# Patient Record
Sex: Female | Born: 1945 | Race: White | Hispanic: No | Smoking: Former smoker
Health system: Southern US, Community
[De-identification: ages and names within clinical notes are randomized; demographics above are authoritative.]

## PROBLEM LIST (undated history)

## (undated) DIAGNOSIS — M810 Age-related osteoporosis without current pathological fracture: Secondary | ICD-10-CM

## (undated) DIAGNOSIS — Z87891 Personal history of nicotine dependence: Secondary | ICD-10-CM

## (undated) DIAGNOSIS — E559 Vitamin D deficiency, unspecified: Secondary | ICD-10-CM

## (undated) DIAGNOSIS — Z780 Asymptomatic menopausal state: Secondary | ICD-10-CM

## (undated) DIAGNOSIS — F319 Bipolar disorder, unspecified: Secondary | ICD-10-CM

## (undated) HISTORY — DX: Age-related osteoporosis without current pathological fracture: M81.0

## (undated) HISTORY — DX: Vitamin D deficiency, unspecified: E55.9

## (undated) HISTORY — DX: Bipolar disorder, unspecified: F31.9

## (undated) HISTORY — DX: Asymptomatic menopausal state: Z78.0

## (undated) HISTORY — PX: TUBAL LIGATION: SHX77

## (undated) HISTORY — DX: Personal history of nicotine dependence: Z87.891

---

## 1993-03-09 DIAGNOSIS — F319 Bipolar disorder, unspecified: Secondary | ICD-10-CM

## 1993-03-09 HISTORY — DX: Bipolar disorder, unspecified: F31.9

## 1999-03-10 DIAGNOSIS — Z87891 Personal history of nicotine dependence: Secondary | ICD-10-CM

## 1999-03-10 HISTORY — DX: Personal history of nicotine dependence: Z87.891

## 2011-06-23 DIAGNOSIS — N390 Urinary tract infection, site not specified: Secondary | ICD-10-CM | POA: Diagnosis not present

## 2011-07-30 DIAGNOSIS — F339 Major depressive disorder, recurrent, unspecified: Secondary | ICD-10-CM | POA: Diagnosis not present

## 2011-07-30 DIAGNOSIS — F3130 Bipolar disorder, current episode depressed, mild or moderate severity, unspecified: Secondary | ICD-10-CM | POA: Diagnosis not present

## 2011-07-31 DIAGNOSIS — F309 Manic episode, unspecified: Secondary | ICD-10-CM | POA: Diagnosis not present

## 2012-03-31 DIAGNOSIS — F3174 Bipolar disorder, in full remission, most recent episode manic: Secondary | ICD-10-CM | POA: Diagnosis not present

## 2012-05-05 DIAGNOSIS — F3174 Bipolar disorder, in full remission, most recent episode manic: Secondary | ICD-10-CM | POA: Diagnosis not present

## 2012-05-20 DIAGNOSIS — F3174 Bipolar disorder, in full remission, most recent episode manic: Secondary | ICD-10-CM | POA: Diagnosis not present

## 2012-06-20 DIAGNOSIS — F3174 Bipolar disorder, in full remission, most recent episode manic: Secondary | ICD-10-CM | POA: Diagnosis not present

## 2012-08-11 DIAGNOSIS — F3174 Bipolar disorder, in full remission, most recent episode manic: Secondary | ICD-10-CM | POA: Diagnosis not present

## 2012-09-07 DIAGNOSIS — F3174 Bipolar disorder, in full remission, most recent episode manic: Secondary | ICD-10-CM | POA: Diagnosis not present

## 2012-12-30 DIAGNOSIS — F3174 Bipolar disorder, in full remission, most recent episode manic: Secondary | ICD-10-CM | POA: Diagnosis not present

## 2013-01-10 DIAGNOSIS — F3174 Bipolar disorder, in full remission, most recent episode manic: Secondary | ICD-10-CM | POA: Diagnosis not present

## 2013-01-10 LAB — COMPREHENSIVE METABOLIC PANEL
ALT: 15 U/L (ref 7–35)
AST: 13 U/L
Albumin: 4.5
Alkaline Phosphatase: 73 U/L
Calcium: 10.5 mg/dL
Creat: 0.88
GLUCOSE: 98
Potassium: 4.8 mmol/L
SODIUM: 140 mmol/L (ref 137–147)
Total Bilirubin: 0.4 mg/dL

## 2013-01-10 LAB — CBC
HEMOGLOBIN: 13.7 g/dL
WBC: 8.5
platelet count: 391

## 2013-01-10 LAB — LITHIUM LEVEL: Lithium Lvl: 1

## 2013-01-10 LAB — T4: Thyroxine (T4): 8.4

## 2013-01-10 LAB — TSH: TSH: 4.77

## 2013-01-13 DIAGNOSIS — Z23 Encounter for immunization: Secondary | ICD-10-CM | POA: Diagnosis not present

## 2013-03-09 DIAGNOSIS — M810 Age-related osteoporosis without current pathological fracture: Secondary | ICD-10-CM

## 2013-03-09 HISTORY — DX: Age-related osteoporosis without current pathological fracture: M81.0

## 2013-03-24 DIAGNOSIS — F3174 Bipolar disorder, in full remission, most recent episode manic: Secondary | ICD-10-CM | POA: Diagnosis not present

## 2013-03-27 ENCOUNTER — Encounter: Payer: Self-pay | Admitting: Family Medicine

## 2013-05-16 ENCOUNTER — Encounter: Payer: Self-pay | Admitting: Family Medicine

## 2013-05-16 ENCOUNTER — Ambulatory Visit (INDEPENDENT_AMBULATORY_CARE_PROVIDER_SITE_OTHER): Payer: Medicare Other | Admitting: Family Medicine

## 2013-05-16 VITALS — BP 124/86 | HR 84 | Temp 98.8°F | Ht 65.0 in | Wt 152.8 lb

## 2013-05-16 DIAGNOSIS — M810 Age-related osteoporosis without current pathological fracture: Secondary | ICD-10-CM | POA: Diagnosis not present

## 2013-05-16 DIAGNOSIS — R946 Abnormal results of thyroid function studies: Secondary | ICD-10-CM | POA: Diagnosis not present

## 2013-05-16 DIAGNOSIS — Z87891 Personal history of nicotine dependence: Secondary | ICD-10-CM | POA: Diagnosis not present

## 2013-05-16 DIAGNOSIS — F3174 Bipolar disorder, in full remission, most recent episode manic: Secondary | ICD-10-CM | POA: Insufficient documentation

## 2013-05-16 DIAGNOSIS — F319 Bipolar disorder, unspecified: Secondary | ICD-10-CM

## 2013-05-16 LAB — T4, FREE: FREE T4: 0.75 ng/dL (ref 0.60–1.60)

## 2013-05-16 LAB — TSH: TSH: 1.31 u[IU]/mL (ref 0.35–5.50)

## 2013-05-16 NOTE — Assessment & Plan Note (Signed)
Recheck levels today.  Possibly lithium related.

## 2013-05-16 NOTE — Progress Notes (Signed)
Pre visit review using our clinic review tool, if applicable. No additional management support is needed unless otherwise documented below in the visit note. 

## 2013-05-16 NOTE — Patient Instructions (Addendum)
Good to meet you today, call us with questions. Return in 3-4 months for wellness exam, prior fasting for blood work. Blood work today - to follow thyroid and calcium levels.  Try to get most or all of your calcium from your food--aim for 1000 mg/day for women up to 50 and men up to 70 and 1200 mg/day for women over 50 and men over 70. To figure out dietary calcium: 300 mg/day from all non dairy foods plus 300 mg per cup of milk, other dairy, or fortified juice. Non dairy foods that contain calcium: Kale, oranges, sardines, oatmeal, soy milk/soybeans, salmon, white beans, dried figs, turnip greens, almonds, broccoli, tofu.

## 2013-05-16 NOTE — Assessment & Plan Note (Signed)
Reviewed rec cal/vit D daily. Pt hesitant for meds to treat osteoporosis 2/2 side effects. Will obtain latest dexa done ~2010 and further discuss next OV.

## 2013-05-16 NOTE — Progress Notes (Signed)
BP 124/86  Pulse 84  Temp(Src) 98.8 F (37.1 C) (Oral)  Ht 5\' 5"  (1.651 m)  Wt 152 lb 12.8 oz (69.31 kg)  BMI 25.43 kg/m2   CC: new pt to establish   Subjective:    Patient ID: Rachel Macias, female    DOB: [birth year], 68 y.o.   MRN: [id number]  HPI: Rachel Macias is a 68 y.o. female presenting on 05/16/2013 with Establish Care   Prior saw psych Dr. Blaine HamperWeinstein in [place] for 20 years - was on cytomel per him.  Then recently started seeing Dr. Maryruth BunKapur fall 2013.  H/o bipolar 1 disorder on lithium and prozac.  Q6 mo blood work.  Denies diarrhea/constipatio, skin or hair changes, heat or cold intolerance, unexpected weight changes.  No HTN.  Osteoporosis - prior on boniva x1 year.  Did not tolerate fosamax 2/2 GI distress.  Last DEXA ~2010.  Husband passed away of brain cancer 2010 - she was sole caregiver for 18 months.  Lives alone with 2 dogs, widow 30 yrs ago son died at age 68yo (hit by car). 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  Allergies and medications reviewed and updated. No current outpatient prescriptions on file prior to visit.   No current facility-administered medications on file prior to visit.    Review of Systems Per HPI unless specifically indicated above    Objective:    BP 124/86  Pulse 84  Temp(Src) 98.8 F (37.1 C) (Oral)  Ht 5\' 5"  (1.651 m)  Wt 152 lb 12.8 oz (69.31 kg)  BMI 25.43 kg/m2  Physical Exam  Nursing note and vitals reviewed. Constitutional: She is oriented to person, place, and time. She appears well-developed and well-nourished. No distress.  HENT:  Head: Normocephalic and  atraumatic.  Right Ear: Hearing, tympanic membrane and ear canal normal.  Left Ear: Hearing, tympanic membrane and ear canal normal.  Mouth/Throat: Uvula is midline, oropharynx is clear and moist and mucous membranes are normal. No oropharyngeal exudate, posterior oropharyngeal edema or posterior oropharyngeal erythema.  Eyes: Conjunctivae and EOM are normal. Pupils are equal, round, and reactive to light. No scleral icterus.  Neck: Normal range of motion. Neck supple. No thyromegaly present.  Cardiovascular: Normal rate, regular rhythm, normal heart sounds and intact distal pulses.   No murmur heard. Pulses:      Radial pulses are 2+ on the right side, and 2+ on the left side.  Pulmonary/Chest: Effort normal and breath sounds normal. No respiratory distress. She has no wheezes. She has no rales.  Musculoskeletal: Normal range of motion. She exhibits no edema.  Lymphadenopathy:    She has no cervical adenopathy.  Neurological: She is alert and oriented to person, place, and time.  CN grossly intact, station and gait intact  Skin: Skin is warm and dry. No rash noted.  Psychiatric: She has a normal mood and affect. Her behavior is normal. Judgment and thought content normal.   No results found for this or any previous visit.    Assessment & Plan:   Problem List Items Addressed This Visit   Bipolar 1 disorder     Chronic, stable. Continue to f/u with psych Maryruth Bun([place]).  Ex-smoker   Osteoporosis, unspecified     Reviewed rec cal/vit D daily. Pt hesitant for meds to treat osteoporosis 2/2 side effects. Will obtain latest dexa done ~2010 and further discuss next OV.    Relevant Medications      cholecalciferol (VITAMIN D) 1000 UNITS tablet   Other Relevant Orders      Vit D  25 hydroxy (rtn osteoporosis monitoring)   Thyroid function test abnormal - Primary     Recheck levels today.  Possibly lithium related.    Relevant Orders      TSH      T4, Free      T3    Other Visit  Diagnoses   Hypercalcemia        Relevant Orders       PTH, Intact and Calcium        Follow up plan: No Follow-up on file.

## 2013-05-16 NOTE — Assessment & Plan Note (Signed)
Chronic, stable. Continue to f/u with psych Maryruth Bun([place]).

## 2013-05-17 LAB — PTH, INTACT AND CALCIUM
Calcium: 9.6 mg/dL (ref 8.4–10.5)
PTH: 110.4 pg/mL — AB (ref 14.0–72.0)

## 2013-05-17 LAB — T3: T3, Total: 122 ng/dL (ref 80.0–204.0)

## 2013-05-17 LAB — VITAMIN D 25 HYDROXY (VIT D DEFICIENCY, FRACTURES): Vit D, 25-Hydroxy: 21 ng/mL — ABNORMAL LOW (ref 30–89)

## 2013-05-18 ENCOUNTER — Other Ambulatory Visit: Payer: Self-pay | Admitting: Family Medicine

## 2013-05-18 ENCOUNTER — Encounter: Payer: Self-pay | Admitting: Family Medicine

## 2013-05-18 ENCOUNTER — Encounter: Payer: Self-pay | Admitting: *Deleted

## 2013-05-18 DIAGNOSIS — E559 Vitamin D deficiency, unspecified: Secondary | ICD-10-CM

## 2013-05-18 MED ORDER — VITAMIN D 1000 UNITS PO TABS: 2000.0000 [IU] | ORAL_TABLET | Freq: Every day | ORAL | Status: AC

## 2013-06-26 ENCOUNTER — Encounter: Payer: Self-pay | Admitting: *Deleted

## 2013-07-05 DIAGNOSIS — F3174 Bipolar disorder, in full remission, most recent episode manic: Secondary | ICD-10-CM | POA: Diagnosis not present

## 2013-07-06 ENCOUNTER — Encounter: Payer: Self-pay | Admitting: Family Medicine

## 2013-08-12 ENCOUNTER — Other Ambulatory Visit: Payer: Self-pay | Admitting: Family Medicine

## 2013-08-12 DIAGNOSIS — F319 Bipolar disorder, unspecified: Secondary | ICD-10-CM

## 2013-08-12 DIAGNOSIS — Z136 Encounter for screening for cardiovascular disorders: Secondary | ICD-10-CM

## 2013-08-12 DIAGNOSIS — R946 Abnormal results of thyroid function studies: Secondary | ICD-10-CM

## 2013-08-12 DIAGNOSIS — E559 Vitamin D deficiency, unspecified: Secondary | ICD-10-CM

## 2013-08-15 ENCOUNTER — Other Ambulatory Visit (INDEPENDENT_AMBULATORY_CARE_PROVIDER_SITE_OTHER): Payer: Medicare Other

## 2013-08-15 DIAGNOSIS — F319 Bipolar disorder, unspecified: Secondary | ICD-10-CM

## 2013-08-15 DIAGNOSIS — M810 Age-related osteoporosis without current pathological fracture: Secondary | ICD-10-CM | POA: Diagnosis not present

## 2013-08-15 DIAGNOSIS — E559 Vitamin D deficiency, unspecified: Secondary | ICD-10-CM

## 2013-08-15 DIAGNOSIS — R946 Abnormal results of thyroid function studies: Secondary | ICD-10-CM

## 2013-08-15 DIAGNOSIS — Z136 Encounter for screening for cardiovascular disorders: Secondary | ICD-10-CM

## 2013-08-15 LAB — LIPID PANEL
CHOL/HDL RATIO: 7
CHOLESTEROL: 284 mg/dL — AB (ref 0–200)
HDL: 39.8 mg/dL (ref >39.00)
LDL Cholesterol: 202 mg/dL — ABNORMAL HIGH (ref 0–99)
NonHDL: 244.2
Triglycerides: 211 mg/dL — ABNORMAL HIGH (ref 0.0–149.0)
VLDL: 42.2 mg/dL — ABNORMAL HIGH (ref 0.0–40.0)

## 2013-08-15 LAB — BASIC METABOLIC PANEL
BUN: 13 mg/dL (ref 6–23)
CHLORIDE: 107 meq/L (ref 96–112)
CO2: 26 meq/L (ref 19–32)
CREATININE: 0.9 mg/dL (ref 0.4–1.2)
Calcium: 9.7 mg/dL (ref 8.4–10.5)
GFR: 62.95 mL/min (ref >60.00)
GLUCOSE: 95 mg/dL (ref 70–99)
Potassium: 4.2 mEq/L (ref 3.5–5.1)
Sodium: 140 mEq/L (ref 135–145)

## 2013-08-15 LAB — VITAMIN D 25 HYDROXY (VIT D DEFICIENCY, FRACTURES): VITD: 29.95 ng/mL

## 2013-08-15 NOTE — Addendum Note (Signed)
Addended by: Alvina Chou on: 08/15/2013 02:17 PM   Modules accepted: Orders

## 2013-08-18 LAB — LITHIUM LEVEL: Lithium Lvl: 0.7 mEq/L — ABNORMAL LOW (ref 0.80–1.40)

## 2013-08-22 ENCOUNTER — Encounter: Payer: Self-pay | Admitting: Family Medicine

## 2013-08-22 ENCOUNTER — Ambulatory Visit (INDEPENDENT_AMBULATORY_CARE_PROVIDER_SITE_OTHER): Payer: Medicare Other | Admitting: Family Medicine

## 2013-08-22 VITALS — BP 124/82 | HR 56 | Temp 98.7°F | Ht 65.0 in | Wt 149.5 lb

## 2013-08-22 DIAGNOSIS — Z1211 Encounter for screening for malignant neoplasm of colon: Secondary | ICD-10-CM | POA: Diagnosis not present

## 2013-08-22 DIAGNOSIS — Z1231 Encounter for screening mammogram for malignant neoplasm of breast: Secondary | ICD-10-CM | POA: Diagnosis not present

## 2013-08-22 DIAGNOSIS — F319 Bipolar disorder, unspecified: Secondary | ICD-10-CM

## 2013-08-22 DIAGNOSIS — Z0000 Encounter for general adult medical examination without abnormal findings: Secondary | ICD-10-CM | POA: Diagnosis not present

## 2013-08-22 DIAGNOSIS — Z23 Encounter for immunization: Secondary | ICD-10-CM | POA: Diagnosis not present

## 2013-08-22 DIAGNOSIS — M810 Age-related osteoporosis without current pathological fracture: Secondary | ICD-10-CM

## 2013-08-22 DIAGNOSIS — R946 Abnormal results of thyroid function studies: Secondary | ICD-10-CM

## 2013-08-22 NOTE — Assessment & Plan Note (Signed)
Lab Results  Component Value Date   TSH 1.31 05/16/2013

## 2013-08-22 NOTE — Progress Notes (Signed)
BP 124/82  Pulse 56  Temp(Src) 98.7 F (37.1 C) (Oral)  Ht '5\' 5"'  (1.651 m)  Wt 149 lb 8 oz (67.813 kg)  BMI 24.88 kg/m2   CC: medicare wellness visit  Subjective:    Patient ID: Rachel Macias, female    DOB: 05-Apr-1945, 68 y.o.   MRN: [id number]  HPI: Alyona Romack is a 68 y.o. female presenting on 08/22/2013 for Annual Exam   Prior doctor was NP at [facility] in [place].  H/o bipolar 1 disorder on lithium and prozac. Failed transition of litium to [place] continued lithium use. Q6 mo blood work.   Osteoporosis - prior on boniva x1 year. Did not tolerate fosamax 2/2 GI distress. Last DEXA <2010.   Hearing screen passed. Vision screen passed. No falls in last year Denies depression, anhedonia.  Preventative:  Colon cancer screening - no fmhx colon cancer. Discussed this. Hesitant for colonoscopy. interested in stool kit. 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 Would like to schedule at our office in future. May do one more then age out.   mammo 2000 - would like scheduled DEXA 05-26-2008 - would like scheduled Flu shot this year done prevnar - today. zostavax - discussed G1P1 (son died at age 58yo), LMP 39 (age 32) Advanced directives - Has at home. HCPOA not set up, would consider sister.  Husband passed away of brain cancer May 26, 2008 - she was sole caregiver for 18 months.  Lives alone with 2 dogs, widow  50 yrs ago son died at age 52yo (hit by car).  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  Occupation: retired, used to work for [employer] ([place])  Edu: 13 yrs  Activity: no regular exercise  Diet: good water, fruits/vegetables some   Relevant past medical, surgical, family and social history reviewed and updated as indicated.  Allergies and medications reviewed and updated. Current Outpatient Prescriptions on File Prior to Visit  Medication Sig  . cholecalciferol (VITAMIN D) 1000 UNITS tablet Take 2 tablets (2,000 Units  total) by mouth daily.  Marland Kitchen FLUoxetine (PROZAC) 20 MG capsule Take 20 mg by mouth daily.  Marland Kitchen lithium 300 MG tablet Take 900 mg by mouth daily.   No current facility-administered medications on file prior to visit.    Review of Systems Per HPI unless specifically indicated above    Objective:    BP 124/82  Pulse 56  Temp(Src) 98.7 F (37.1 C) (Oral)  Ht '5\' 5"'  (1.651 m)  Wt 149 lb 8 oz (67.813 kg)  BMI 24.88 kg/m2  Physical Exam  Nursing note and vitals reviewed. Constitutional: She is oriented to person, place, and time. She appears well-developed and well-nourished. No distress.  HENT:  Head: Normocephalic and atraumatic.  Right Ear: Hearing, tympanic membrane, external ear and ear canal normal.  Left Ear: Hearing, tympanic membrane, external ear and ear canal normal.  Nose: Nose normal.  Mouth/Throat: Uvula is midline, oropharynx is clear and moist and mucous membranes are normal. No oropharyngeal exudate, posterior oropharyngeal edema or posterior oropharyngeal erythema.  Eyes: Conjunctivae and EOM are normal. Pupils are equal, round, and reactive to light. No scleral icterus.  Neck: Normal range of motion. Neck supple. Carotid bruit is not present. No thyromegaly present.  Cardiovascular: Normal rate, regular rhythm, normal heart sounds and intact distal pulses.   No murmur heard. Pulses:      Radial pulses are 2+ on the right side, and 2+ on the left  side.  Pulmonary/Chest: Effort normal and breath sounds normal. No respiratory distress. She has no wheezes. She has no rales.  Abdominal: Soft. Bowel sounds are normal. She exhibits no distension and no mass. There is no tenderness. There is no rebound and no guarding.  Musculoskeletal: Normal range of motion. She exhibits no edema.  Lymphadenopathy:    She has no cervical adenopathy.  Neurological: She is alert and oriented to person, place, and time.  CN grossly intact, station and gait intact Recall 3/3 Calculation 5/5  serial 7s  Skin: Skin is warm and dry. No rash noted.  Psychiatric: She has a normal mood and affect. Her behavior is normal. Judgment and thought content normal.   Results for orders placed in visit on 08/15/13  LIPID PANEL      Result Value Ref Range   Cholesterol 284 (*) 0 - 200 mg/dL   Triglycerides 211.0 (*) 0.0 - 149.0 mg/dL   HDL 39.80  >39.00 mg/dL   VLDL 42.2 (*) 0.0 - 40.0 mg/dL   LDL Cholesterol 202 (*) 0 - 99 mg/dL   Total CHOL/HDL Ratio 7     NonHDL 735.32    BASIC METABOLIC PANEL      Result Value Ref Range   Sodium 140  135 - 145 mEq/L   Potassium 4.2  3.5 - 5.1 mEq/L   Chloride 107  96 - 112 mEq/L   CO2 26  19 - 32 mEq/L   Glucose, Bld 95  70 - 99 mg/dL   BUN 13  6 - 23 mg/dL   Creatinine, Ser 0.9  0.4 - 1.2 mg/dL   Calcium 9.7  8.4 - 10.5 mg/dL   GFR 62.95  >60.00 mL/min  VITAMIN D 25 HYDROXY      Result Value Ref Range   VITD 29.95    LITHIUM LEVEL      Result Value Ref Range   Lithium Lvl 0.70 (*) 0.80 - 1.40 mEq/L      Assessment & Plan:   Problem List Items Addressed This Visit   Thyroid function test abnormal      Lab Results  Component Value Date   TSH 1.31 05/16/2013      Osteoporosis, unspecified     Will schedule DEXA Continue vit D daily.    Relevant Orders      DG Bone Density   Medicare annual wellness visit, subsequent - Primary     I have personally reviewed the Medicare Annual Wellness questionnaire and have noted 1. The patient's medical and social history 2. Their use of alcohol, tobacco or illicit drugs 3. Their current medications and supplements 4. The patient's functional ability including ADL's, fall risks, home safety risks and hearing or visual impairment. 5. Diet and physical activity 6. Evidence for depression or mood disorders The patients weight, height, BMI have been recorded in the chart.  Hearing and vision has been addressed. I have made referrals, counseling and provided education to the patient based review of  the above and I have provided the pt with a written personalized care plan for preventive services. See scanned questionairre. Advanced directives discussed: has at home, thinks would want sister to be HCPOA but not formalized.  Reviewed preventative protocols and updated unless pt declined. Pt will return for well woman exam at her convenience. iFOB today.    Bipolar 1 disorder     Stable on lithium and prozac.  continue meds. Continue f/u with Dr. Nicolasa Ducking. Lithium level 0.7 today - good  range.     Other Visit Diagnoses   Other screening mammogram        Relevant Orders       MM DIGITAL SCREENING BILATERAL    Special screening for malignant neoplasms, colon        Relevant Orders       Fecal occult blood, imunochemical        Follow up plan: Return in about 1 year (around 08/23/2014), or as needed, for annual exam, prior fasting for blood work.

## 2013-08-22 NOTE — Addendum Note (Signed)
Addended by: Josph MachoANCE, KIMBERLY A on: 08/22/2013 11:35 AM   Modules accepted: Orders

## 2013-08-22 NOTE — Assessment & Plan Note (Addendum)
I have personally reviewed the Medicare Annual Wellness questionnaire and have noted 1. The patient's medical and social history 2. Their use of alcohol, tobacco or illicit drugs 3. Their current medications and supplements 4. The patient's functional ability including ADL's, fall risks, home safety risks and hearing or visual impairment. 5. Diet and physical activity 6. Evidence for depression or mood disorders The patients weight, height, BMI have been recorded in the chart.  Hearing and vision has been addressed. I have made referrals, counseling and provided education to the patient based review of the above and I have provided the pt with a written personalized care plan for preventive services. See scanned questionairre. Advanced directives discussed: has at home, thinks would want sister to be HCPOA but not formalized.  Reviewed preventative protocols and updated unless pt declined. Pt will return for well woman exam at her convenience. iFOB today.

## 2013-08-22 NOTE — Progress Notes (Signed)
Pre visit review using our clinic review tool, if applicable. No additional management support is needed unless otherwise documented below in the visit note. 

## 2013-08-22 NOTE — Assessment & Plan Note (Signed)
Stable on lithium and prozac.  continue meds. Continue f/u with Dr. Maryruth BunKapur. Lithium level 0.7 today - good range.

## 2013-08-22 NOTE — Assessment & Plan Note (Signed)
Will schedule DEXA Continue vit D daily.

## 2013-08-22 NOTE — Patient Instructions (Addendum)
Prevnar today. Low cholesterol diet handout provided today Call your insurance about the shingles shot to see if it is covered or how much it would cost and where is cheaper (here or pharmacy).  If you want to receive here, call for nurse visit. Sign release form for [facility] so we can get copies of bone density scan. We will schedule you for a mammogram and bone density scan (pass by Marion's office if she's available today). Stool kit today. Schedule well woman exam at your convenience. Good to see you today, call us with questions.

## 2013-09-03 ENCOUNTER — Encounter: Payer: Self-pay | Admitting: Family Medicine

## 2013-09-04 ENCOUNTER — Other Ambulatory Visit: Payer: Medicare Other

## 2013-09-04 DIAGNOSIS — Z1211 Encounter for screening for malignant neoplasm of colon: Secondary | ICD-10-CM

## 2013-09-04 LAB — FECAL OCCULT BLOOD, IMMUNOCHEMICAL: FECAL OCCULT BLD: NEGATIVE

## 2013-09-05 ENCOUNTER — Encounter: Payer: Self-pay | Admitting: *Deleted

## 2013-09-06 HISTORY — PX: OTHER SURGICAL HISTORY: SHX169

## 2013-09-14 ENCOUNTER — Ambulatory Visit: Payer: Self-pay | Admitting: Family Medicine

## 2013-09-14 ENCOUNTER — Encounter: Payer: Self-pay | Admitting: Family Medicine

## 2013-09-14 DIAGNOSIS — M899 Disorder of bone, unspecified: Secondary | ICD-10-CM | POA: Diagnosis not present

## 2013-09-14 DIAGNOSIS — Z1231 Encounter for screening mammogram for malignant neoplasm of breast: Secondary | ICD-10-CM | POA: Diagnosis not present

## 2013-09-14 DIAGNOSIS — M949 Disorder of cartilage, unspecified: Secondary | ICD-10-CM | POA: Diagnosis not present

## 2013-09-14 DIAGNOSIS — M810 Age-related osteoporosis without current pathological fracture: Secondary | ICD-10-CM | POA: Diagnosis not present

## 2013-09-14 LAB — HM MAMMOGRAPHY: HM MAMMO: NORMAL

## 2013-09-15 ENCOUNTER — Encounter: Payer: Self-pay | Admitting: *Deleted

## 2013-09-17 ENCOUNTER — Encounter: Payer: Self-pay | Admitting: Family Medicine

## 2013-09-22 ENCOUNTER — Encounter: Payer: Self-pay | Admitting: Family Medicine

## 2013-09-22 ENCOUNTER — Ambulatory Visit (INDEPENDENT_AMBULATORY_CARE_PROVIDER_SITE_OTHER): Payer: Medicare Other | Admitting: Family Medicine

## 2013-09-22 VITALS — BP 126/84 | HR 72 | Temp 98.7°F | Wt 151.5 lb

## 2013-09-22 DIAGNOSIS — F319 Bipolar disorder, unspecified: Secondary | ICD-10-CM | POA: Diagnosis not present

## 2013-09-22 DIAGNOSIS — M810 Age-related osteoporosis without current pathological fracture: Secondary | ICD-10-CM | POA: Diagnosis not present

## 2013-09-22 NOTE — Progress Notes (Signed)
Pre visit review using our clinic review tool, if applicable. No additional management support is needed unless otherwise documented below in the visit note. 

## 2013-09-22 NOTE — Assessment & Plan Note (Addendum)
Discussed concerns with mental healthcare. Support provided. A total of 25 minutes were spent face-to-face with the patient during this encounter and over half of that time was spent on counseling and coordination of care

## 2013-09-22 NOTE — Assessment & Plan Note (Signed)
Completed ~1 mo bisphosphonate therapy. Pending dental work. Will defer osteoporosis treatment until completed dental work then pt would consider further treatment. Discussed different options - pt desires to avoid oral bisphosphonates or any hormonal treatments. Not interested in daily injection. Would consider prolia or IV bsiphosphonate. Reviewed calcium/vit D recommendations and reviewed dietary ways to reach goal. Requests to further discuss at future visits.

## 2013-09-22 NOTE — Patient Instructions (Signed)
Check on dose of calcium in antacids you take. Try to get most or all of your calcium from your food--aim for 1200 mg/day for women over 50 and men over 70. To figure out dietary calcium: 300 mg/day from all non dairy foods plus 300 mg per cup of milk, other dairy, or fortified juice. Non dairy foods that contain calcium: Kale, oranges, sardines, oatmeal, soy milk/soybeans, salmon, white beans, dried figs, turnip greens, almonds, broccoli, tofu.  If we're not reaching calcium goal per day in diet, would consider adding calcium supplement (Calcium citrate) 600mg  + vit D 400 units.  Look into chewable calcium supplements.  Check with dentist about prolia.   Osteoporosis Throughout your life, your body breaks down old bone and replaces it with new bone. As you get older, your body does not replace bone as quickly as it breaks it down. By the age of 30 years, most people begin to gradually lose bone because of the imbalance between bone loss and replacement. Some people lose more bone than others. Bone loss beyond a specified normal degree is considered osteoporosis.  Osteoporosis affects the strength and durability of your bones. The inside of the ends of your bones and your flat bones, like the bones of your pelvis, look like honeycomb, filled with tiny open spaces. As bone loss occurs, your bones become less dense. This means that the open spaces inside your bones become bigger and the walls between these spaces become thinner. This makes your bones weaker. Bones of a person with osteoporosis can become so weak that they can break (fracture) during minor accidents, such as a simple fall. CAUSES  The following factors have been associated with the development of osteoporosis:  Smoking.  Drinking more than 2 alcoholic drinks several days per week.  Long-term use of certain medicines:  Corticosteroids.  Chemotherapy medicines.  Thyroid medicines.  Antiepileptic medicines.  Gonadal  hormone suppression medicine.  Immunosuppression medicine.  Being underweight.  Lack of physical activity.  Lack of exposure to the sun. This can lead to vitamin D deficiency.  Certain medical conditions:  Certain inflammatory bowel diseases, such as Crohn disease and ulcerative colitis.  Diabetes.  Hyperthyroidism.  Hyperparathyroidism. RISK FACTORS Anyone can develop osteoporosis. However, the following factors can increase your risk of developing osteoporosis:  Gender--Women are at higher risk than men.  Age--Being older than 50 years increases your risk.  Ethnicity--White and Asian people have an increased risk.  Weight --Being extremely underweight can increase your risk of osteoporosis.  Family history of osteoporosis--Having a family member who has developed osteoporosis can increase your risk. SYMPTOMS  Usually, people with osteoporosis have no symptoms.  DIAGNOSIS  Signs during a physical exam that may prompt your caregiver to suspect osteoporosis include:  Decreased height. This is usually caused by the compression of the bones that form your spine (vertebrae) because they have weakened and become fractured.  A curving or rounding of the upper back (kyphosis). To confirm signs of osteoporosis, your caregiver may request a procedure that uses 2 low-dose X-ray beams with different levels of energy to measure your bone mineral density (dual-energy X-ray absorptiometry [DXA]). Also, your caregiver may check your level of vitamin D. TREATMENT  The goal of osteoporosis treatment is to strengthen bones in order to decrease the risk of bone fractures. There are different types of medicines available to help achieve this goal. Some of these medicines work by slowing the processes of bone loss. Some medicines work by increasing bone density.  Treatment also involves making sure that your levels of calcium and vitamin D are adequate. PREVENTION  There are things you can do to  help prevent osteoporosis. Adequate intake of calcium and vitamin D can help you achieve optimal bone mineral density. Regular exercise can also help, especially resistance and weight-bearing activities. If you smoke, quitting smoking is an important part of osteoporosis prevention. MAKE SURE YOU:  Understand these instructions.  Will watch your condition.  Will get help right away if you are not doing well or get worse. FOR MORE INFORMATION www.osteo.org and [employer] Document Released: 12/03/2004 Document Revised: 06/20/2012 Document Reviewed: 02/07/2011 [facility] Patient Information 2015 [place], [place]. This information is not intended to replace advice given to you by your health care provider. Make sure you discuss any questions you have with your health care provider.

## 2013-09-22 NOTE — Progress Notes (Signed)
   BP 126/84  Pulse 72  Temp(Src) 98.7 F (37.1 C) (Oral)  Wt 151 lb 8 oz (68.72 kg)   CC: discuss osteoporosis  Subjective:    Patient ID: Stephannie PetersJane Wurth, female    DOB: [birth year], 68 y.o.   MRN: [id number]  HPI: Stephannie PetersJane Roughton is a 68 y.o. female presenting on 09/22/2013 for Follow-up   Presents today to discuss osteoporosis options.  Osteoporosis - h/o fosamax GI reaction, then was on boniva IV Q4090mo for ~1 yr.  Doesn't remember dates of first DEXA or of treatment dates.  DEXA Date: 09/2013 T -2.8 femur.   Dental work planned in next 2 weeks so hesitant for any bisphosphonate.   Compliant with vit D 1000 IU daily.  Last vit d check 29.95 (08/2013).   Also discussed mental healthcare concerns.  Relevant past medical, surgical, family and social history reviewed and updated as indicated.  Allergies and medications reviewed and updated. Current Outpatient Prescriptions on File Prior to Visit  Medication Sig  . cholecalciferol (VITAMIN D) 1000 UNITS tablet Take 2 tablets (2,000 Units total) by mouth daily.  Marland Kitchen. FLUoxetine (PROZAC) 20 MG capsule Take 20 mg by mouth daily.  Marland Kitchen. lithium 300 MG tablet Take 900 mg by mouth daily.   No current facility-administered medications on file prior to visit.    Review of Systems Per HPI unless specifically indicated above    Objective:    BP 126/84  Pulse 72  Temp(Src) 98.7 F (37.1 C) (Oral)  Wt 151 lb 8 oz (68.72 kg)  Physical Exam  Nursing note and vitals reviewed. Constitutional: She appears well-developed and well-nourished. No distress.  Psychiatric: She has a normal mood and affect.       Assessment & Plan:   Problem List Items Addressed This Visit   Osteoporosis, unspecified - Primary     Completed ~1 mo bisphosphonate therapy. Pending dental work. Will defer osteoporosis treatment until completed dental work then pt would consider further treatment. Discussed different options - pt desires to avoid oral bisphosphonates or any  hormonal treatments. Not interested in daily injection. Would consider prolia or IV bsiphosphonate. Reviewed calcium/vit D recommendations and reviewed dietary ways to reach goal. Requests to further discuss at future visits.    Bipolar 1 disorder     Discussed concerns with mental healthcare. Support provided. A total of 25 minutes were spent face-to-face with the patient during this encounter and over half of that time was spent on counseling and coordination of care         Follow up plan: Return as needed.

## 2013-09-28 DIAGNOSIS — F3174 Bipolar disorder, in full remission, most recent episode manic: Secondary | ICD-10-CM | POA: Diagnosis not present

## 2013-12-28 DIAGNOSIS — F3174 Bipolar disorder, in full remission, most recent episode manic: Secondary | ICD-10-CM | POA: Diagnosis not present

## 2014-03-06 DIAGNOSIS — F3174 Bipolar disorder, in full remission, most recent episode manic: Secondary | ICD-10-CM | POA: Diagnosis not present

## 2014-03-20 DIAGNOSIS — F3174 Bipolar disorder, in full remission, most recent episode manic: Secondary | ICD-10-CM | POA: Diagnosis not present

## 2014-06-25 DIAGNOSIS — F3174 Bipolar disorder, in full remission, most recent episode manic: Secondary | ICD-10-CM | POA: Diagnosis not present

## 2014-07-02 ENCOUNTER — Encounter: Payer: Self-pay | Admitting: Family Medicine

## 2014-08-27 ENCOUNTER — Other Ambulatory Visit: Payer: Self-pay | Admitting: Family Medicine

## 2014-08-27 ENCOUNTER — Other Ambulatory Visit (INDEPENDENT_AMBULATORY_CARE_PROVIDER_SITE_OTHER): Payer: Medicare Other

## 2014-08-27 DIAGNOSIS — E559 Vitamin D deficiency, unspecified: Secondary | ICD-10-CM

## 2014-08-27 DIAGNOSIS — F3174 Bipolar disorder, in full remission, most recent episode manic: Secondary | ICD-10-CM

## 2014-08-27 DIAGNOSIS — E213 Hyperparathyroidism, unspecified: Secondary | ICD-10-CM

## 2014-08-27 DIAGNOSIS — E785 Hyperlipidemia, unspecified: Secondary | ICD-10-CM

## 2014-08-27 DIAGNOSIS — R946 Abnormal results of thyroid function studies: Secondary | ICD-10-CM

## 2014-08-27 LAB — LIPID PANEL
Cholesterol: 257 mg/dL — ABNORMAL HIGH (ref 0–200)
HDL: 39.7 mg/dL (ref >39.00)
NONHDL: 217.3
Total CHOL/HDL Ratio: 6
Triglycerides: 224 mg/dL — ABNORMAL HIGH (ref 0.0–149.0)
VLDL: 44.8 mg/dL — AB (ref 0.0–40.0)

## 2014-08-27 LAB — BASIC METABOLIC PANEL
BUN: 13 mg/dL (ref 6–23)
CO2: 27 meq/L (ref 19–32)
CREATININE: 0.9 mg/dL (ref 0.40–1.20)
Calcium: 10.1 mg/dL (ref 8.4–10.5)
Chloride: 102 mEq/L (ref 96–112)
GFR: 65.98 mL/min (ref >60.00)
GLUCOSE: 112 mg/dL — AB (ref 70–99)
POTASSIUM: 4 meq/L (ref 3.5–5.1)
Sodium: 134 mEq/L — ABNORMAL LOW (ref 135–145)

## 2014-08-27 LAB — VITAMIN D 25 HYDROXY (VIT D DEFICIENCY, FRACTURES): VITD: 31.2 ng/mL (ref 30.00–100.00)

## 2014-08-27 LAB — TSH: TSH: 5.32 u[IU]/mL — AB (ref 0.35–4.50)

## 2014-08-27 LAB — LDL CHOLESTEROL, DIRECT: LDL DIRECT: 174 mg/dL

## 2014-08-28 LAB — PARATHYROID HORMONE, INTACT (NO CA): PTH: 66 pg/mL — ABNORMAL HIGH (ref 14–64)

## 2014-08-28 LAB — LITHIUM LEVEL: Lithium Lvl: 1.2 mEq/L (ref 0.80–1.40)

## 2014-08-29 ENCOUNTER — Encounter: Payer: Medicare Other | Admitting: Family Medicine

## 2014-08-31 ENCOUNTER — Encounter: Payer: Medicare Other | Admitting: Family Medicine

## 2014-09-14 ENCOUNTER — Ambulatory Visit (INDEPENDENT_AMBULATORY_CARE_PROVIDER_SITE_OTHER): Payer: Medicare Other | Admitting: Family Medicine

## 2014-09-14 ENCOUNTER — Encounter: Payer: Self-pay | Admitting: Family Medicine

## 2014-09-14 VITALS — BP 130/80 | HR 80 | Temp 98.2°F | Ht 65.0 in | Wt 149.8 lb

## 2014-09-14 DIAGNOSIS — F3174 Bipolar disorder, in full remission, most recent episode manic: Secondary | ICD-10-CM

## 2014-09-14 DIAGNOSIS — M810 Age-related osteoporosis without current pathological fracture: Secondary | ICD-10-CM

## 2014-09-14 DIAGNOSIS — R946 Abnormal results of thyroid function studies: Secondary | ICD-10-CM | POA: Diagnosis not present

## 2014-09-14 DIAGNOSIS — Z23 Encounter for immunization: Secondary | ICD-10-CM

## 2014-09-14 DIAGNOSIS — Z0000 Encounter for general adult medical examination without abnormal findings: Secondary | ICD-10-CM | POA: Diagnosis not present

## 2014-09-14 DIAGNOSIS — E785 Hyperlipidemia, unspecified: Secondary | ICD-10-CM

## 2014-09-14 DIAGNOSIS — Z7189 Other specified counseling: Secondary | ICD-10-CM

## 2014-09-14 DIAGNOSIS — E559 Vitamin D deficiency, unspecified: Secondary | ICD-10-CM

## 2014-09-14 DIAGNOSIS — Z1211 Encounter for screening for malignant neoplasm of colon: Secondary | ICD-10-CM

## 2014-09-14 NOTE — Addendum Note (Signed)
Addended by: Josph MachoANCE, Jobina Maita A on: 09/14/2014 04:07 PM   Modules accepted: Orders

## 2014-09-14 NOTE — Assessment & Plan Note (Addendum)
Advanced directives - does not have at home. Would want sister to be HCPOA. Packet provided today.

## 2014-09-14 NOTE — Assessment & Plan Note (Signed)
Slowly improving with treatment.

## 2014-09-14 NOTE — Assessment & Plan Note (Signed)

## 2014-09-14 NOTE — Progress Notes (Signed)
Pre visit review using our clinic review tool, if applicable. No additional management support is needed unless otherwise documented below in the visit note. 

## 2014-09-14 NOTE — Assessment & Plan Note (Addendum)
Stable. Continue lithium and prozac. Followed by psych.

## 2014-09-14 NOTE — Patient Instructions (Addendum)
Pass by lab to pick up stool kit. Call to schedule mammogram. [facility] [phone] Pneumovax today. Advanced directive packet provided today.  Let me know in January (6 months after last dental work) about starting prolia vs boniva.  Framingham risk 7%. Recheck thyroid levels (lab visit only) in 1-2 months. Return in 1 year for next medicare wellness visit      Mediterranean Diet  Why follow it? Research shows. . Those who follow the Mediterranean diet have a reduced risk of heart disease  . The diet is associated with a reduced incidence of Parkinson's and Alzheimer's diseases . People following the diet may have longer life expectancies and lower rates of chronic diseases  . The Dietary Guidelines for Americans recommends the Mediterranean diet as an eating plan to promote health and prevent disease  What Is the Mediterranean Diet?  . Healthy eating plan based on typical foods and recipes of Mediterranean-style cooking . The diet is primarily a plant based diet; these foods should make up a majority of meals   Starches - Plant based foods should make up a majority of meals - They are an important sources of vitamins, minerals, energy, antioxidants, and fiber - Choose whole grains, foods high in fiber and minimally processed items  - Typical grain sources include wheat, oats, barley, corn, brown rice, bulgar, farro, millet, polenta, couscous  - Various types of beans include chickpeas, lentils, fava beans, black beans, white beans   Fruits  Veggies - Large quantities of antioxidant rich fruits & veggies; 6 or more servings  - Vegetables can be eaten raw or lightly drizzled with oil and cooked  - Vegetables common to the traditional Mediterranean Diet include: artichokes, arugula, beets, broccoli, brussel sprouts, cabbage, carrots, celery, collard greens, cucumbers, eggplant, kale, leeks, lemons, lettuce, mushrooms, okra, onions, peas, peppers, potatoes, pumpkin,  radishes, rutabaga, shallots, spinach, sweet potatoes, turnips, zucchini - Fruits common to the Mediterranean Diet include: apples, apricots, avocados, cherries, clementines, dates, figs, grapefruits, grapes, melons, nectarines, oranges, peaches, pears, pomegranates, strawberries, tangerines  Fats - Replace butter and margarine with healthy oils, such as olive oil, canola oil, and tahini  - Limit nuts to no more than a handful a day  - Nuts include walnuts, almonds, pecans, pistachios, pine nuts  - Limit or avoid candied, honey roasted or heavily salted nuts - Olives are central to the [employer] - can be eaten whole or used in a variety of dishes   Meats Protein - Limiting red meat: no more than a few times a month - When eating red meat: choose lean cuts and keep the portion to the size of deck of cards - Eggs: approx. 0 to 4 times a week  - Fish and lean poultry: at least 2 a week  - Healthy protein sources include, chicken, [place], lean beef, lamb - Increase intake of seafood such as tuna, salmon, trout, mackerel, shrimp, scallops - Avoid or limit high fat processed meats such as sausage and bacon  Dairy - Include moderate amounts of low fat dairy products  - Focus on healthy dairy such as fat free yogurt, skim milk, low or reduced fat cheese - Limit dairy products higher in fat such as whole or 2% milk, cheese, ice cream  Alcohol - Moderate amounts of red wine is ok  - No more than 5 oz daily for women (all ages) and men older than age 46  - No more than 10 oz of wine daily for men  younger than 16  Other - Limit sweets and other desserts  - Use herbs and spices instead of salt to flavor foods  - Herbs and spices common to the traditional Mediterranean Diet include: basil, bay leaves, chives, cloves, cumin, fennel, garlic, lavender, marjoram, mint, oregano, parsley, pepper, rosemary, sage, savory, sumac, tarragon, thyme   It's not just a diet, it's a lifestyle:  . The  Mediterranean diet includes lifestyle factors typical of those in the region  . Foods, drinks and meals are best eaten with others and savored . Daily physical activity is important for overall good health . This could be strenuous exercise like running and aerobics . This could also be more leisurely activities such as walking, housework, yard-work, or taking the stairs . Moderation is the key; a balanced and healthy diet accommodates most foods and drinks . Consider portion sizes and frequency of consumption of certain foods   Meal Ideas & Options:  . Breakfast:  o Whole wheat toast or whole wheat English muffins with peanut butter & hard boiled egg o Steel cut oats topped with apples & cinnamon and skim milk  o Fresh fruit: banana, strawberries, melon, berries, peaches  o Smoothies: strawberries, bananas, greek yogurt, peanut butter o Low fat greek yogurt with blueberries and granola  o Egg white omelet with spinach and mushrooms o Breakfast couscous: whole wheat couscous, apricots, skim milk, cranberries  . Sandwiches:  o Hummus and grilled vegetables (peppers, zucchini, squash) on whole wheat bread   o Grilled chicken on whole wheat pita with lettuce, tomatoes, cucumbers or tzatziki  o Tuna salad on whole wheat bread: tuna salad made with greek yogurt, olives, red peppers, capers, green onions o Garlic rosemary lamb pita: lamb sauted with garlic, rosemary, salt & pepper; add lettuce, cucumber, greek yogurt to pita - flavor with lemon juice and black pepper  . Seafood:  o Mediterranean grilled salmon, seasoned with garlic, basil, parsley, lemon juice and black pepper o Shrimp, lemon, and spinach whole-grain pasta salad made with low fat greek yogurt  o Seared scallops with lemon orzo  o Seared tuna steaks seasoned salt, pepper, coriander topped with tomato mixture of olives, tomatoes, olive oil, minced garlic, parsley, green onions and cappers  . Meats:  o Herbed greek chicken salad  with kalamata olives, cucumber, feta  o Red bell peppers stuffed with spinach, bulgur, lean ground beef (or lentils) & topped with feta   o Kebabs: skewers of chicken, tomatoes, onions, zucchini, squash  o [place] burgers: made with red onions, mint, dill, lemon juice, feta cheese topped with roasted red peppers . Vegetarian o Cucumber salad: cucumbers, artichoke hearts, celery, red onion, feta cheese, tossed in olive oil & lemon juice  o Hummus and whole grain pita points with a greek salad (lettuce, tomato, feta, olives, cucumbers, red onion) o Lentil soup with celery, carrots made with vegetable broth, garlic, salt and pepper  o Tabouli salad: parsley, bulgur, mint, scallions, cucumbers, tomato, radishes, lemon juice, olive oil, salt and pepper.

## 2014-09-14 NOTE — Progress Notes (Signed)
BP 130/80 mmHg  Pulse 80  Temp(Src) 98.2 F (36.8 C) (Oral)  Ht _0  (1.651 m)  Wt 149 lb 12 oz (67.926 kg)  BMI 24.92 kg/m2   CC: medicare wellness visit  Subjective:    Patient ID: Rachel Macias, female    DOB: Aug 29, 1945, 69 y.o.   MRN: [id number]  HPI: Claudene Gatliff is a 69 y.o. female presenting on 09/14/2014 for Annual Exam   Bipolar 1 disorder on lithium and prozac. Failed transition from lithium to lamictal. Desires to continue lithium. Denies cold intolerance, weight gain, constipation, skin or hair changes.  Hearing screen passed. Vision screen failed L eye but otherwise passed. Known cataracts.  No falls in last year Denies depression, anhedonia.  Preventative:  Colon cancer screening - no fmhx colon cancer. Discussed this. Hesitant for colonoscopy. interested in stool kit. 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 Decided to stop.  mammo 09/2013. Will call to schedule mammogram.  DEXA Date: 09/2013 T -2.8 femur. Prior on boniva IV x ~1 yr. Would consider prolia or IV bsiphosphonate. Continues dental work. Will defer bisphosphonate treatment for now. Trying to increase calcium in diet.  Flu shot this year done prevnar - 08/2013. Pneumovax today Tetanus - declines zostavax - declines G1P1 (son died at age 52yo), LMP 26 (age 87) Advanced directives - Has at home. HCPOA is sister. Asked to bring me copy.  Seat belt use discussed  Sunscreen use discussed, no changing moles on skin.  Husband passed away of brain cancer May 07, 2008 - she was sole caregiver for 18 months. Lives alone with 2 dogs, widow  39 yrs ago son died at age 85yo (hit by car). Stays involved with Women's group at her complex  Occupation: retired, used to work for [employer] ([place])  Edu: 13 yrs  Activity: no regular exercise  Diet: good water, fruits/vegetables some   Relevant past medical, surgical, family and social history reviewed and updated as indicated. Interim medical history since  our last visit reviewed. Allergies and medications reviewed and updated. Current Outpatient Prescriptions on File Prior to Visit  Medication Sig  . cholecalciferol (VITAMIN D) 1000 UNITS tablet Take 2 tablets (2,000 Units total) by mouth daily.  Marland Kitchen FLUoxetine (PROZAC) 20 MG capsule Take 20 mg by mouth daily.  Marland Kitchen lithium 300 MG tablet Take 900 mg by mouth daily.   No current facility-administered medications on file prior to visit.    Review of Systems Per HPI unless specifically indicated above     Objective:    BP 130/80 mmHg  Pulse 80  Temp(Src) 98.2 F (36.8 C) (Oral)  Ht _1  (1.651 m)  Wt 149 lb 12 oz (67.926 kg)  BMI 24.92 kg/m2  Wt Readings from Last 3 Encounters:  09/14/14 149 lb 12 oz (67.926 kg)  09/22/13 151 lb 8 oz (68.72 kg)  08/22/13 149 lb 8 oz (67.813 kg)    Physical Exam  Constitutional: She is oriented to person, place, and time. She appears well-developed and well-nourished. No distress.  HENT:  Head: Normocephalic and atraumatic.  Right Ear: Hearing, tympanic membrane, external ear and ear canal normal.  Left Ear: Hearing, tympanic membrane, external ear and ear canal normal.  Nose: Nose normal.  Mouth/Throat: Uvula is midline, oropharynx is clear and moist and mucous membranes are normal. No oropharyngeal exudate, posterior oropharyngeal edema or posterior oropharyngeal erythema.  Eyes: Conjunctivae and EOM are normal. Pupils are equal, round, and reactive to light. No scleral  icterus.  Neck: Normal range of motion. Neck supple. Carotid bruit is not present. No thyromegaly present.  Cardiovascular: Normal rate, regular rhythm, normal heart sounds and intact distal pulses.   No murmur heard. Pulses:      Radial pulses are 2+ on the right side, and 2+ on the left side.  Pulmonary/Chest: Effort normal and breath sounds normal. No respiratory distress. She has no wheezes. She has no rales.  breast - deferred  Abdominal: Soft. Bowel sounds are normal. She  exhibits no distension and no mass. There is no tenderness. There is no rebound and no guarding.  Genitourinary:  GYN - declined  Musculoskeletal: Normal range of motion. She exhibits no edema.  Lymphadenopathy:    She has no cervical adenopathy.  Neurological: She is alert and oriented to person, place, and time.  CN grossly intact, station and gait intact Recall 3/3  Calculation 5/5 serial 3s  Skin: Skin is warm and dry. No rash noted.  Psychiatric: She has a normal mood and affect. Her behavior is normal. Judgment and thought content normal.  Nursing note and vitals reviewed.  Results for orders placed or performed in visit on 08/27/14  Vit D  25 hydroxy (rtn osteoporosis monitoring)  Result Value Ref Range   VITD 31.20 30.00 - 100.00 ng/mL  Lipid panel  Result Value Ref Range   Cholesterol 257 (H) 0 - 200 mg/dL   Triglycerides 224.0 (H) 0.0 - 149.0 mg/dL   HDL 39.70 >39.00 mg/dL   VLDL 44.8 (H) 0.0 - 40.0 mg/dL   Total CHOL/HDL Ratio 6    NonHDL 629.47   Basic metabolic panel  Result Value Ref Range   Sodium 134 (L) 135 - 145 mEq/L   Potassium 4.0 3.5 - 5.1 mEq/L   Chloride 102 96 - 112 mEq/L   CO2 27 19 - 32 mEq/L   Glucose, Bld 112 (H) 70 - 99 mg/dL   BUN 13 6 - 23 mg/dL   Creatinine, Ser 0.90 0.40 - 1.20 mg/dL   Calcium 10.1 8.4 - 10.5 mg/dL   GFR 65.98 >60.00 mL/min  TSH  Result Value Ref Range   TSH 5.32 (H) 0.35 - 4.50 uIU/mL  Lithium level  Result Value Ref Range   Lithium Lvl 1.20 0.80 - 1.40 mEq/L  Parathyroid hormone, intact (no Ca)  Result Value Ref Range   PTH 66 (H) 14 - 64 pg/mL  LDL cholesterol, direct  Result Value Ref Range   Direct LDL 174.0 mg/dL      Assessment & Plan:   Problem List Items Addressed This Visit    Advanced care planning/counseling discussion    Advanced directives - does not have at home. Would want sister to be HCPOA. Packet provided today.      Bipolar disorder, in full remission, most recent episode manic    Stable.  Continue lithium and prozac. Followed by psych.      Dyslipidemia    Reviewed labs with patient as well as framingham risk score of 7%. Pt hesitant for pharmacotherapy in general. Will recommend mediterranean diet. Recheck in 1 year.      Medicare annual wellness visit, subsequent - Primary    I have personally reviewed the Medicare Annual Wellness questionnaire and have noted 1. The patient's medical and social history 2. Their use of alcohol, tobacco or illicit drugs 3. Their current medications and supplements 4. The patient's functional ability including ADL's, fall risks, home safety risks and hearing or visual impairment. Cognitive function  has been assessed and addressed as indicated.  5. Diet and physical activity 6. Evidence for depression or mood disorders The patients weight, height, BMI have been recorded in the chart. I have made referrals, counseling and provided education to the patient based on review of the above and I have provided the pt with a written personalized care plan for preventive services. Provider list updated.. See scanned questionairre as needed for further documentation. Reviewed preventative protocols and updated unless pt declined.       Osteoporosis    Discussed treatment options. Pt somewhat hesitant for pharmacotherapy (endorses reaching daily calcium and vit D goals) may be interested in IV boniva vs prolia. However still in midst of dental work. Will notify me when 6 mo from dental work to restart osteoporosis treatment.      Thyroid function test abnormal    Mildly low thyroid function noted today. ?lithium related. Will recheck in 1-2 months.  Denies hypothyroid symptoms.      Vitamin D deficiency    Slowly improving with treatment.       Other Visit Diagnoses    Special screening for malignant neoplasms, colon        Relevant Orders    Fecal occult blood, imunochemical        Follow up plan: Return in about 1 year (around  09/14/2015), or as needed, for medicare wellness.

## 2014-09-14 NOTE — Assessment & Plan Note (Signed)
Discussed treatment options. Pt somewhat hesitant for pharmacotherapy (endorses reaching daily calcium and vit D goals) may be interested in IV boniva vs prolia. However still in midst of dental work. Will notify me when 6 mo from dental work to restart osteoporosis treatment.

## 2014-09-14 NOTE — Assessment & Plan Note (Addendum)
Mildly low thyroid function noted today. ?lithium related. Will recheck in 1-2 months.  Denies hypothyroid symptoms.

## 2014-09-14 NOTE — Assessment & Plan Note (Signed)
Reviewed labs with patient as well as framingham risk score of 7%. Pt hesitant for pharmacotherapy in general. Will recommend mediterranean diet. Recheck in 1 year.

## 2014-09-19 ENCOUNTER — Other Ambulatory Visit: Payer: Self-pay | Admitting: Family Medicine

## 2014-09-19 DIAGNOSIS — Z1231 Encounter for screening mammogram for malignant neoplasm of breast: Secondary | ICD-10-CM

## 2014-09-20 DIAGNOSIS — F3174 Bipolar disorder, in full remission, most recent episode manic: Secondary | ICD-10-CM | POA: Diagnosis not present

## 2014-09-25 ENCOUNTER — Other Ambulatory Visit: Payer: Self-pay | Admitting: Family Medicine

## 2014-09-25 ENCOUNTER — Ambulatory Visit
Admission: RE | Admit: 2014-09-25 | Discharge: 2014-09-25 | Disposition: A | Discharge status: Home / Self Care | Payer: Medicare Other | Source: Ambulatory Visit | Attending: Family Medicine | Admitting: Family Medicine

## 2014-09-25 DIAGNOSIS — Z1231 Encounter for screening mammogram for malignant neoplasm of breast: Secondary | ICD-10-CM

## 2014-09-27 LAB — HM MAMMOGRAPHY: HM Mammogram: NORMAL

## 2014-09-28 ENCOUNTER — Encounter: Payer: Self-pay | Admitting: Family Medicine

## 2014-10-05 ENCOUNTER — Other Ambulatory Visit: Payer: Medicare Other

## 2014-10-05 DIAGNOSIS — Z1211 Encounter for screening for malignant neoplasm of colon: Secondary | ICD-10-CM

## 2014-10-05 LAB — FECAL OCCULT BLOOD, IMMUNOCHEMICAL: Fecal Occult Bld: NEGATIVE

## 2014-10-06 LAB — FECAL OCCULT BLOOD, GUAIAC: Fecal Occult Blood: NEGATIVE

## 2014-10-08 ENCOUNTER — Encounter: Payer: Self-pay | Admitting: *Deleted

## 2014-10-12 ENCOUNTER — Encounter: Payer: Self-pay | Admitting: Nurse Practitioner

## 2014-10-12 ENCOUNTER — Ambulatory Visit (INDEPENDENT_AMBULATORY_CARE_PROVIDER_SITE_OTHER): Payer: Medicare Other | Admitting: Nurse Practitioner

## 2014-10-12 VITALS — BP 126/82 | HR 77 | Temp 98.7°F | Resp 14 | Ht 65.0 in | Wt 152.0 lb

## 2014-10-12 DIAGNOSIS — F3174 Bipolar disorder, in full remission, most recent episode manic: Secondary | ICD-10-CM

## 2014-10-12 DIAGNOSIS — R946 Abnormal results of thyroid function studies: Secondary | ICD-10-CM | POA: Diagnosis not present

## 2014-10-12 LAB — TSH: TSH: 2.93 u[IU]/mL (ref 0.35–4.50)

## 2014-10-12 LAB — T4, FREE: FREE T4: 0.8 ng/dL (ref 0.60–1.60)

## 2014-10-12 NOTE — Progress Notes (Signed)
Patient ID: Rachel Macias, female    DOB: May 02, 1945  Age: 69 y.o. MRN: [id number]  CC: Establish Care   HPI Ramia Sidney presents for moving from [facility] to our facility.   1) Seeing Dr. Maryruth Bun for Bipolar 1 disorder, they will be working on moving her from Lithium to probably Depakote due to onset of tremors recently. Stress typically brings on manic episodes with hallucinations. She reports she has been in remission for years.   2) Thyroid- denies hypothyroid symptoms, pt has had abnormal tests in the recent past. Will recheck today.   History Jazyah has a past medical history of Bipolar 1 disorder (1995); Ex-smoker (2001); Vitamin D deficiency; Osteoporosis (2015); and Postmenopausal.   She has past surgical history that includes Cesarean section; Tubal ligation; and DEXA (09/2013).   Her family history includes Diabetes in her brother; Hypertension in her brother; Thyroid disease in her other. There is no history of Stroke, CAD, or Cancer.She reports that she quit smoking about 15 years ago. She has never used smokeless tobacco. She reports that she does not drink alcohol or use illicit drugs.  Outpatient Prescriptions Prior to Visit  Medication Sig Dispense Refill  . cholecalciferol (VITAMIN D) 1000 UNITS tablet Take 2 tablets (2,000 Units total) by mouth daily.    Marland Kitchen FLUoxetine (PROZAC) 20 MG capsule Take 20 mg by mouth daily.    Marland Kitchen lithium 300 MG tablet Take 900 mg by mouth daily.     No facility-administered medications prior to visit.    ROS Review of Systems  Constitutional: Negative for fever, chills, diaphoresis and fatigue.  Respiratory: Negative for chest tightness, shortness of breath and wheezing.   Cardiovascular: Negative for chest pain, palpitations and leg swelling.  Gastrointestinal: Negative for nausea, vomiting and diarrhea.  Skin: Negative for rash.  Neurological: Negative for dizziness, weakness, numbness and headaches.  Psychiatric/Behavioral: Negative for suicidal  ideas and sleep disturbance. The patient is not nervous/anxious and is not hyperactive.     Objective:  BP 126/82 mmHg  Pulse 77  Temp(Src) 98.7 F (37.1 C)  Resp 14  Ht  (1.651 m)  Wt 152 lb (68.947 kg)  BMI 25.29 kg/m2  SpO2 98%  Physical Exam  Constitutional: She is oriented to person, place, and time. She appears well-developed and well-nourished. No distress.  HENT:  Head: Normocephalic and atraumatic.  Right Ear: External ear normal.  Left Ear: External ear normal.  Cardiovascular: Normal rate, regular rhythm and normal heart sounds.  Exam reveals no gallop and no friction rub.   No murmur heard. Pulmonary/Chest: Effort normal and breath sounds normal. No respiratory distress. She has no wheezes. She has no rales. She exhibits no tenderness.  Neurological: She is alert and oriented to person, place, and time. No cranial nerve deficit. She exhibits normal muscle tone. Coordination normal.  Skin: Skin is warm and dry. No rash noted. She is not diaphoretic.  Psychiatric: She has a normal mood and affect. Her behavior is normal. Judgment and thought content normal.   Assessment & Plan:   Royalti was seen today for establish care.  Diagnoses and all orders for this visit:  Thyroid function test abnormal -     TSH -     T4, free   I am having Ms. Koslow maintain her lithium, FLUoxetine, and cholecalciferol.  No orders of the defined types were placed in this encounter.     Follow-up: Return in about 6 months (around 04/14/2015).

## 2014-10-12 NOTE — Patient Instructions (Addendum)
Welcome to [facility]! Nice to meet you and we will follow up with you after your results.

## 2014-10-18 NOTE — Assessment & Plan Note (Signed)
Stable. Obtained TSH and T4 Free below are results. Follow up as needed or 6 months.   Lab Results  Component Value Date   TSH 2.93 10/12/2014  Normal T4 free

## 2014-10-18 NOTE — Assessment & Plan Note (Signed)
Stable currently. Seeing Dr. Maryruth Bun. Might switch from lithium to another medication shortly due to new onset tremors with lithium.

## 2014-11-15 ENCOUNTER — Other Ambulatory Visit: Payer: Medicare Other

## 2014-12-11 DIAGNOSIS — F3174 Bipolar disorder, in full remission, most recent episode manic: Secondary | ICD-10-CM | POA: Diagnosis not present

## 2015-03-14 DIAGNOSIS — F3174 Bipolar disorder, in full remission, most recent episode manic: Secondary | ICD-10-CM | POA: Diagnosis not present

## 2015-06-04 DIAGNOSIS — F3174 Bipolar disorder, in full remission, most recent episode manic: Secondary | ICD-10-CM | POA: Diagnosis not present

## 2015-06-11 DIAGNOSIS — F3174 Bipolar disorder, in full remission, most recent episode manic: Secondary | ICD-10-CM | POA: Diagnosis not present

## 2015-09-16 ENCOUNTER — Encounter: Payer: Medicare Other | Admitting: Family Medicine

## 2015-09-18 DIAGNOSIS — F3174 Bipolar disorder, in full remission, most recent episode manic: Secondary | ICD-10-CM | POA: Diagnosis not present

## 2015-12-12 ENCOUNTER — Encounter: Payer: Self-pay | Admitting: Family

## 2015-12-12 ENCOUNTER — Ambulatory Visit (INDEPENDENT_AMBULATORY_CARE_PROVIDER_SITE_OTHER): Payer: Medicare Other | Admitting: Family

## 2015-12-12 VITALS — BP 140/90 | HR 75 | Temp 98.2°F | Ht 65.0 in | Wt 155.8 lb

## 2015-12-12 DIAGNOSIS — Z0000 Encounter for general adult medical examination without abnormal findings: Secondary | ICD-10-CM | POA: Insufficient documentation

## 2015-12-12 DIAGNOSIS — F3174 Bipolar disorder, in full remission, most recent episode manic: Secondary | ICD-10-CM | POA: Diagnosis not present

## 2015-12-12 DIAGNOSIS — M810 Age-related osteoporosis without current pathological fracture: Secondary | ICD-10-CM

## 2015-12-12 DIAGNOSIS — Z23 Encounter for immunization: Secondary | ICD-10-CM

## 2015-12-12 NOTE — Progress Notes (Signed)
Pre visit review using our clinic review tool, if applicable. No additional management support is needed unless otherwise documented below in the visit note. 

## 2015-12-12 NOTE — Patient Instructions (Addendum)
Pleasure meeting you.    For post menopausal women, guidelines recommend a diet with 1200 mg of Calcium per day. If you are eating calcium rich foods, you do not need a calcium supplement. The body better absorbs the calcium that you eat over supplementation. If you do supplement, I recommend not supplementing the full 1200 mg/ day as this can lead to increased risk of cardiovascular disease. I recommend Calcium Citrate over the counter, and you may take a total of 600 to 800 mg per day in divided doses with meals for best absorption.   For bone health, you need adequate vitamin D, and I recommend you supplement as it is harder to do so with diet alone. I recommend cholecalciferol 800 units daily.  Also, please ensure you are following a diet high in calcium -- research shows better outcomes with dietary sources including kale, yogurt, broccolii, cheese, okra, almonds- to name a few.     Also remember that exercise is a great medicine for maintain and preserve bone health. Advise moderate exercise for 30 minutes , 3 times per week.    Health Maintenance, Female Adopting a healthy lifestyle and getting preventive care can go a long way to promote health and wellness. Talk with your health care provider about what schedule of regular examinations is right for you. This is a good chance for you to check in with your provider about disease prevention and staying healthy. In between checkups, there are plenty of things you can do on your own. Experts have done a lot of research about which lifestyle changes and preventive measures are most likely to keep you healthy. Ask your health care provider for more information. WEIGHT AND DIET  Eat a healthy diet  Be sure to include plenty of vegetables, fruits, low-fat dairy products, and lean protein.  Do not eat a lot of foods high in solid fats, added sugars, or salt.  Get regular exercise. This is one of the most important things you can do for your  health.  Most adults should exercise for at least 150 minutes each week. The exercise should increase your heart rate and make you sweat (moderate-intensity exercise).  Most adults should also do strengthening exercises at least twice a week. This is in addition to the moderate-intensity exercise.  Maintain a healthy weight  Body mass index (BMI) is a measurement that can be used to identify possible weight problems. It estimates body fat based on height and weight. Your health care provider can help determine your BMI and help you achieve or maintain a healthy weight.  For females 15 years of age and older:   A BMI below 18.5 is considered underweight.  A BMI of 18.5 to 24.9 is normal.  A BMI of 25 to 29.9 is considered overweight.  A BMI of 30 and above is considered obese.  Watch levels of cholesterol and blood lipids  You should start having your blood tested for lipids and cholesterol at 70 years of age, then have this test every 5 years.  You may need to have your cholesterol levels checked more often if:  Your lipid or cholesterol levels are high.  You are older than 70 years of age.  You are at high risk for heart disease.  CANCER SCREENING   Lung Cancer  Lung cancer screening is recommended for adults 72-6 years old who are at high risk for lung cancer because of a history of smoking.  A yearly low-dose CT scan of  the lungs is recommended for people who:  Currently smoke.  Have quit within the past 15 years.  Have at least a 30-pack-year history of smoking. A pack year is smoking an average of one pack of cigarettes a day for 1 year.  Yearly screening should continue until it has been 15 years since you quit.  Yearly screening should stop if you develop a health problem that would prevent you from having lung cancer treatment.  Breast Cancer  Practice breast self-awareness. This means understanding how your breasts normally appear and feel.  It also  means doing regular breast self-exams. Let your health care provider know about any changes, no matter how small.  If you are in your 20s or 30s, you should have a clinical breast exam (CBE) by a health care provider every 1-3 years as part of a regular health exam.  If you are 20 or older, have a CBE every year. Also consider having a breast X-ray (mammogram) every year.  If you have a family history of breast cancer, talk to your health care provider about genetic screening.  If you are at high risk for breast cancer, talk to your health care provider about having an MRI and a mammogram every year.  Breast cancer gene (BRCA) assessment is recommended for women who have family members with BRCA-related cancers. BRCA-related cancers include:  Breast.  Ovarian.  Tubal.  Peritoneal cancers.  Results of the assessment will determine the need for genetic counseling and BRCA1 and BRCA2 testing. Cervical Cancer Your health care provider may recommend that you be screened regularly for cancer of the pelvic organs (ovaries, uterus, and vagina). This screening involves a pelvic examination, including checking for microscopic changes to the surface of your cervix (Pap test). You may be encouraged to have this screening done every 3 years, beginning at age 61.  For women ages 52-65, health care providers may recommend pelvic exams and Pap testing every 3 years, or they may recommend the Pap and pelvic exam, combined with testing for human papilloma virus (HPV), every 5 years. Some types of HPV increase your risk of cervical cancer. Testing for HPV may also be done on women of any age with unclear Pap test results.  Other health care providers may not recommend any screening for nonpregnant women who are considered low risk for pelvic cancer and who do not have symptoms. Ask your health care provider if a screening pelvic exam is right for you.  If you have had past treatment for cervical cancer or a  condition that could lead to cancer, you need Pap tests and screening for cancer for at least 20 years after your treatment. If Pap tests have been discontinued, your risk factors (such as having a new sexual partner) need to be reassessed to determine if screening should resume. Some women have medical problems that increase the chance of getting cervical cancer. In these cases, your health care provider may recommend more frequent screening and Pap tests. Colorectal Cancer  This type of cancer can be detected and often prevented.  Routine colorectal cancer screening usually begins at 70 years of age and continues through 70 years of age.  Your health care provider may recommend screening at an earlier age if you have risk factors for colon cancer.  Your health care provider may also recommend using home test kits to check for hidden blood in the stool.  A small camera at the end of a tube can be used to examine  your colon directly (sigmoidoscopy or colonoscopy). This is done to check for the earliest forms of colorectal cancer.  Routine screening usually begins at age 45.  Direct examination of the colon should be repeated every 5-10 years through 70 years of age. However, you may need to be screened more often if early forms of precancerous polyps or small growths are found. Skin Cancer  Check your skin from head to toe regularly.  Tell your health care provider about any new moles or changes in moles, especially if there is a change in a mole's shape or color.  Also tell your health care provider if you have a mole that is larger than the size of a pencil eraser.  Always use sunscreen. Apply sunscreen liberally and repeatedly throughout the day.  Protect yourself by wearing long sleeves, pants, a wide-brimmed hat, and sunglasses whenever you are outside. HEART DISEASE, DIABETES, AND HIGH BLOOD PRESSURE   High blood pressure causes heart disease and increases the risk of stroke. High  blood pressure is more likely to develop in:  People who have blood pressure in the high end of the normal range (130-139/85-89 mm Hg).  People who are overweight or obese.  People who are African American.  If you are 71-82 years of age, have your blood pressure checked every 3-5 years. If you are 60 years of age or older, have your blood pressure checked every year. You should have your blood pressure measured twice--once when you are at a hospital or clinic, and once when you are not at a hospital or clinic. Record the average of the two measurements. To check your blood pressure when you are not at a hospital or clinic, you can use:  An automated blood pressure machine at a pharmacy.  A home blood pressure monitor.  If you are between 49 years and 38 years old, ask your health care provider if you should take aspirin to prevent strokes.  Have regular diabetes screenings. This involves taking a blood sample to check your fasting blood sugar level.  If you are at a normal weight and have a low risk for diabetes, have this test once every three years after 70 years of age.  If you are overweight and have a high risk for diabetes, consider being tested at a younger age or more often. PREVENTING INFECTION  Hepatitis B  If you have a higher risk for hepatitis B, you should be screened for this virus. You are considered at high risk for hepatitis B if:  You were born in a country where hepatitis B is common. Ask your health care provider which countries are considered high risk.  Your parents were born in a high-risk country, and you have not been immunized against hepatitis B (hepatitis B vaccine).  You have HIV or AIDS.  You use needles to inject street drugs.  You live with someone who has hepatitis B.  You have had sex with someone who has hepatitis B.  You get hemodialysis treatment.  You take certain medicines for conditions, including cancer, organ transplantation, and  autoimmune conditions. Hepatitis C  Blood testing is recommended for:  Everyone born from [birth year] through 1965.  Anyone with known risk factors for hepatitis C. Sexually transmitted infections (STIs)  You should be screened for sexually transmitted infections (STIs) including gonorrhea and chlamydia if:  You are sexually active and are younger than 70 years of age.  You are older than 70 years of age and your health care  provider tells you that you are at risk for this type of infection.  Your sexual activity has changed since you were last screened and you are at an increased risk for chlamydia or gonorrhea. Ask your health care provider if you are at risk.  If you do not have HIV, but are at risk, it may be recommended that you take a prescription medicine daily to prevent HIV infection. This is called pre-exposure prophylaxis (PrEP). You are considered at risk if:  You are sexually active and do not regularly use condoms or know the HIV status of your partner(s).  You take drugs by injection.  You are sexually active with a partner who has HIV. Talk with your health care provider about whether you are at high risk of being infected with HIV. If you choose to begin PrEP, you should first be tested for HIV. You should then be tested every 3 months for as long as you are taking PrEP.  PREGNANCY   If you are premenopausal and you may become pregnant, ask your health care provider about preconception counseling.  If you may become pregnant, take 400 to 800 micrograms (mcg) of folic acid every day.  If you want to prevent pregnancy, talk to your health care provider about birth control (contraception). OSTEOPOROSIS AND MENOPAUSE   Osteoporosis is a disease in which the bones lose minerals and strength with aging. This can result in serious bone fractures. Your risk for osteoporosis can be identified using a bone density scan.  If you are 77 years of age or older, or if you are at risk  for osteoporosis and fractures, ask your health care provider if you should be screened.  Ask your health care provider whether you should take a calcium or vitamin D supplement to lower your risk for osteoporosis.  Menopause may have certain physical symptoms and risks.  Hormone replacement therapy may reduce some of these symptoms and risks. Talk to your health care provider about whether hormone replacement therapy is right for you.  HOME CARE INSTRUCTIONS   Schedule regular health, dental, and eye exams.  Stay current with your immunizations.   Do not use any tobacco products including cigarettes, chewing tobacco, or electronic cigarettes.  If you are pregnant, do not drink alcohol.  If you are breastfeeding, limit how much and how often you drink alcohol.  Limit alcohol intake to no more than 1 drink per day for nonpregnant women. One drink equals 12 ounces of beer, 5 ounces of wine, or 1 ounces of hard liquor.  Do not use street drugs.  Do not share needles.  Ask your health care provider for help if you need support or information about quitting drugs.  Tell your health care provider if you often feel depressed.  Tell your health care provider if you have ever been abused or do not feel safe at home.   This information is not intended to replace advice given to you by your health care provider. Make sure you discuss any questions you have with your health care provider.   Document Released: 09/08/2010 Document Revised: 03/16/2014 Document Reviewed: 01/25/2013 Elsevier Interactive Patient Education [employer].

## 2015-12-12 NOTE — Assessment & Plan Note (Signed)
Repeat DEXA. Will will discuss options for treating osteoporosis based on results. Patient has a preference for doing IV infusions as she is worried about side effect of jaw necrosis that she had heard about.  Education given regarding appropriate supplementation with calcium, vitamin D.

## 2015-12-12 NOTE — Assessment & Plan Note (Signed)
Patient is due for mammogram. She's also due for  her Cologuard screen and new order placed. Patient declines pelvic exam based on age and preference. She no longer does Pap smears. She is also due for DEXA as her last one showed osteoporosis. Patient has a 30 year pack year history of smoking; she is a former smoker. She would like to do low-dose CT scan for lung cancer screening. Patient is up-to-date on her pneumococcal and Prevnar vaccine. She politely declines a tetanus, zoster vaccine today. Screening labs including hepatitis C. Encouraged regular exercise.

## 2015-12-12 NOTE — Progress Notes (Signed)
Subjective:    Patient ID: Rachel Macias, female    DOB: 04-Jan-1946, 70 y.o.   MRN: [id number]  CC: [name] is a 70 y.o. female who presents today for physical exam.    HPI: Patient here for CPE and to establish care. Feeling well today, no complaints.   Bipolar- In remission. Started treatment 1994.  Lithium and Prozac; follows with Dr. Judie Grieve. Sleeping well. No current manic episodes. No thoughts of hurting herself or anyone else.        Colorectal Cancer Screening: Cologuard, negative 2016.  Due. No family h/o colon cancer.  Breast Cancer Screening: Mammogram due; she will schedule herself.  Cervical Cancer Screening: Declines based on age and preference. No h/o of abnormal or GYN cancer.  Bone Health screening/DEXA for 65+: Last DEXA 2015 t score -2.8. Has been on fosamax and boniva. Had IV boniva for some time and stopped when husband got sick. No problems swallowing or esophagitis.   Lung Cancer Screening: Doesn't have 30 year pack year history and age > 55 years. Ordered today.   Immunizations       Tetanus - Due; declines       Zoster- Due; declines        Pneumococcal -Done Hepatitis C screening - Candidate for Labs: Screening labs today. Exercise: Gets regular exercise.  Alcohol use: No Smoking/tobacco use: former smoker.  Regular dental exams: UTD  Wears seat belt: Yes.  HISTORY:  Past Medical History:  Diagnosis Date  . Bipolar 1 disorder (HCC) 1995   Dr. Caryn Section [phone])  . Ex-smoker 2001   minimal  . Osteoporosis 2015   fosamax reaction, then was on boniva IV for ~1 yr  . Postmenopausal    age 89  . Vitamin D deficiency     Past Surgical History:  Procedure Laterality Date  . CESAREAN SECTION    . DEXA  09/2013   T -2.8 femur  . TUBAL LIGATION     with reversal   Family History  Problem Relation Age of Onset  . Thyroid disease Other     runs in family  . Diabetes Brother   . Hypertension Brother   . Stroke Neg Hx   . CAD Neg  Hx   . Cancer Neg Hx       ALLERGIES: Amoxicillin and Fosamax [alendronate sodium]  Current Outpatient Prescriptions on File Prior to Visit  Medication Sig Dispense Refill  . cholecalciferol (VITAMIN D) 1000 UNITS tablet Take 2 tablets (2,000 Units total) by mouth daily.    Marland Kitchen FLUoxetine (PROZAC) 20 MG capsule Take 20 mg by mouth daily.     No current facility-administered medications on file prior to visit.     Social History  Substance Use Topics  . Smoking status: Former Smoker    Quit date: 03/10/1999  . Smokeless tobacco: Never Used  . Alcohol use No    Review of Systems  Constitutional: Negative for chills, fever and unexpected weight change.  HENT: Negative for congestion.   Respiratory: Negative for cough.   Cardiovascular: Negative for chest pain, palpitations and leg swelling.  Gastrointestinal: Negative for nausea and vomiting.  Musculoskeletal: Negative for arthralgias and myalgias.  Skin: Negative for rash.  Neurological: Negative for headaches.  Hematological: Negative for adenopathy.  Psychiatric/Behavioral: Negative for confusion.      Objective:    BP 140/90   Pulse 75   Temp 98.2 F (36.8 C) (Oral)   Ht 5\' 5"  (1.651 m)  Wt 155 lb 12 oz (70.6 kg)   SpO2 98%   BMI 25.92 kg/m   BP Readings from Last 3 Encounters:  12/12/15 140/90  10/12/14 126/82  09/14/14 130/80   Wt Readings from Last 3 Encounters:  12/12/15 155 lb 12 oz (70.6 kg)  10/12/14 152 lb (68.9 kg)  09/14/14 149 lb 12 oz (67.9 kg)    Physical Exam  Constitutional: She appears well-developed and well-nourished.  Eyes: Conjunctivae are normal.  Neck: No thyroid mass and no thyromegaly present.  Cardiovascular: Normal rate, regular rhythm, normal heart sounds and normal pulses.   Pulmonary/Chest: Effort normal and breath sounds normal. She has no wheezes. She has no rhonchi. She has no rales. Right breast exhibits no inverted nipple, no mass, no nipple discharge, no skin change and  no tenderness. Left breast exhibits no inverted nipple, no mass, no nipple discharge, no skin change and no tenderness. Breasts are symmetrical.  CBE performed.   Lymphadenopathy:       Head (right side): No submental, no submandibular, no tonsillar, no preauricular, no posterior auricular and no occipital adenopathy present.       Head (left side): No submental, no submandibular, no tonsillar, no preauricular, no posterior auricular and no occipital adenopathy present.    She has no cervical adenopathy.       Right cervical: No superficial cervical, no deep cervical and no posterior cervical adenopathy present.      Left cervical: No superficial cervical, no deep cervical and no posterior cervical adenopathy present.    She has no axillary adenopathy.  Neurological: She is alert.  Skin: Skin is warm and dry.  Psychiatric: She has a normal mood and affect. Her speech is normal and behavior is normal. Thought content normal.  Vitals reviewed.      Assessment & Plan:   Problem List Items Addressed This Visit      Musculoskeletal and Integument   Osteoporosis    Repeat DEXA. Will will discuss options for treating osteoporosis based on results. Patient has a preference for doing IV infusions as she is worried about side effect of jaw necrosis that she had heard about.  Education given regarding appropriate supplementation with calcium, vitamin D.        Other   Bipolar disorder, in full remission, most recent episode manic (HCC)    In remission. Follows with Dr. Maryruth BunKapur.       Routine physical examination - Primary    Patient is due for mammogram. She's also due for  her Cologuard screen and new order placed. Patient declines pelvic exam based on age and preference. She no longer does Pap smears. She is also due for DEXA as her last one showed osteoporosis. Patient has a 30 year pack year history of smoking; she is a former smoker. She would like to do low-dose CT scan for lung cancer  screening. Patient is up-to-date on her pneumococcal and Prevnar vaccine. She politely declines a tetanus, zoster vaccine today. Screening labs including hepatitis C. Encouraged regular exercise.      Relevant Orders   MM DIGITAL SCREENING BILATERAL   DG Bone Density   CT CHEST LUNG CANCER SCREENING LOW DOSE WO CONTRAST   CBC with Differential/Platelet   Comprehensive metabolic panel   Hemoglobin A1c   Hepatitis C antibody   Lipid panel   TSH   VITAMIN D 25 Hydroxy (Vit-D Deficiency, Fractures)    Other Visit Diagnoses    Encounter for immunization  Relevant Orders   Flu vaccine HIGH DOSE PF (Completed)       I have discontinued Ms. Benkert's lithium. I am also having her maintain her FLUoxetine, cholecalciferol, and lithium carbonate.   Meds ordered this encounter  Medications  . lithium carbonate 300 MG capsule    Return precautions given.   Risks, benefits, and alternatives of the medications and treatment plan prescribed today were discussed, and patient expressed understanding.   Education regarding symptom management and diagnosis given to patient on AVS.   Continue to follow with Rennie Plowman, FNP for routine health maintenance.   Stephannie Peters and I agreed with plan.   Rennie Plowman, FNP

## 2015-12-12 NOTE — Assessment & Plan Note (Signed)
In remission. Follows with Dr. Maryruth BunKapur.

## 2015-12-17 ENCOUNTER — Telehealth: Payer: Self-pay | Admitting: *Deleted

## 2015-12-17 ENCOUNTER — Encounter: Payer: Self-pay | Admitting: Family

## 2015-12-17 DIAGNOSIS — F3174 Bipolar disorder, in full remission, most recent episode manic: Secondary | ICD-10-CM | POA: Diagnosis not present

## 2015-12-17 DIAGNOSIS — Z79899 Other long term (current) drug therapy: Secondary | ICD-10-CM | POA: Diagnosis not present

## 2015-12-17 NOTE — Telephone Encounter (Signed)
Received referral for initial lung cancer screening scan. Contacted patient and obtained smoking history,(former, quit 14 years ago with 10 pack year history) as well as answering questions related to screening process. Patient denies signs of lung cancer such as weight loss or hemoptysis. Patient denies comorbidity that would prevent curative treatment if lung cancer were found. Discussed that patient does not meet screening criteria with minimal pack year history. Patient is aware that she should notify her PCP for any signs of lung cancer but that is not considered high risk to indicate need of lung screening scan.

## 2015-12-18 DIAGNOSIS — F3174 Bipolar disorder, in full remission, most recent episode manic: Secondary | ICD-10-CM | POA: Diagnosis not present

## 2015-12-24 ENCOUNTER — Other Ambulatory Visit (INDEPENDENT_AMBULATORY_CARE_PROVIDER_SITE_OTHER): Payer: Medicare Other

## 2015-12-24 DIAGNOSIS — Z1211 Encounter for screening for malignant neoplasm of colon: Secondary | ICD-10-CM | POA: Diagnosis not present

## 2015-12-24 DIAGNOSIS — Z1159 Encounter for screening for other viral diseases: Secondary | ICD-10-CM | POA: Diagnosis not present

## 2015-12-24 DIAGNOSIS — Z1212 Encounter for screening for malignant neoplasm of rectum: Secondary | ICD-10-CM | POA: Diagnosis not present

## 2015-12-24 DIAGNOSIS — Z0000 Encounter for general adult medical examination without abnormal findings: Secondary | ICD-10-CM | POA: Diagnosis not present

## 2015-12-24 LAB — CBC WITH DIFFERENTIAL/PLATELET
BASOS PCT: 0.3 % (ref 0.0–3.0)
Basophils Absolute: 0 10*3/uL (ref 0.0–0.1)
EOS ABS: 0.3 10*3/uL (ref 0.0–0.7)
EOS PCT: 2.6 % (ref 0.0–5.0)
HCT: 39.1 % (ref 36.0–46.0)
Hemoglobin: 13.3 g/dL (ref 12.0–15.0)
LYMPHS ABS: 1.9 10*3/uL (ref 0.7–4.0)
Lymphocytes Relative: 18.6 % (ref 12.0–46.0)
MCHC: 34 g/dL (ref 30.0–36.0)
MCV: 92.7 fl (ref 78.0–100.0)
MONO ABS: 0.7 10*3/uL (ref 0.1–1.0)
Monocytes Relative: 7.3 % (ref 3.0–12.0)
NEUTROS PCT: 71.2 % (ref 43.0–77.0)
Neutro Abs: 7.2 10*3/uL (ref 1.4–7.7)
Platelets: 422 10*3/uL — ABNORMAL HIGH (ref 150.0–400.0)
RBC: 4.22 Mil/uL (ref 3.87–5.11)
RDW: 13.5 % (ref 11.5–15.5)
WBC: 10.2 10*3/uL (ref 4.0–10.5)

## 2015-12-24 LAB — COMPREHENSIVE METABOLIC PANEL
ALT: 14 U/L (ref 0–35)
AST: 15 U/L (ref 0–37)
Albumin: 4.6 g/dL (ref 3.5–5.2)
Alkaline Phosphatase: 78 U/L (ref 39–117)
BUN: 16 mg/dL (ref 6–23)
CHLORIDE: 104 meq/L (ref 96–112)
CO2: 29 meq/L (ref 19–32)
Calcium: 10 mg/dL (ref 8.4–10.5)
Creatinine, Ser: 1.1 mg/dL (ref 0.40–1.20)
GFR: 52.14 mL/min — ABNORMAL LOW (ref >60.00)
GLUCOSE: 110 mg/dL — AB (ref 70–99)
POTASSIUM: 4.1 meq/L (ref 3.5–5.1)
SODIUM: 140 meq/L (ref 135–145)
Total Bilirubin: 0.5 mg/dL (ref 0.2–1.2)
Total Protein: 8.4 g/dL — ABNORMAL HIGH (ref 6.0–8.3)

## 2015-12-24 LAB — LDL CHOLESTEROL, DIRECT: Direct LDL: 207 mg/dL

## 2015-12-24 LAB — LIPID PANEL
CHOL/HDL RATIO: 7
Cholesterol: 289 mg/dL — ABNORMAL HIGH (ref 0–200)
HDL: 43.6 mg/dL (ref >39.00)
NONHDL: 245.8
Triglycerides: 236 mg/dL — ABNORMAL HIGH (ref 0.0–149.0)
VLDL: 47.2 mg/dL — AB (ref 0.0–40.0)

## 2015-12-24 LAB — HEMOGLOBIN A1C: Hgb A1c MFr Bld: 5.1 % (ref 4.6–6.5)

## 2015-12-24 LAB — TSH: TSH: 3.3 u[IU]/mL (ref 0.35–4.50)

## 2015-12-24 LAB — VITAMIN D 25 HYDROXY (VIT D DEFICIENCY, FRACTURES): VITD: 26.39 ng/mL — ABNORMAL LOW (ref 30.00–100.00)

## 2015-12-25 ENCOUNTER — Encounter: Payer: Self-pay | Admitting: Family

## 2015-12-25 LAB — HEPATITIS C ANTIBODY: HCV AB: NEGATIVE

## 2015-12-26 ENCOUNTER — Other Ambulatory Visit: Payer: Self-pay | Admitting: Family

## 2015-12-26 DIAGNOSIS — E785 Hyperlipidemia, unspecified: Secondary | ICD-10-CM

## 2015-12-26 MED ORDER — ATORVASTATIN CALCIUM 80 MG PO TABS
80.0000 mg | ORAL_TABLET | Freq: Every day | ORAL | 3 refills | Status: DC
Start: 2015-12-26 — End: 2016-01-05

## 2015-12-27 LAB — COLOGUARD: COLOGUARD: NEGATIVE

## 2015-12-31 ENCOUNTER — Telehealth: Payer: Self-pay | Admitting: Family

## 2015-12-31 NOTE — Telephone Encounter (Signed)
Please advise 

## 2015-12-31 NOTE — Telephone Encounter (Signed)
Pt called and stated that she is having pains in back and neck along with across abdomen. She thinks that it might be the atorvastatin (LIPITOR) 80 MG tablet. Would you consider a lower dose or change the medication. Please advise, thank you!  Call pt @ [phone]

## 2015-12-31 NOTE — Telephone Encounter (Signed)
Please stop med.  If pain stops, she may try taking 3x a week and see if she tolerates.   Please triage and ensure not cardiac.

## 2016-01-01 NOTE — Telephone Encounter (Signed)
Patient will not take any atorvastatin no matter what. She refused to try 3x a week.  No cardiac SX. Patient had muscle/joint aches, nausea, weak.  After patient stopped taking medication she is feeling better but still have SX.

## 2016-01-03 ENCOUNTER — Telehealth: Payer: Self-pay

## 2016-01-03 NOTE — Telephone Encounter (Signed)
Patient called asking if there was another medication she could try due to her cholesterol.  "Not attack the cholesterol so harshly".  Please advise.

## 2016-01-05 ENCOUNTER — Other Ambulatory Visit: Payer: Self-pay | Admitting: Family

## 2016-01-05 DIAGNOSIS — E785 Hyperlipidemia, unspecified: Secondary | ICD-10-CM

## 2016-01-05 MED ORDER — SIMVASTATIN 20 MG PO TABS: 20.0000 mg | ORAL_TABLET | Freq: Every evening | ORAL | 3 refills | Status: DC

## 2016-01-05 NOTE — Progress Notes (Signed)
Call patient and let her know we can try zocor which has a lower potency and usually less side effects.   She can even try just taking 2-3x per week to start and work up to daily if she can tolerate.

## 2016-01-06 NOTE — Progress Notes (Signed)
Patient has been notified

## 2016-01-21 ENCOUNTER — Ambulatory Visit
Admission: RE | Admit: 2016-01-21 | Discharge: 2016-01-21 | Disposition: A | Discharge status: Home / Self Care | Payer: Medicare Other | Source: Ambulatory Visit | Attending: Family | Admitting: Family

## 2016-01-21 DIAGNOSIS — Z0000 Encounter for general adult medical examination without abnormal findings: Secondary | ICD-10-CM | POA: Insufficient documentation

## 2016-01-21 DIAGNOSIS — Z13820 Encounter for screening for osteoporosis: Secondary | ICD-10-CM | POA: Diagnosis not present

## 2016-01-21 DIAGNOSIS — M810 Age-related osteoporosis without current pathological fracture: Secondary | ICD-10-CM | POA: Insufficient documentation

## 2016-01-21 DIAGNOSIS — Z1231 Encounter for screening mammogram for malignant neoplasm of breast: Secondary | ICD-10-CM | POA: Diagnosis not present

## 2016-01-21 DIAGNOSIS — Z780 Asymptomatic menopausal state: Secondary | ICD-10-CM | POA: Diagnosis not present

## 2016-02-12 ENCOUNTER — Ambulatory Visit (INDEPENDENT_AMBULATORY_CARE_PROVIDER_SITE_OTHER): Payer: Medicare Other | Admitting: Family

## 2016-02-12 ENCOUNTER — Encounter: Payer: Self-pay | Admitting: Family

## 2016-02-12 DIAGNOSIS — M810 Age-related osteoporosis without current pathological fracture: Secondary | ICD-10-CM | POA: Diagnosis not present

## 2016-02-12 NOTE — Assessment & Plan Note (Addendum)
Patient and I had long discussion about benefits and risk of medications to treat osteoporosis. She was unable to tolerate treatment with Boniva and Fosamax. She was considering proloa however she informed me she has dental work that she plans on doing 2018. We jointly agreed to hold off on starting for prolia until after any dental procedures in the near future. Patient will follow back up in 3-6 months.

## 2016-02-12 NOTE — Patient Instructions (Signed)
Let's discuss prolia again in the spring after dental appointment.   Pleasure seeing you.

## 2016-02-12 NOTE — Progress Notes (Signed)
Subjective:    Patient ID: Rachel PetersJane Macias, female    DOB: 1945/11/11, 70 y.o.   MRN: [id number]  CC: Rachel Macias is a 70 y.o. female who presents today for follow up.   HPI: Here to discuss treatment for osteoporosis.  Would like to discuss Prolia. Had been on fosamax and boniva. Plans for dental work in 3 months. Had trouble taking fosamax and caused gaging, choking.   No low calcium, h/o pancreatitis or malignancy  Strong family h/o OP. No fracture history    HISTORY:  Past Medical History:  Diagnosis Date  . Bipolar 1 disorder (HCC) 1995   Dr. Caryn SectionAarti Kapur [phone])  . Ex-smoker 2001   minimal  . Osteoporosis 2015   fosamax reaction, then was on boniva IV for ~1 yr  . Postmenopausal    age 70  . Vitamin D deficiency    Past Surgical History:  Procedure Laterality Date  . CESAREAN SECTION    . DEXA  09/2013   T -2.8 femur  . TUBAL LIGATION     with reversal   Family History  Problem Relation Age of Onset  . Thyroid disease Other     runs in family  . Diabetes Brother   . Hypertension Brother   . Stroke Neg Hx   . CAD Neg Hx   . Cancer Neg Hx     Allergies: Amoxicillin and Fosamax [alendronate sodium] Current Outpatient Prescriptions on File Prior to Visit  Medication Sig Dispense Refill  . cholecalciferol (VITAMIN D) 1000 UNITS tablet Take 2 tablets (2,000 Units total) by mouth daily.    Marland Kitchen. FLUoxetine (PROZAC) 20 MG capsule Take 20 mg by mouth daily.    Marland Kitchen. lithium carbonate 300 MG capsule     . simvastatin (ZOCOR) 20 MG tablet Take 1 tablet (20 mg total) by mouth every evening. 90 tablet 3   No current facility-administered medications on file prior to visit.     Social History  Substance Use Topics  . Smoking status: Former Smoker    Quit date: 03/10/1999  . Smokeless tobacco: Never Used  . Alcohol use No    Review of Systems  Constitutional: Negative for chills and fever.  Respiratory: Negative for cough.   Cardiovascular: Negative for chest  pain and palpitations.  Gastrointestinal: Negative for nausea and vomiting.      Objective:    BP (!) 146/84   Pulse 67   Temp 98.3 F (36.8 C) (Oral)   Ht 5\' 5"  (1.651 m)   Wt 157 lb 6.4 oz (71.4 kg)   SpO2 96%   BMI 26.19 kg/m  BP Readings from Last 3 Encounters:  02/12/16 (!) 146/84  12/12/15 140/90  10/12/14 126/82   Wt Readings from Last 3 Encounters:  02/12/16 157 lb 6.4 oz (71.4 kg)  12/12/15 155 lb 12 oz (70.6 kg)  10/12/14 152 lb (68.9 kg)    Physical Exam  Constitutional: She appears well-developed and well-nourished.  Eyes: Conjunctivae are normal.  Cardiovascular: Normal rate, regular rhythm, normal heart sounds and normal pulses.   Pulmonary/Chest: Effort normal and breath sounds normal. She has no wheezes. She has no rhonchi. She has no rales.  Neurological: She is alert.  Skin: Skin is warm and dry.  Psychiatric: She has a normal mood and affect. Her speech is normal and behavior is normal. Thought content normal.  Vitals reviewed.      Assessment & Plan:   Problem List Items Addressed This Visit  Musculoskeletal and Integument   Osteoporosis    Patient and I haf long discussion about benefits and risk of medications to treat osteoporosis. She was unable to tolerate treatment with Boniva and Fosamax. She was considering proloa however she informed me she has dental work that she plans on doing 2018. We jointly agreed to hold off on starting for prolia until after any dental procedures in the near future. Patient will follow back up in 3-6 months.          I am having Ms. Hankerson maintain her FLUoxetine, cholecalciferol, lithium carbonate, and simvastatin.   No orders of the defined types were placed in this encounter.   Return precautions given.   Risks, benefits, and alternatives of the medications and treatment plan prescribed today were discussed, and patient expressed understanding.   Education regarding symptom management and  diagnosis given to patient on AVS.  Continue to follow with Rennie PlowmanMargaret Shanetra Blumenstock, FNP for routine health maintenance.   Rachel PetersJane Phenix and I agreed with plan.   Rennie PlowmanMargaret Rocquel Askren, FNP

## 2016-02-12 NOTE — Progress Notes (Signed)
Pre visit review using our clinic review tool, if applicable. No additional management support is needed unless otherwise documented below in the visit note. 

## 2016-04-01 DIAGNOSIS — F3174 Bipolar disorder, in full remission, most recent episode manic: Secondary | ICD-10-CM | POA: Diagnosis not present

## 2016-05-29 ENCOUNTER — Ambulatory Visit (INDEPENDENT_AMBULATORY_CARE_PROVIDER_SITE_OTHER): Payer: Medicare Other | Admitting: Family Medicine

## 2016-05-29 ENCOUNTER — Encounter: Payer: Self-pay | Admitting: Family Medicine

## 2016-05-29 ENCOUNTER — Telehealth: Payer: Self-pay | Admitting: Family Medicine

## 2016-05-29 ENCOUNTER — Ambulatory Visit (INDEPENDENT_AMBULATORY_CARE_PROVIDER_SITE_OTHER): Payer: Medicare Other

## 2016-05-29 VITALS — BP 130/80 | HR 99 | Temp 98.5°F | Ht 65.0 in | Wt 161.0 lb

## 2016-05-29 DIAGNOSIS — R300 Dysuria: Secondary | ICD-10-CM | POA: Diagnosis not present

## 2016-05-29 DIAGNOSIS — M545 Low back pain, unspecified: Secondary | ICD-10-CM

## 2016-05-29 DIAGNOSIS — M5136 Other intervertebral disc degeneration, lumbar region: Secondary | ICD-10-CM | POA: Diagnosis not present

## 2016-05-29 DIAGNOSIS — N3000 Acute cystitis without hematuria: Secondary | ICD-10-CM | POA: Diagnosis not present

## 2016-05-29 DIAGNOSIS — R251 Tremor, unspecified: Secondary | ICD-10-CM | POA: Diagnosis not present

## 2016-05-29 LAB — POCT URINALYSIS DIPSTICK
BILIRUBIN UA: NEGATIVE
Glucose, UA: NEGATIVE
KETONES UA: NEGATIVE
Nitrite, UA: NEGATIVE
Protein, UA: NEGATIVE
Spec Grav, UA: 1.005 (ref 1.030–1.035)
Urobilinogen, UA: 0.2 (ref ?–2.0)
pH, UA: 6 (ref 5.0–8.0)

## 2016-05-29 IMAGING — DX DG LUMBAR SPINE 2-3V
3 series · 3 of 3 positions shown · non-contrast
Comparison: None.

CLINICAL DATA: Low back pain.

EXAM:
LUMBAR SPINE - 2-3 VIEW

[lumbar spine ap]
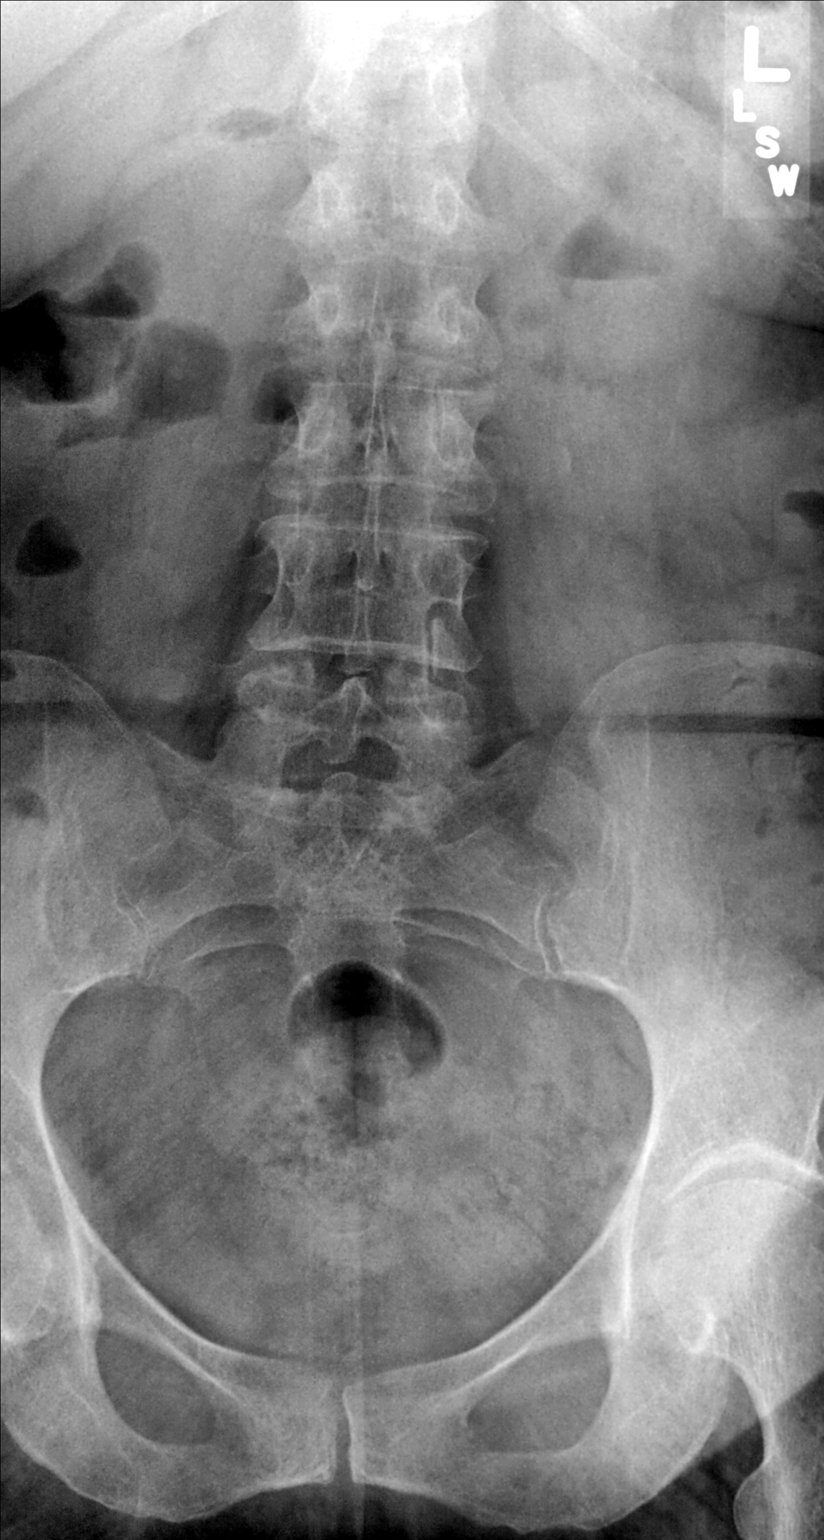

[lumbar spine lat]
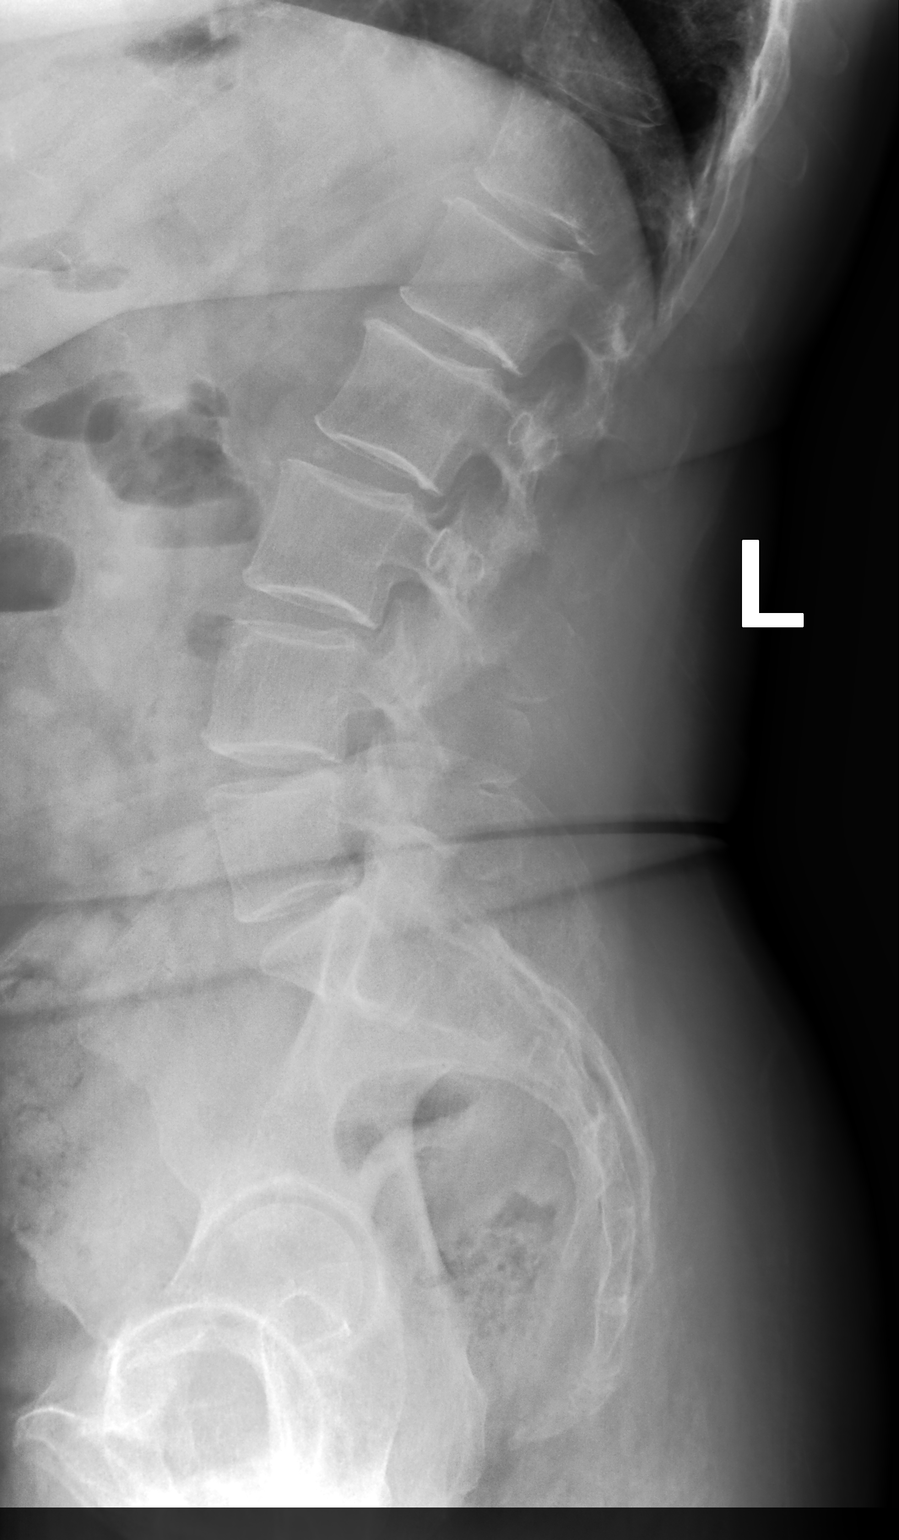

[lumbar spot lat]
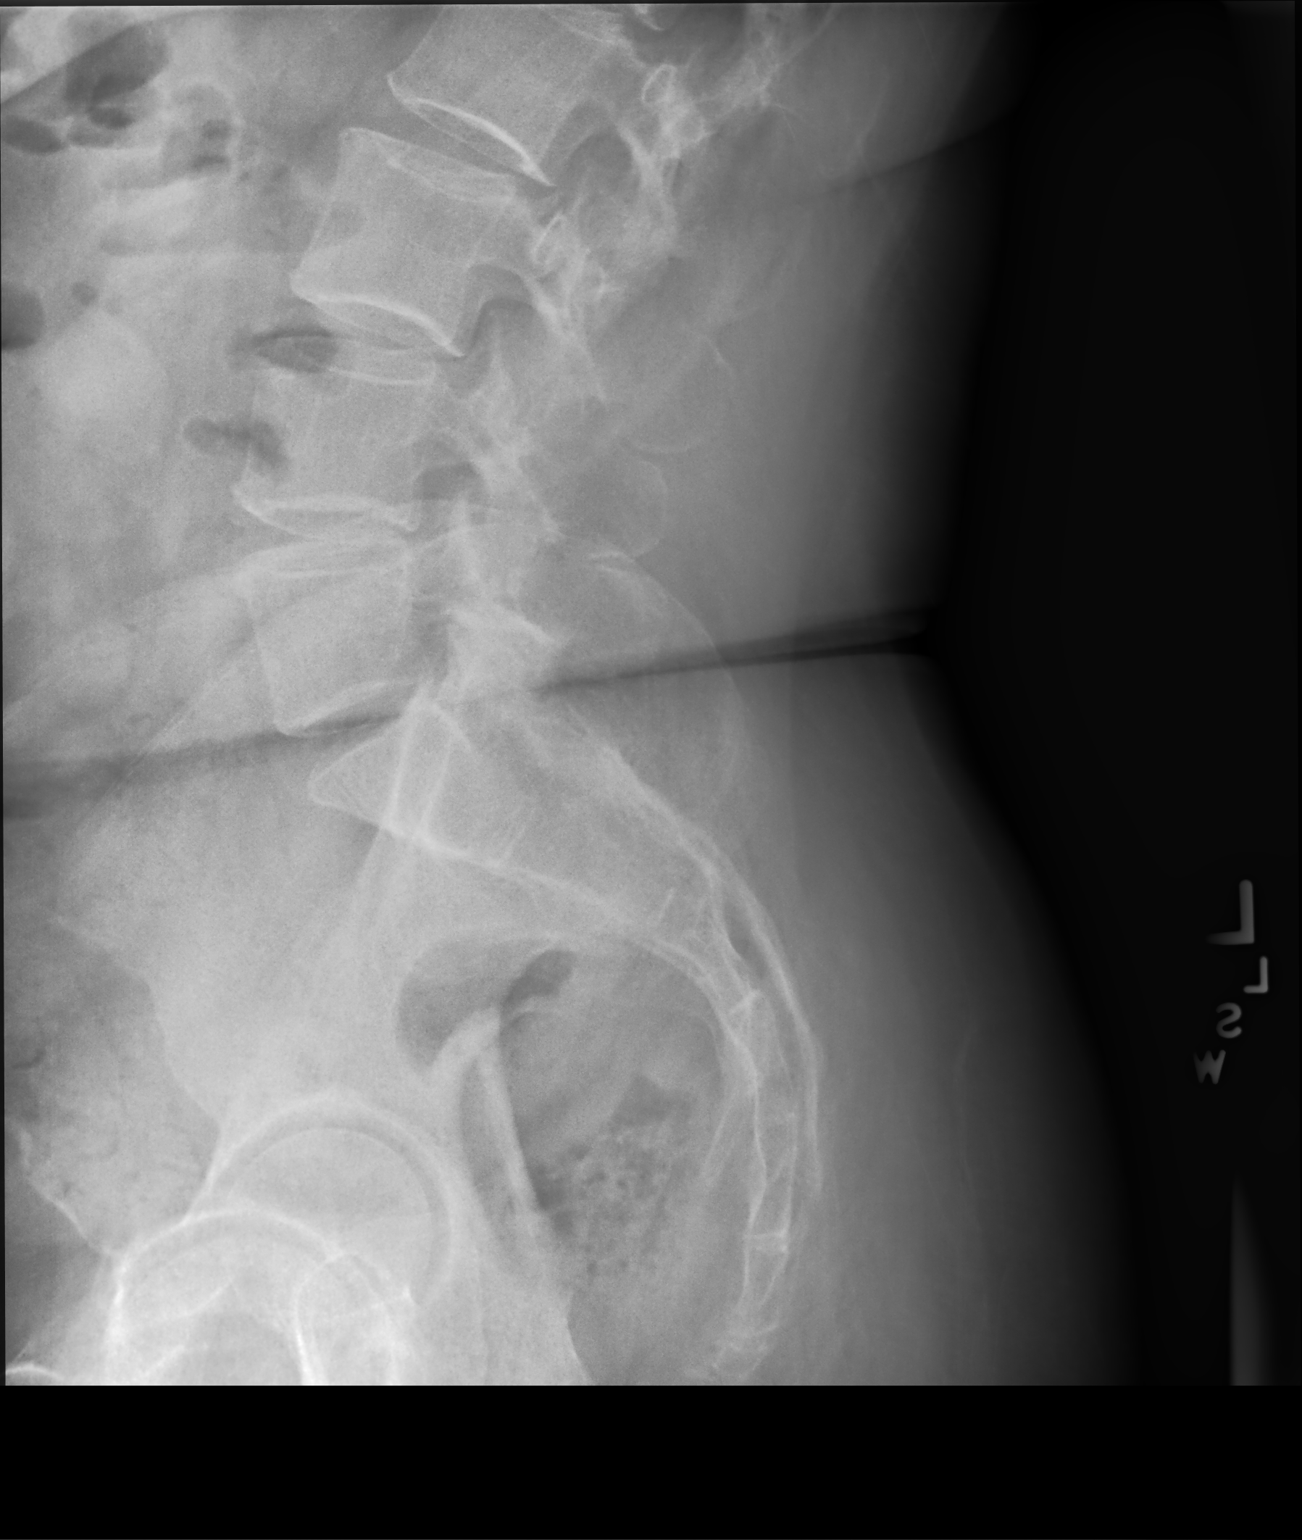

[3 of 3 positions shown; findings below may reference images not displayed]

FINDINGS: There are 5 non rib-bearing lumbar type vertebral bodies.

Normal alignment of lumbar spine. No anterolisthesis or
retrolisthesis.

Lumbar vertebral body heights appear preserved.

Mild multilevel lumbar spine DDD, potentially worse at L1-L2 with
disc space height loss, endplate irregularity and sclerosis.

Limited visualization of the bilateral SI joints and hips is normal.

Regional bowel gas pattern and soft tissues are normal.
IMPRESSION: 1. No acute findings.
2. Mild multilevel lumbar spine DDD.

## 2016-05-29 MED ORDER — SULFAMETHOXAZOLE-TRIMETHOPRIM 800-160 MG PO TABS: 1.0000 | ORAL_TABLET | Freq: Two times a day (BID) | ORAL | 0 refills | Status: DC

## 2016-05-29 NOTE — Telephone Encounter (Signed)
Place on Dr.Cook's 3 p.m.

## 2016-05-29 NOTE — Progress Notes (Signed)
Subjective:  Patient ID: Rachel PetersJane Seabolt, female    DOB: 1945-12-09  Age: 71 y.o. MRN: [id number]  CC: Urinary urgency and frequency, low back pain  HPI:  71 year old female with bipolar disorder presents with the above complaints.  Patient reports a 5 day history of urinary urgency and frequency. No dysuria. No associated fever or chills. No nausea vomiting. She does report severe low back pain. She does not recall any fall, trauma, injury. She has taken several over-the-counter NSAIDs and some Vicodin she had at home with little improvement in her low back pain. Worse with range of motion. No known relieving factors. No other complaints or concerns at this time.  Social Hx   Social History   Social History  . Marital status: Widowed    Spouse name: N/A  . Number of children: N/A  . Years of education: N/A   Social History Main Topics  . Smoking status: Former Smoker    Quit date: 03/10/1999  . Smokeless tobacco: Never Used  . Alcohol use No  . Drug use: No  . Sexual activity: Not Asked   Other Topics Concern  . None   Social History Narrative   Lives alone with 2 dogs, widow   30 yrs ago son died at age 71yo (hit by car).   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   Genitourinary: Positive for frequency and urgency. Negative for dysuria.  Musculoskeletal: Positive for back pain.    Objective:  BP 130/80   Pulse 99   Temp 98.5 F (36.9 C) (Oral)   Ht 5\' 5"  (1.651 m)   Wt 161 lb (73 kg)   SpO2 98%   BMI 26.79 kg/m   BP/Weight 05/29/2016 02/12/2016 12/12/2015  Systolic BP 130 146 140  Diastolic BP 80 84 90  Wt. (Lbs) 161 157.4 155.75  BMI 26.79 26.19 25.92   Physical Exam  Constitutional: She is oriented to person, place, and time. She appears  well-developed. No distress.  Cardiovascular: Normal rate and regular rhythm.   Pulmonary/Chest: Effort normal and breath sounds normal.  Abdominal: Soft. She exhibits no distension. There is no tenderness.  Musculoskeletal:  Lumbar spine - no areas of tenderness.  Neurological: She is alert and oriented to person, place, and time.  Tremor noted.  Vitals reviewed.   Lab Results  Component Value Date   WBC 10.2 12/24/2015   HGB 13.3 12/24/2015   HCT 39.1 12/24/2015   PLT 422.0 (H) 12/24/2015   GLUCOSE 110 (H) 12/24/2015   CHOL 289 (H) 12/24/2015   TRIG 236.0 (H) 12/24/2015   HDL 43.60 12/24/2015   LDLDIRECT 207.0 12/24/2015   LDLCALC 202 (H) 08/15/2013   ALT 14 12/24/2015   AST 15 12/24/2015   NA 140 12/24/2015   K 4.1 12/24/2015   CL 104 12/24/2015   CREATININE 1.10 12/24/2015   BUN 16 12/24/2015   CO2 29 12/24/2015   TSH 3.30 12/24/2015   HGBA1C 5.1 12/24/2015    Assessment & Plan:   Problem List Items Addressed This Visit    Acute cystitis without hematuria - Primary    New problem. Trace lyse blood and moderate leukocytes on UA. Treating empirically for UTI while awaiting culture. Treating with Bactrim.  Relevant Orders   POCT Urinalysis Dipstick (Completed)   Urine Culture   Acute bilateral low back pain without sciatica    New problem. Possibly from UTI. Xray today.      Relevant Orders   DG Lumbar Spine 2-3 Views   Tremor    Noted on exam. Patient adamant that this is not related to her lithium. She states that she follows with psychiatry and has had a lithium level in the past 6 months.         Meds ordered this encounter  Medications  . sulfamethoxazole-trimethoprim (BACTRIM DS,SEPTRA DS) 800-160 MG tablet    Sig: Take 1 tablet by mouth 2 (two) times daily.    Dispense:  10 tablet    Refill:  0    Follow-up: PRN  Everlene Other DO [facility]

## 2016-05-29 NOTE — Progress Notes (Signed)
Pre visit review using our clinic review tool, if applicable. No additional management support is needed unless otherwise documented below in the visit note. 

## 2016-05-29 NOTE — Patient Instructions (Signed)
Bactrim twice daily x 5 days.  We will call with the culture results.  Take care  Dr. Adriana Simasook

## 2016-05-29 NOTE — Assessment & Plan Note (Signed)
Noted on exam. Patient adamant that this is not related to her lithium. She states that she follows with psychiatry and has had a lithium level in the past 6 months.

## 2016-05-29 NOTE — Assessment & Plan Note (Addendum)
New problem. Trace lyse blood and moderate leukocytes on UA. Treating empirically for UTI while awaiting culture. Treating with Bactrim.

## 2016-05-29 NOTE — Assessment & Plan Note (Signed)
New problem. Possibly from UTI. Xray today.

## 2016-05-29 NOTE — Telephone Encounter (Signed)
Patient called requesting to be checked for a UTI.

## 2016-05-31 LAB — URINE CULTURE

## 2016-06-01 ENCOUNTER — Ambulatory Visit: Payer: No Typology Code available for payment source | Admitting: Family

## 2016-06-01 ENCOUNTER — Telehealth: Payer: Self-pay

## 2016-06-01 NOTE — Telephone Encounter (Signed)
-----   Message from Tommie SamsJayce G Cook, DO sent at 06/01/2016  3:30 PM EDT ----- Xray with multi-level DDD.Marland Kitchen. Needs follow up with PCP.

## 2016-06-02 ENCOUNTER — Telehealth: Payer: Self-pay | Admitting: Family

## 2016-06-02 NOTE — Telephone Encounter (Signed)
See chart. Message sent yesterday to [facility].

## 2016-06-02 NOTE — Telephone Encounter (Signed)
Pt called requesting x ray results. Please advise, thank you!  Call pt @ [phone]

## 2016-06-02 NOTE — Telephone Encounter (Signed)
I don't see that this was read yet, it was ordered by Dr. Adriana Simasook, but is arnetts patient.  Please advise, thanks

## 2016-06-04 ENCOUNTER — Telehealth: Payer: Self-pay | Admitting: *Deleted

## 2016-06-04 NOTE — Telephone Encounter (Signed)
pts wants to know what she can do to help with pain.

## 2016-06-04 NOTE — Telephone Encounter (Signed)
Patient has requested Xray results  Pt contact [phone]

## 2016-06-04 NOTE — Telephone Encounter (Signed)
Per note on 06/01/16 pt had degenerative disc disease. Pt has called three times this week for results that were in chart.

## 2016-06-04 NOTE — Telephone Encounter (Signed)
Needs follow up with PCP

## 2016-06-04 NOTE — Telephone Encounter (Signed)
FYI. Please see last note. Unsure of what was said.

## 2016-06-08 NOTE — Telephone Encounter (Signed)
Call pt  Advise f/u app

## 2016-06-09 NOTE — Telephone Encounter (Signed)
Patient stated she does not want to have appointment.  She is managing on her on.

## 2016-07-01 DIAGNOSIS — F3174 Bipolar disorder, in full remission, most recent episode manic: Secondary | ICD-10-CM | POA: Diagnosis not present

## 2016-07-10 DIAGNOSIS — F3174 Bipolar disorder, in full remission, most recent episode manic: Secondary | ICD-10-CM | POA: Diagnosis not present

## 2016-07-13 DIAGNOSIS — F3174 Bipolar disorder, in full remission, most recent episode manic: Secondary | ICD-10-CM | POA: Diagnosis not present

## 2016-08-04 DIAGNOSIS — Z79899 Other long term (current) drug therapy: Secondary | ICD-10-CM | POA: Diagnosis not present

## 2016-08-04 DIAGNOSIS — F3174 Bipolar disorder, in full remission, most recent episode manic: Secondary | ICD-10-CM | POA: Diagnosis not present

## 2016-08-10 DIAGNOSIS — F3174 Bipolar disorder, in full remission, most recent episode manic: Secondary | ICD-10-CM | POA: Diagnosis not present

## 2016-08-10 DIAGNOSIS — Z79899 Other long term (current) drug therapy: Secondary | ICD-10-CM | POA: Diagnosis not present

## 2016-08-14 DIAGNOSIS — F3174 Bipolar disorder, in full remission, most recent episode manic: Secondary | ICD-10-CM | POA: Diagnosis not present

## 2016-08-14 DIAGNOSIS — Z79899 Other long term (current) drug therapy: Secondary | ICD-10-CM | POA: Diagnosis not present

## 2016-08-15 ENCOUNTER — Emergency Department
Admission: EM | Admit: 2016-08-15 | Discharge: 2016-08-17 | Disposition: A | Discharge status: Other Acute Inpatient Hospital | Payer: Medicare Other | Attending: Emergency Medicine | Admitting: Emergency Medicine

## 2016-08-15 ENCOUNTER — Encounter: Payer: Self-pay | Admitting: Emergency Medicine

## 2016-08-15 DIAGNOSIS — Z79899 Other long term (current) drug therapy: Secondary | ICD-10-CM | POA: Diagnosis not present

## 2016-08-15 DIAGNOSIS — F302 Manic episode, severe with psychotic symptoms: Secondary | ICD-10-CM | POA: Insufficient documentation

## 2016-08-15 DIAGNOSIS — F29 Unspecified psychosis not due to a substance or known physiological condition: Secondary | ICD-10-CM | POA: Diagnosis present

## 2016-08-15 DIAGNOSIS — Z87891 Personal history of nicotine dependence: Secondary | ICD-10-CM | POA: Insufficient documentation

## 2016-08-15 DIAGNOSIS — J9811 Atelectasis: Secondary | ICD-10-CM | POA: Diagnosis not present

## 2016-08-15 DIAGNOSIS — F309 Manic episode, unspecified: Secondary | ICD-10-CM

## 2016-08-15 DIAGNOSIS — F312 Bipolar disorder, current episode manic severe with psychotic features: Secondary | ICD-10-CM | POA: Diagnosis not present

## 2016-08-15 DIAGNOSIS — R9431 Abnormal electrocardiogram [ECG] [EKG]: Secondary | ICD-10-CM | POA: Diagnosis not present

## 2016-08-15 LAB — SALICYLATE LEVEL: Salicylate Lvl: <7 mg/dL (ref 2.8–30.0)

## 2016-08-15 LAB — COMPREHENSIVE METABOLIC PANEL
ALBUMIN: 3.9 g/dL (ref 3.5–5.0)
ALK PHOS: 48 U/L (ref 38–126)
ALT: 15 U/L (ref 14–54)
AST: 18 U/L (ref 15–41)
Anion gap: 8 (ref 5–15)
BILIRUBIN TOTAL: 0.6 mg/dL (ref 0.3–1.2)
BUN: 22 mg/dL — AB (ref 6–20)
CALCIUM: 9.5 mg/dL (ref 8.9–10.3)
CO2: 25 mmol/L (ref 22–32)
CREATININE: 0.99 mg/dL (ref 0.44–1.00)
Chloride: 107 mmol/L (ref 101–111)
GFR calc Af Amer: >60 mL/min (ref >60)
GFR calc non Af Amer: 56 mL/min — ABNORMAL LOW (ref >60)
GLUCOSE: 105 mg/dL — AB (ref 65–99)
Potassium: 3.9 mmol/L (ref 3.5–5.1)
SODIUM: 140 mmol/L (ref 135–145)
Total Protein: 7.6 g/dL (ref 6.5–8.1)

## 2016-08-15 LAB — URINALYSIS, COMPLETE (UACMP) WITH MICROSCOPIC
BACTERIA UA: NONE SEEN
Bilirubin Urine: NEGATIVE
GLUCOSE, UA: NEGATIVE mg/dL
KETONES UR: NEGATIVE mg/dL
Leukocytes, UA: NEGATIVE
Nitrite: NEGATIVE
PROTEIN: NEGATIVE mg/dL
Specific Gravity, Urine: 1.004 — ABNORMAL LOW (ref 1.005–1.030)
pH: 7 (ref 5.0–8.0)

## 2016-08-15 LAB — CBC
HEMATOCRIT: 35 % (ref 35.0–47.0)
HEMOGLOBIN: 12.2 g/dL (ref 12.0–16.0)
MCH: 32.1 pg (ref 26.0–34.0)
MCHC: 34.9 g/dL (ref 32.0–36.0)
MCV: 92 fL (ref 80.0–100.0)
Platelets: 286 10*3/uL (ref 150–440)
RBC: 3.81 MIL/uL (ref 3.80–5.20)
RDW: 12.7 % (ref 11.5–14.5)
WBC: 8.3 10*3/uL (ref 3.6–11.0)

## 2016-08-15 LAB — ACETAMINOPHEN LEVEL: Acetaminophen (Tylenol), Serum: <10 ug/mL — ABNORMAL LOW (ref 10–30)

## 2016-08-15 LAB — ETHANOL: Alcohol, Ethyl (B): <5 mg/dL (ref <5)

## 2016-08-15 LAB — LITHIUM LEVEL
Lithium Lvl: 2.1 mmol/L (ref 0.60–1.20)
Lithium Lvl: 1.4 mmol/L — ABNORMAL HIGH (ref 0.60–1.20)

## 2016-08-15 MED ORDER — SODIUM CHLORIDE 0.9 % IV BOLUS (SEPSIS)
1000.0000 mL | Freq: Once | INTRAVENOUS | Status: AC
  Administered 2016-08-15: 1000 mL via INTRAVENOUS

## 2016-08-15 MED ORDER — OLANZAPINE 10 MG IM SOLR
5.0000 mg | Freq: Four times a day (QID) | INTRAMUSCULAR | Status: DC | PRN
  Filled 2016-08-15: qty 10

## 2016-08-15 MED ORDER — OLANZAPINE 5 MG PO TABS
5.0000 mg | ORAL_TABLET | Freq: Once | ORAL | Status: AC
  Administered 2016-08-15: 5 mg via ORAL
  Filled 2016-08-15: qty 1

## 2016-08-15 MED ORDER — OLANZAPINE 5 MG PO TABS
5.0000 mg | ORAL_TABLET | ORAL | Status: DC
  Administered 2016-08-15 – 2016-08-17 (×4): 5 mg via ORAL
  Filled 2016-08-15 (×4): qty 1

## 2016-08-15 NOTE — ED Notes (Signed)
Pt still wearing wig in hallway, MD informed.

## 2016-08-15 NOTE — ED Notes (Signed)
Bottom scrubs changed.

## 2016-08-15 NOTE — ED Provider Notes (Signed)
SOC has seen and evaluated patient. Recommends inpatient psychiatric admission. Did have medication recommendations which I will order.   Phineas SemenGoodman, Annika Selke, MD 08/15/16 2242

## 2016-08-15 NOTE — ED Notes (Signed)
Report off to ann rn 

## 2016-08-15 NOTE — ED Notes (Signed)
BEHAVIORAL HEALTH ROUNDING  Patient sleeping: No.  Patient alert and oriented: yes  Behavior appropriate: Yes. ; If no, describe:  Nutrition and fluids offered: Yes  Toileting and hygiene offered: Yes  Sitter present: not applicable, Q 15 min safety rounds and observation.  Law enforcement present: Yes ODS  

## 2016-08-15 NOTE — ED Notes (Signed)
ENVIRONMENTAL ASSESSMENT  Potentially harmful objects out of patient reach: Yes.  Personal belongings secured: Yes.  Patient dressed in hospital provided attire only: Yes.  Plastic bags out of patient reach: Yes.  Patient care equipment (cords, cables, call bells, lines, and drains) shortened, removed, or accounted for: Yes.  Equipment and supplies removed from bottom of stretcher: Yes.  Potentially toxic materials out of patient reach: Yes.  Sharps container removed or out of patient reach: Yes.   BEHAVIORAL HEALTH ROUNDING  Patient sleeping: No.  Patient alert and oriented: yes to her baseline of self and place.  Behavior appropriate: Yes. ; If no, describe:  Nutrition and fluids offered: Yes  Toileting and hygiene offered: Yes  Sitter present: not applicable, Q 15 min safety rounds and observation.  Law enforcement present: Yes ODS

## 2016-08-15 NOTE — ED Notes (Signed)
Pt eating dinner tray now 

## 2016-08-15 NOTE — ED Notes (Signed)
PT IVC/ PENDING PLACEMENT  

## 2016-08-15 NOTE — ED Notes (Signed)
Patient Sister: Marcie MowersMartha Folgleman [phone], [phone]

## 2016-08-15 NOTE — ED Notes (Signed)
Langston MaskerSHAEVITZ, MD at bedside

## 2016-08-15 NOTE — ED Notes (Signed)
Gave patient a meal tray. 

## 2016-08-15 NOTE — ED Notes (Signed)
Pt pulled iv out.  Dr Derrill Kaygoodman aware.  Pt drinking water   Soc done.

## 2016-08-15 NOTE — ED Provider Notes (Addendum)
[facility] Emergency Department Provider Note  ____________________________________________   First MD Initiated Contact with Patient 08/15/16 1222     (approximate)  I have reviewed the triage vital signs and the nursing notes.   HISTORY  Chief Complaint Psychiatric Evaluation   HPI Rachel Macias is a 71 y.o. female with a history of bipolar disorder on lithium who is presenting to the emergency department today with psychosis. I received a call from her psychiatrist, Dr. Maryruth Bun, who says that the patient continues to be on lithium and is taken 2 doses of her lithium as well as her Zyprexa over the past 12 hours. She says that the patient appeared in a hypomanic state yesterday when she had evaluated the patient herself. However, per the triage note the patient has been having hallucinationsand being verbally abusive at home. When asked the patient was bringing her into the emergency department today she becomes very tangential, talking about "fibroids" in her breasts. She denies having any hallucinations. Does not report any suicidal or homicidal ideation.   Past Medical History:  Diagnosis Date  . Bipolar 1 disorder (HCC) 1995   Dr. Caryn Section [phone])  . Ex-smoker 2001   minimal  . Osteoporosis 2015   fosamax reaction, then was on boniva IV for ~1 yr  . Postmenopausal    age 35  . Vitamin D deficiency     Patient Active Problem List   Diagnosis Date Noted  . Acute cystitis without hematuria 05/29/2016  . Acute bilateral low back pain without sciatica 05/29/2016  . Tremor 05/29/2016  . Routine physical examination 12/12/2015  . Dyslipidemia 08/27/2014  . Vitamin D deficiency   . Thyroid function test abnormal 05/16/2013  . Osteoporosis 05/16/2013  . Bipolar disorder, in full remission, most recent episode manic [facility])     Past Surgical History:  Procedure Laterality Date  . CESAREAN SECTION    . DEXA  09/2013   T -2.8 femur  . TUBAL  LIGATION     with reversal    Prior to Admission medications   Medication Sig Start Date End Date Taking? Authorizing Provider  cholecalciferol (VITAMIN D) 1000 UNITS tablet Take 2 tablets (2,000 Units total) by mouth daily. 05/18/13   Eustaquio Boyden, MD  FLUoxetine (PROZAC) 20 MG capsule Take 20 mg by mouth daily.    [provider]  lithium carbonate 300 MG capsule  09/18/15   [provider]  simvastatin (ZOCOR) 20 MG tablet Take 1 tablet (20 mg total) by mouth every evening. 01/05/16   Allegra Grana, FNP  sulfamethoxazole-trimethoprim (BACTRIM DS,SEPTRA DS) 800-160 MG tablet Take 1 tablet by mouth 2 (two) times daily. 05/29/16   Tommie Sams, DO    Allergies Amoxicillin and Fosamax [alendronate sodium]  Family History  Problem Relation Age of Onset  . Thyroid disease Other        runs in family  . Diabetes Brother   . Hypertension Brother   . Stroke Neg Hx   . CAD Neg Hx   . Cancer Neg Hx     Social History Social History  Substance Use Topics  . Smoking status: Former Smoker    Quit date: 03/10/1999  . Smokeless tobacco: Never Used  . Alcohol use No    Review of Systems  Constitutional: No fever/chills Eyes: No visual changes. ENT: No sore throat. Cardiovascular: Denies chest pain. Respiratory: Denies shortness of breath. Gastrointestinal: No abdominal pain.  No nausea, no vomiting.  No diarrhea.  No constipation. Genitourinary: Negative for dysuria. Musculoskeletal: Negative for back pain. Skin: Negative for rash. Neurological: Negative for headaches, focal weakness or numbness.   ____________________________________________   PHYSICAL EXAM:  VITAL SIGNS: ED Triage Vitals  Enc Vitals Group     BP 08/15/16 1158 (!) 155/90     Pulse Rate 08/15/16 1158 (!) 114     Resp 08/15/16 1158 16     Temp 08/15/16 1158 98.8 F (37.1 C)     Temp Source 08/15/16 1158 Oral     SpO2 08/15/16 1158 97 %     Weight 08/15/16 1159 161 lb (73 kg)       Height --      Head Circumference --      Peak Flow --      Pain Score --      Pain Loc --      Pain Edu? --      Excl. in GC? --     Constitutional: Alert and oriented. in no acute distress. Eyes: Conjunctivae are normal.  Head: Atraumatic. Nose: No congestion/rhinnorhea. Mouth/Throat: Mucous membranes are moist.  Neck: No stridor.   Cardiovascular: Normal rate, regular rhythm. Grossly normal heart sounds.  Respiratory: Normal respiratory effort.  No retractions. Lungs CTAB. Gastrointestinal: Soft and nontender. No distention.  Musculoskeletal: No lower extremity tenderness nor edema.  No joint effusions. Neurologic:  Normal speech and language. No gross focal neurologic deficits are appreciated. Skin:  Skin is warm, dry and intact. No rash noted. Psychiatric: Patient with pressured speech and tangential.  ____________________________________________   LABS (all labs ordered are listed, but only abnormal results are displayed)  Labs Reviewed  COMPREHENSIVE METABOLIC PANEL - Abnormal; Notable for the following:       Result Value   Glucose, Bld 105 (*)    BUN 22 (*)    GFR calc non Af Amer 56 (*)    All other components within normal limits  ACETAMINOPHEN LEVEL - Abnormal; Notable for the following:    Acetaminophen (Tylenol), Serum <10 (*)    All other components within normal limits  ETHANOL  SALICYLATE LEVEL  CBC  URINE DRUG SCREEN, QUALITATIVE ([facility] ONLY)  LITHIUM LEVEL  URINALYSIS, COMPLETE (UACMP) WITH MICROSCOPIC   ____________________________________________  EKG   ____________________________________________  RADIOLOGY   ____________________________________________   PROCEDURES  Procedure(s) performed:   Procedures  Critical Care performed:   ____________________________________________   INITIAL IMPRESSION / ASSESSMENT AND PLAN / ED COURSE  Pertinent labs & imaging results that were available during my care of the patient were  reviewed by me and considered in my medical decision making (see chart for details).  I will involuntarily commit the patient for psychosis. She'll be evaluated by the tele-psychiatrist.      ____________________________________________   FINAL CLINICAL IMPRESSION(S) / ED DIAGNOSES  Mania    NEW MEDICATIONS STARTED DURING THIS VISIT:  New Prescriptions   No medications on file     Note:  This document was prepared using Dragon voice recognition software and may include unintentional dictation errors.     Myrna Blazer, MD 08/15/16 1402  I discussed the case with the [facility] who recommended rechecking a lithium level in 6 hours after fluid hydration.  ED ECG REPORT I, Arelia Longest, the attending physician, personally viewed and interpreted this ECG.   Date: 08/15/2016  EKG Time: 1430  Rate: 98  Rhythm: normal sinus rhythm  Axis: Normal  Intervals:none  ST&T Change: No ST segment  elevation or depression. No abnormal T-wave inversion. Nonspecific ST and T wave abnormality read by the EKG machine. However, I believe this is more likely due to baseline disturbance in the lateral leads.  Reassuring intervals on EKG.      Myrna BlazerSchaevitz, David Matthew, MD 08/15/16 1501

## 2016-08-15 NOTE — Progress Notes (Signed)
TTS spoke with Dr Maryruth BunKapur who called requesting an update.  When told that [facility], Dr Maryruth BunKapur asked that pt be faxed to other facilities in effort to locate bed more quickly.   Referral faxed to [facility] [place] [place]. Garner NashGregory Larue Drawdy, MSW, LCSW Clinical Social Worker 08/15/2016 9:35 PM

## 2016-08-15 NOTE — ED Triage Notes (Signed)
Pt to ed with family who states that pt took a double dose of her meds today for psychosis, however, family reports that pt has continued to have confusion, is verbally abusive, having hallucinations, and has pressured speech.  Pt alert at triage but it is difficult to follow thoughts and ideas of patient.  Pt is cooperative at this time.

## 2016-08-15 NOTE — BH Assessment (Signed)
Assessment Note  Rachel Macias is an 71 y.o. female. Who presents to the ER accompanied by her sister. Pt with a history of bipolar disorder on lithium who is presenting to the emergency department today with psychosis. Pt presents as hypomanic and confused. Pt oriented to time and self only.  Pt was rambling throughout the assessment and often went off into tangents giving inconsistent responses. Writer was unable to complete a thorough face to face assessment due to AMS.  Due to pts present mental status the following information was obtain by reviewing the pts charts, and consulting with collateral resources. CM has spoken with the pts sister Marcie Mowers @ [phone], who states that patient diagnosed with diagnosis of Bipolar in 1984. Patient has no previous suicide attempts although the patient has recnetly began expressing suicidal thoughts, on set 6 - 8 -18. Patient currently lives alone and is at baseline independent and highly functioning. Patient has two previous inpatient admissions one which occurred in 55 in [place], after the death of her child and another in 64 in [place]. Peer the patients sisters' report the patient has been on lithium and prozac for many years. The patient is currently seen dr. Maryruth Bun for Outpatient Psychiatric Services. Per her report the patient's medications have been adjusted on this week and the patient began to exhibit manic sx on last Monday, although on today it appears that the patient has taken a double dose of a medication which has exacerbated manic her symptoms. It's been reported the patient has been screaming and is verbally abusive towards family members. She also had hallucinations that she's been bleeding and has experience and inability to sleep throughout the night.   Diagnosis: Bipolar Disporder  Past Medical History:  Past Medical History:  Diagnosis Date  . Bipolar 1 disorder (HCC) 1995   Dr. Caryn Section [phone])  .  Ex-smoker 2001   minimal  . Osteoporosis 2015   fosamax reaction, then was on boniva IV for ~1 yr  . Postmenopausal    age 61  . Vitamin D deficiency     Past Surgical History:  Procedure Laterality Date  . CESAREAN SECTION    . DEXA  09/2013   T -2.8 femur  . TUBAL LIGATION     with reversal    Family History:  Family History  Problem Relation Age of Onset  . Thyroid disease Other        runs in family  . Diabetes Brother   . Hypertension Brother   . Stroke Neg Hx   . CAD Neg Hx   . Cancer Neg Hx     Social History:  reports that she quit smoking about 17 years ago. She has never used smokeless tobacco. She reports that she does not drink alcohol or use drugs.  Additional Social History:  Alcohol / Drug Use Pain Medications: SEE MAR Prescriptions: SEE MAR Over the Counter: SEE MAR History of alcohol / drug use?: No history of alcohol / drug abuse  CIWA: CIWA-Ar BP: (!) 155/90 Pulse Rate: (!) 114 COWS:    Allergies:  Allergies  Allergen Reactions  . Amoxicillin Hives  . Fosamax [Alendronate Sodium] Other (See Comments)    GI distress    Home Medications:  (Not in a hospital admission)  OB/GYN Status:  No LMP recorded. Patient is postmenopausal.  General Assessment Data Location of Assessment: [facility] ED TTS Assessment: In system Is this a Tele or Face-to-Face Assessment?: Face-to-Face Is this an Initial Assessment  or a Re-assessment for this encounter?: Initial Assessment Marital status: Single Living Arrangements: Alone Can pt return to current living arrangement?: Yes Admission Status: Involuntary Is patient capable of signing voluntary admission?: No Referral Source: Self/Family/Friend Insurance type: Medicare  Medical Screening Exam [facility] Walk-[facility] ONLY) Medical Exam completed: Yes  Crisis Care Plan Living Arrangements: Alone Legal Guardian: Other: (none) Name of Psychiatrist: Dr. Maryruth Bun Name of Therapist: none  Education Status Is patient  currently in school?: No Current Grade: n/a Highest grade of school patient has completed: UTA Name of school: n/a Contact person: n/a  Risk to self with the past 6 months Suicidal Ideation:  (UTA) Has patient been a risk to self within the past 6 months prior to admission? : Other (comment) (UTA) Suicidal Intent:  (UTA) Has patient had any suicidal intent within the past 6 months prior to admission? : Other (comment) (UTA) Is patient at risk for suicide?:  (UTA) Suicidal Plan?:  (UTA) Has patient had any suicidal plan within the past 6 months prior to admission? : Other (comment) (UTA) Access to Means:  (UTA) What has been your use of drugs/alcohol within the last 12 months?: none Previous Attempts/Gestures: No How many times?: 0 Other Self Harm Risks: 0 Triggers for Past Attempts: Other (Comment) (n/a) Family Suicide History: No Recent stressful life event(s): Other (Comment) (UTA) Persecutory voices/beliefs?:  (UTA) Depression:  (v) Depression Symptoms:  (UTA) Substance abuse history and/or treatment for substance abuse?:  (UTA) Suicide prevention information given to non-admitted patients: Not applicable  Risk to Others within the past 6 months Homicidal Ideation: No Does patient have any lifetime risk of violence toward others beyond the six months prior to admission? : No Thoughts of Harm to Others: No Current Homicidal Intent: No Current Homicidal Plan: No Access to Homicidal Means: No Identified Victim: n/a History of harm to others?: No Assessment of Violence: None Noted Violent Behavior Description: n/a Does patient have access to weapons?: No Criminal Charges Pending?: No Does patient have a court date: No Is patient on probation?: No  Psychosis Hallucinations:  (UTA) Delusions: None noted  Mental Status Report Appearance/Hygiene: In scrubs Eye Contact: Poor Motor Activity: Freedom of movement Speech: Word salad, Tangential Level of Consciousness:  Alert Mood: Anxious, Labile Affect: Anxious, Labile Anxiety Level: Moderate Thought Processes: Irrelevant, Tangential, Flight of Ideas Judgement: Impaired Orientation: Time, Person Obsessive Compulsive Thoughts/Behaviors: None  Cognitive Functioning Concentration: Poor Memory: Unable to Assess IQ: Average Insight: Unable to Assess Impulse Control: Unable to Assess Appetite: Poor Weight Loss: 0 Weight Gain: 0 Sleep: Increased Total Hours of Sleep: 5 Vegetative Symptoms: None  ADLScreening [facility] Assessment Services) Patient's cognitive ability adequate to safely complete daily activities?: No Patient able to express need for assistance with ADLs?: No Independently performs ADLs?: No  Prior Inpatient Therapy Prior Inpatient Therapy: Yes Prior Therapy Dates: 1610,9604 Prior Therapy Facilty/Provider(s): unknown Reason for Treatment: Bipolar Disorder  Prior Outpatient Therapy Prior Outpatient Therapy: Yes Prior Therapy Dates: Current Prior Therapy Facilty/Provider(s): Dr. Maryruth Bun Reason for Treatment: Bipolar Disorder Does patient have an ACCT team?: No Does patient have Intensive In-House Services?  : No Does patient have [facility] services? : No Does patient have P4CC services?: No  ADL Screening (condition at time of admission) Patient's cognitive ability adequate to safely complete daily activities?: No Patient able to express need for assistance with ADLs?: No Independently performs ADLs?: No       Abuse/Neglect Assessment (Assessment to be complete while patient is alone) Physical Abuse:  (UTA) Verbal  Abuse:  (UTA) Sexual Abuse:  (UTA) Exploitation of patient/patient's resources:  (UTA) Self-Neglect:  (UTA) Values / Beliefs Cultural Requests During Hospitalization: Other (comment) (UTA) Spiritual Requests During Hospitalization: Other (comment) (UTA)        Additional Information 1:1 In Past 12 Months?: No CIRT Risk: No Elopement Risk: No Does patient  have medical clearance?: Yes     Disposition:  Disposition Initial Assessment Completed for this Encounter: Yes Disposition of Patient: Inpatient treatment program  On Site Evaluation by:   Reviewed with Physician:    Asa SaunasShawanna N Jandi Swiger 08/15/2016 6:14 PM

## 2016-08-15 NOTE — ED Notes (Signed)
Pt pulled iv out md aware.  Soc in room with pt now.

## 2016-08-15 NOTE — ED Notes (Signed)
BEHAVIORAL HEALTH ROUNDING  Patient sleeping: No.  Patient alert and oriented: yes to her baseline of person and place Behavior appropriate: No. ; If no, describe: pt taking off her clothes and attempting to walk in hallway naked. Pt redirected.  Nutrition and fluids offered: Yes  Toileting and hygiene offered: Yes  Sitter present: not applicable, Q 15 min safety rounds and observation.  Law enforcement present: Yes ODS

## 2016-08-15 NOTE — ED Notes (Signed)
Iv started and iv fluids infusing.  Pt calm and cooperative at this time.

## 2016-08-16 ENCOUNTER — Emergency Department: Payer: Medicare Other

## 2016-08-16 DIAGNOSIS — J9811 Atelectasis: Secondary | ICD-10-CM | POA: Diagnosis not present

## 2016-08-16 DIAGNOSIS — F302 Manic episode, severe with psychotic symptoms: Secondary | ICD-10-CM | POA: Diagnosis not present

## 2016-08-16 IMAGING — DX DG CHEST 1V PORT
1 series · 1 of 1 positions shown · non-contrast
Comparison: None.

CLINICAL DATA: Altered mental status.

EXAM:
PORTABLE CHEST 1 VIEW

[chest ap]
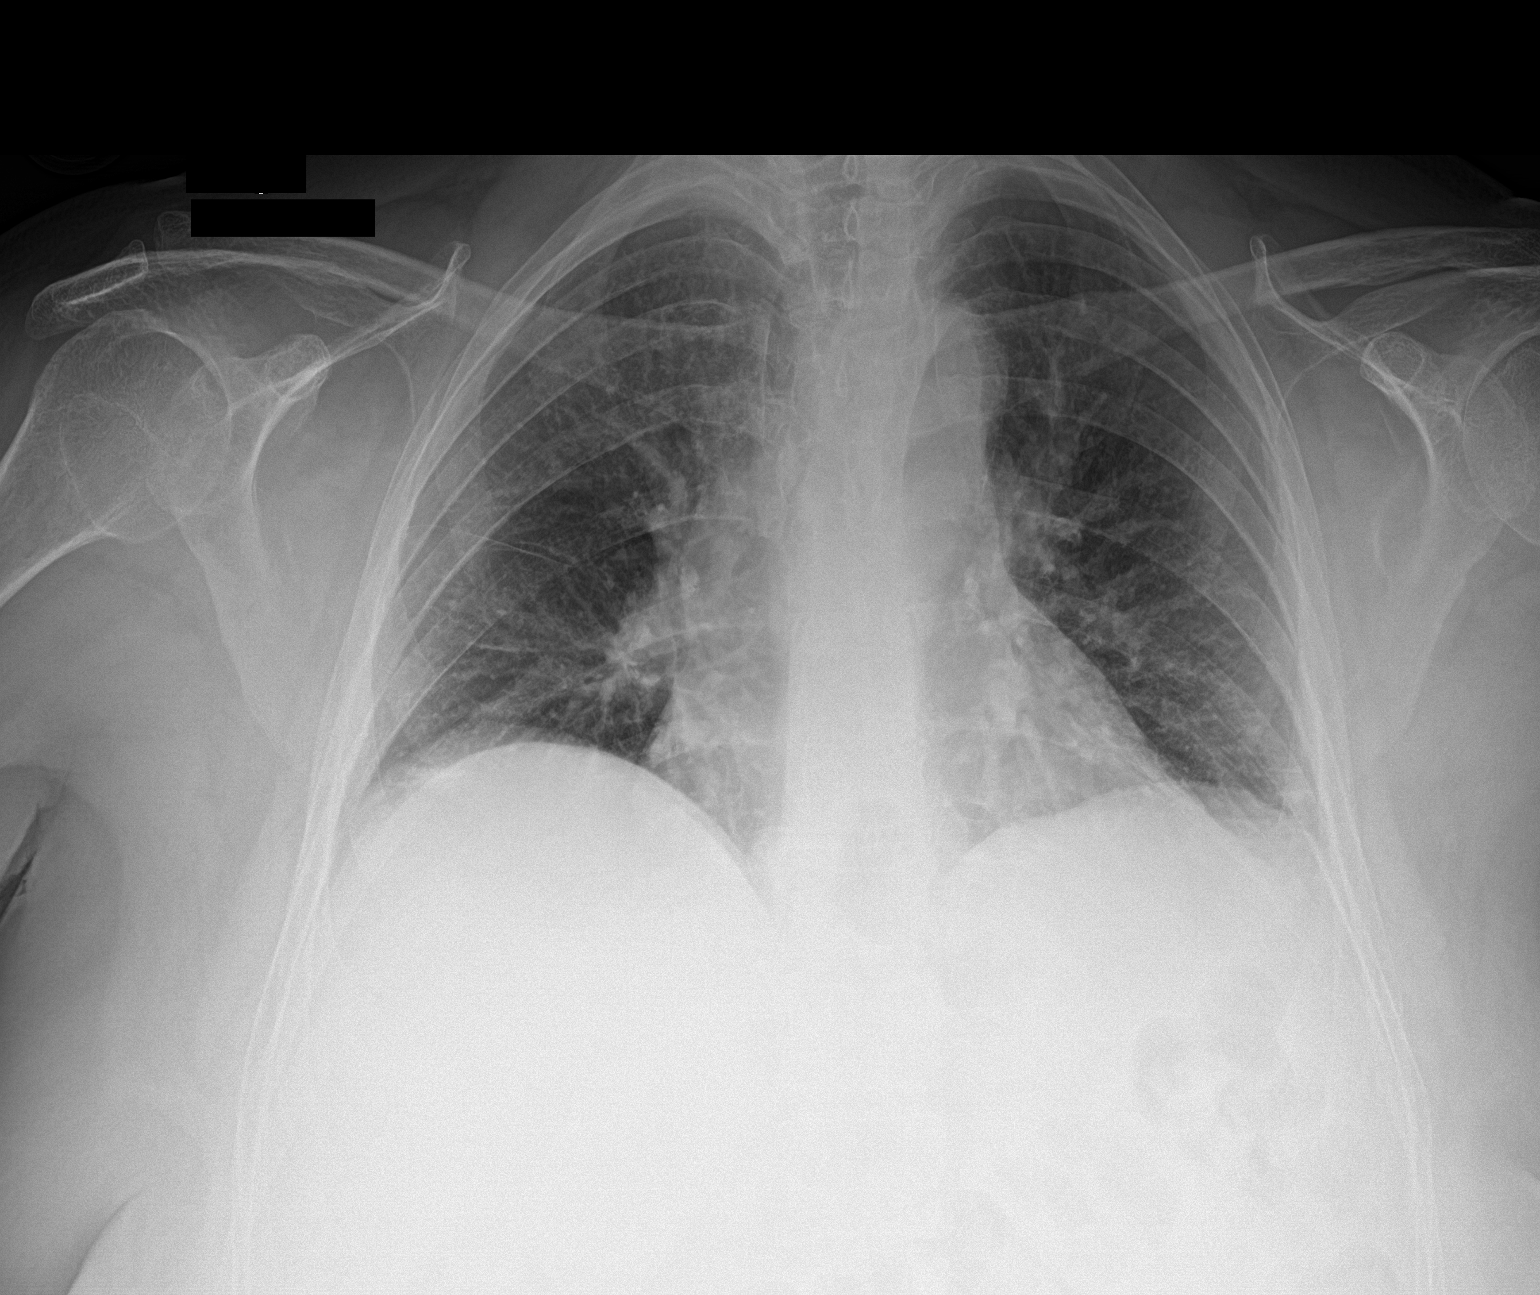

[1 of 1 positions shown; findings below may reference images not displayed]

FINDINGS: Low lung volume limit assessment. Normal heart size and mediastinal
contours for technique. No pulmonary edema. Streaky bibasilar
atelectasis, no confluent consolidation. No pleural fluid or
pneumothorax. No acute osseous abnormality.
IMPRESSION: Low lung volumes with bibasilar atelectasis.

## 2016-08-16 NOTE — ED Notes (Signed)
Clean sheets were placed on pt bed. Pt took sheets off and wet them in the sink then preceded to place sheets in floor.

## 2016-08-16 NOTE — ED Notes (Signed)
BEHAVIORAL HEALTH ROUNDING  Patient sleeping: No.  Patient alert and oriented: yes  Behavior appropriate: no. ; If no, describe: continues to remove clothes, frequently and easily redirected.  Nutrition and fluids offered: Yes  Toileting and hygiene offered: Yes  Sitter present: not applicable, Q 15 min safety rounds and observation.  Law enforcement present: Yes ODS 

## 2016-08-16 NOTE — ED Notes (Signed)
Patient is IVC and has been accepted [facility].

## 2016-08-16 NOTE — ED Provider Notes (Addendum)
-----------------------------------------   7:05 AM on 08/16/2016 -----------------------------------------   Blood pressure (!) 141/69, pulse 88, temperature 97.4 F (36.3 C), temperature source Oral, resp. rate 18, weight 73 kg (161 lb), SpO2 99 %.  The patient had no acute events since last update.  Calm and cooperative at this time.  Disposition is pending bed.    Merrily Brittleifenbark, Aashika Carta, MD 08/16/16 [id number]   I was called that the patient has a bed at [facility] admitting physician is Dr. Joseph ArtSubedi.   Merrily Brittleifenbark, Eleftheria Taborn, MD 08/16/16 1259

## 2016-08-16 NOTE — ED Notes (Signed)
Pt continues to rip clothing provided by ED staff. Pt walking in hallway, re-directed back to room.

## 2016-08-16 NOTE — BH Assessment (Signed)
Patient has been accepted to [facility].  Patient assigned to Geriatric Accepting physician is Dr. Beverly SessionsJagannath Subedi.  Call report to [phone].  Representative was [employer]Melissa.  ER Staff is aware of it Christen Bame(Ronnie, ER Sect.; Dr. Lamont Snowballifenbark, ER MD & Dorene SorrowJerry, Patient's Nurse)    Patient's Family/Support System [place](Martha Fogleman-[phone]) have been updated as well.   Patient Bed will be available anytime after 7pm.

## 2016-08-16 NOTE — ED Notes (Signed)
Pt in rm with carrots, mayonnaise, ketchup and mustard packs in sink washing them off. When asked what she was doing, pt stated "I am making jewelry." This tech went in rm with trash can and cleaned out sink and threw everything away. Pt walking around rm.

## 2016-08-16 NOTE — ED Notes (Signed)
Pt offered shower but declined. Pt advised to let this tech know when she would like to shower otherwise I will offer again later.

## 2016-08-16 NOTE — Progress Notes (Signed)
Rachel Macias at [facility] called and can accept pt once she receives chest x ray and copy of IVC paperwork.  Ann, RN for quad, to get chest x ray ordered. Rachel Macias, MSW, LCSW Clinical Social Worker 08/16/2016 6:19 AM

## 2016-08-16 NOTE — ED Notes (Signed)
BEHAVIORAL HEALTH ROUNDING Patient sleeping: Yes.   Patient alert and oriented: not applicable SLEEPING Behavior appropriate: Yes.  ; If no, describe: SLEEPING Nutrition and fluids offered: No SLEEPING Toileting and hygiene offered: NoSLEEPING Sitter present: not applicable, Q 15 min safety rounds and observation. Law enforcement present: Yes ODS 

## 2016-08-16 NOTE — ED Notes (Signed)
Pt tore shirt again, given new shirt to put on.

## 2016-08-16 NOTE — ED Notes (Signed)
Patient assigned to appropriate care area. Patient oriented to unit/care area: Informed that, for their safety, care areas are designed for safety and monitored by security cameras at all times; and visiting hours explained to patient. Patient verbalizes understanding, and verbal contract for safety obtained. Pt has dementia.

## 2016-08-16 NOTE — ED Notes (Signed)
Pt has torn shirt again, offered shower and pt still declining. Pt walking around rm talking to self.

## 2016-08-16 NOTE — ED Notes (Signed)
PT IVC/ PENDING PLACEMENT  

## 2016-08-16 NOTE — ED Notes (Signed)
BEHAVIORAL HEALTH ROUNDING  Patient sleeping: No.  Patient alert and oriented: yes  Behavior appropriate: No; If no, describe: continues to take off her clothes and has to be redirected frequently.  Nutrition and fluids offered: Yes  Toileting and hygiene offered: Yes  Sitter present: not applicable, Q 15 min safety rounds and observation.  Law enforcement present: Yes ODS

## 2016-08-16 NOTE — ED Notes (Signed)
BEHAVIORAL HEALTH ROUNDING  Patient sleeping: No.  Patient alert and oriented: yes  Behavior appropriate: No ; If no, describe: Pt continues to take off her clothes, frequently requires redirection.  Nutrition and fluids offered: Yes  Toileting and hygiene offered: Yes  Sitter present: not applicable, Q 15 min safety rounds and observation.  Law enforcement present: Yes ODS 

## 2016-08-16 NOTE — ED Notes (Addendum)
ED BHU PLACEMENT JUSTIFICATION Is the patient under IVC or is there intent for IVC: Yes.   Is the patient medically cleared: Yes.   Is there vacancy in the ED BHU: Yes.   Is the population mix appropriate for patient: Yes.   Is the patient awaiting placement in inpatient or outpatient setting: Yes.   Has the patient had a psychiatric consult: Yes.   Survey of unit performed for contraband, proper placement and condition of furniture, tampering with fixtures in bathroom, shower, and each patient room: Yes.  ; Findings: Environment secure with a sitter at bedside.  APPEARANCE/BEHAVIOR calm, cooperative and adequate rapport can be established NEURO ASSESSMENT Orientation: person Hallucinations: No.None noted (Hallucinations) Speech: Normal Gait: normal RESPIRATORY ASSESSMENT Normal expansion.  Clear to auscultation.  No rales, rhonchi, or wheezing., No chest wall tenderness. CARDIOVASCULAR ASSESSMENT regular rate and rhythm, S1, S2 normal, no murmur, click, rub or gallop GASTROINTESTINAL ASSESSMENT soft, nontender, BS WNL, no r/g EXTREMITIES normal strength, tone, and muscle mass, no deformities, no erythema, induration, or nodules, no evidence of joint effusion, ROM of all joints is normal PLAN OF CARE Provide calm/safe environment. Vital signs assessed twice daily. ED BHU Assessment once each 12-hour shift. Collaborate with intake RN daily or as condition indicates. Assure the ED provider has rounded once each shift. Provide and encourage hygiene. Provide redirection as needed. Assess for escalating behavior; address immediately and inform ED provider.  Assess family dynamic and appropriateness for visitation as needed: Yes.  ; If necessary, describe findings: According to previous nurse family is involved in the Pt's care.  Educate the patient/family about BHU procedures/visitation: Yes.  ; If necessary, describe findings: Pt and family understand the rules.

## 2016-08-16 NOTE — BH Assessment (Signed)
Requested information (Chest X-Ray, IVC and Mediation List)  faxed to [facility] and confirmed it was received.

## 2016-08-16 NOTE — ED Notes (Signed)
BEHAVIORAL HEALTH ROUNDING  Patient sleeping: No.  Patient alert and oriented: yes  Behavior appropriate: No ; If no, describe: Pt continues to take off her clothes, frequently requires redirection.  Nutrition and fluids offered: Yes  Toileting and hygiene offered: Yes  Sitter present: not applicable, Q 15 min safety rounds and observation.  Law enforcement present: Yes ODS

## 2016-08-16 NOTE — ED Notes (Addendum)
Pt family member called, gave password. Asked about [facility] and escorting over there. Informed family member that protocol is for deputy escort.

## 2016-08-16 NOTE — ED Notes (Signed)
Pt ambulatory around room. Pt taking her shirt and ripping it apart to make it a different shape on her. Pt sitting on bed, with 1 sock on.

## 2016-08-16 NOTE — ED Notes (Signed)
Pt shirt is torn and pt agrees to change shirts. Clean shirt given to pt.

## 2016-08-16 NOTE — ED Notes (Signed)
Patient removed the inside stuffing from the pillow in her room.  Writer removed remaining pillow and stuffing.

## 2016-08-16 NOTE — ED Notes (Signed)
Pt given meal tray and juice. 

## 2016-08-16 NOTE — ED Notes (Signed)
Pt given lunch tray and juice. 

## 2016-08-16 NOTE — ED Notes (Signed)
BEHAVIORAL HEALTH ROUNDING  Patient sleeping: No.  Patient alert and oriented: yes  Behavior appropriate: no. ; If no, describe: continues to remove clothes, frequently and easily redirected.  Nutrition and fluids offered: Yes  Toileting and hygiene offered: Yes  Sitter present: not applicable, Q 15 min safety rounds and observation.  Law enforcement present: Yes ODS

## 2016-08-16 NOTE — ED Notes (Signed)
Pt has been given another shirt to put on. Pt laid shirt on bed and wrapped self with blanket.

## 2016-08-16 NOTE — ED Notes (Signed)
Patient is IVC and has been accepted [facility]  Patient will leave in the morning.

## 2016-08-16 NOTE — ED Notes (Signed)
Pt given breakfast tray

## 2016-08-17 DIAGNOSIS — M810 Age-related osteoporosis without current pathological fracture: Secondary | ICD-10-CM | POA: Diagnosis present

## 2016-08-17 DIAGNOSIS — D649 Anemia, unspecified: Secondary | ICD-10-CM | POA: Diagnosis present

## 2016-08-17 DIAGNOSIS — Z79899 Other long term (current) drug therapy: Secondary | ICD-10-CM | POA: Diagnosis not present

## 2016-08-17 DIAGNOSIS — Z881 Allergy status to other antibiotic agents status: Secondary | ICD-10-CM | POA: Diagnosis not present

## 2016-08-17 DIAGNOSIS — E860 Dehydration: Secondary | ICD-10-CM | POA: Diagnosis present

## 2016-08-17 DIAGNOSIS — N179 Acute kidney failure, unspecified: Secondary | ICD-10-CM | POA: Diagnosis present

## 2016-08-17 DIAGNOSIS — Z888 Allergy status to other drugs, medicaments and biological substances status: Secondary | ICD-10-CM | POA: Diagnosis not present

## 2016-08-17 DIAGNOSIS — F312 Bipolar disorder, current episode manic severe with psychotic features: Secondary | ICD-10-CM | POA: Diagnosis not present

## 2016-08-17 DIAGNOSIS — F302 Manic episode, severe with psychotic symptoms: Secondary | ICD-10-CM | POA: Diagnosis not present

## 2016-08-17 DIAGNOSIS — F319 Bipolar disorder, unspecified: Secondary | ICD-10-CM | POA: Insufficient documentation

## 2016-08-17 DIAGNOSIS — N189 Chronic kidney disease, unspecified: Secondary | ICD-10-CM | POA: Diagnosis present

## 2016-08-17 DIAGNOSIS — Z87891 Personal history of nicotine dependence: Secondary | ICD-10-CM | POA: Diagnosis not present

## 2016-08-17 DIAGNOSIS — E559 Vitamin D deficiency, unspecified: Secondary | ICD-10-CM | POA: Diagnosis present

## 2016-08-17 DIAGNOSIS — E785 Hyperlipidemia, unspecified: Secondary | ICD-10-CM | POA: Diagnosis present

## 2016-08-17 NOTE — ED Provider Notes (Signed)
The patient's exam remains unchanged and he remained stable for transfer.   Merrily Brittleifenbark, Steffany Schoenfelder, MD 08/17/16 1347

## 2016-08-17 NOTE — ED Notes (Signed)
Pt urinated on a chair in the dayroom.

## 2016-08-17 NOTE — ED Notes (Signed)
Report called to Engineer, waterMelissa RN at [facility].

## 2016-08-17 NOTE — ED Provider Notes (Signed)
-----------------------------------------   7:53 AM on 08/17/2016 -----------------------------------------   Blood pressure 122/64, pulse (!) 102, temperature 99.2 F (37.3 C), temperature source Oral, resp. rate 18, weight 73 kg (161 lb), SpO2 95 %.  The patient had no acute events since last update.  Calm and cooperative at this time.  The [facility] to [facility].     Rebecka ApleyWebster, Allison P, MD 08/17/16 [phone]

## 2016-08-17 NOTE — Consult Note (Signed)
[facility] Face-to-Face Psychiatry Consult   Reason for Consult:  Consult for 71 year old woman follow-up who presented to the emergency room over the weekend with worsening psychosis Referring Physician:  Cyril Loosen Patient Identification: Rachel Macias MRN:  [id number] Principal Diagnosis: Bipolar affective disorder, manic, severe, with psychotic behavior (HCC) Diagnosis:   Patient Active Problem List   Diagnosis Date Noted  . Bipolar affective disorder, manic, severe, with psychotic behavior (HCC) [F31.2] 08/17/2016  . Acute cystitis without hematuria [N30.00] 05/29/2016  . Acute bilateral low back pain without sciatica [M54.5] 05/29/2016  . Tremor [R25.1] 05/29/2016  . Routine physical examination [Z00.00] 12/12/2015  . Dyslipidemia [E78.5] 08/27/2014  . Vitamin D deficiency [E55.9]   . Thyroid function test abnormal [R94.6] 05/16/2013  . Osteoporosis [M81.0] 05/16/2013  . Bipolar disorder, in full remission, most recent episode manic (HCC) [F31.74]     Total Time spent with patient: 30 minutes  Subjective:   Rachel Macias is a 71 y.o. female patient admitted with patient not able to give any subjective complaint with any specific nature.  HPI:  Follow-up note for 71 year old woman brought to the emergency room earlier in the weekend. Family reported her psychosis and agitation has been getting worse. Apparently she had also taken higher than usual amount of her lithium recently and her lithium level was elevated. On interview today the patient is grossly disorganized. Not able to make any sense at all. No specific complaint. Rambling speech disorganized behavior. Very disheveled.'s been given olanzapine since coming into the emergency room but has not been restarted on lithium or any other medicine.  Patient is a regular outpatient of Dr. Shelda Altes. I have tried to reach Dr. Maryruth Bun today but I have not yet heard back from her.  Substance abuse history: None noted in the past  Medical history:  Appears to be in fairly good medical health outside of mental health issues  Past Psychiatric History: Lung standing bipolar disorder with history of psychotic manic episodes in the past. Mostly has been stable in past years. Recently he appears to be decompensating  Risk to Self: Suicidal Ideation:  (UTA) Suicidal Intent:  (UTA) Is patient at risk for suicide?:  (UTA) Suicidal Plan?:  (UTA) Access to Means:  (UTA) What has been your use of drugs/alcohol within the last 12 months?: none How many times?: 0 Other Self Harm Risks: 0 Triggers for Past Attempts: Other (Comment) (n/a) Risk to Others: Homicidal Ideation: No Thoughts of Harm to Others: No Current Homicidal Intent: No Current Homicidal Plan: No Access to Homicidal Means: No Identified Victim: n/a History of harm to others?: No Assessment of Violence: None Noted Violent Behavior Description: n/a Does patient have access to weapons?: No Criminal Charges Pending?: No Does patient have a court date: No Prior Inpatient Therapy: Prior Inpatient Therapy: Yes Prior Therapy Dates: 5621,3086 Prior Therapy Facilty/Provider(s): unknown Reason for Treatment: Bipolar Disorder Prior Outpatient Therapy: Prior Outpatient Therapy: Yes Prior Therapy Dates: Current Prior Therapy Facilty/Provider(s): Dr. Maryruth Bun Reason for Treatment: Bipolar Disorder Does patient have an ACCT team?: No Does patient have Intensive In-House Services?  : No Does patient have [facility] services? : No Does patient have P4CC services?: No  Past Medical History:  Past Medical History:  Diagnosis Date  . Bipolar 1 disorder (HCC) 1995   Dr. Caryn Section [phone])  . Ex-smoker 2001   minimal  . Osteoporosis 2015   fosamax reaction, then was on boniva IV for ~1 yr  . Postmenopausal    age 16  . Vitamin  D deficiency     Past Surgical History:  Procedure Laterality Date  . CESAREAN SECTION    . DEXA  09/2013   T -2.8 femur  . TUBAL LIGATION     with  reversal   Family History:  Family History  Problem Relation Age of Onset  . Thyroid disease Other        runs in family  . Diabetes Brother   . Hypertension Brother   . Stroke Neg Hx   . CAD Neg Hx   . Cancer Neg Hx    Family Psychiatric  History: Unknown Social History:  History  Alcohol Use No     History  Drug Use No    Social History   Social History  . Marital status: Widowed    Spouse name: N/A  . Number of children: N/A  . Years of education: N/A   Social History Main Topics  . Smoking status: Former Smoker    Quit date: 03/10/1999  . Smokeless tobacco: Never Used  . Alcohol use No  . Drug use: No  . Sexual activity: Not Asked   Other Topics Concern  . None   Social History Narrative   Lives alone with 2 dogs, widow   30 yrs ago son died at age 43yo (hit by car).   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 Amoxicillin Hives  . Fosamax [Alendronate Sodium] Other (See Comments)    GI distress    Labs:  Results for orders placed or performed during the hospital encounter of 08/15/16 (from the past 48 hour(s))  Urinalysis, Complete w Microscopic     Status: Abnormal   Collection Time: 08/15/16  3:28 PM  Result Value Ref Range   Color, Urine COLORLESS (A) YELLOW   APPearance CLEAR (A) CLEAR   Specific Gravity, Urine 1.004 (L) 1.005 - 1.030   pH 7.0 5.0 - 8.0   Glucose, UA NEGATIVE NEGATIVE mg/dL   Hgb urine dipstick SMALL (A) NEGATIVE   Bilirubin Urine NEGATIVE NEGATIVE   Ketones, ur NEGATIVE NEGATIVE mg/dL   Protein, ur NEGATIVE NEGATIVE mg/dL   Nitrite NEGATIVE NEGATIVE   Leukocytes, UA NEGATIVE NEGATIVE   RBC / HPF 0-5 0 - 5 RBC/hpf   WBC, UA 0-5 0 - 5 WBC/hpf   Bacteria, UA NONE SEEN NONE SEEN    Squamous Epithelial / LPF 0-5 (A) NONE SEEN   Mucous PRESENT   Lithium level     Status: Abnormal   Collection Time: 08/15/16  8:05 PM  Result Value Ref Range   Lithium Lvl 1.40 (H) 0.60 - 1.20 mmol/L    Current Facility-Administered Medications  Medication Dose Route Frequency Provider Last Rate Last Dose  . OLANZapine (ZYPREXA) injection 5 mg  5 mg Intramuscular Q6H PRN Phineas Semen, MD      . OLANZapine [facility]) tablet 5 mg  5 mg Oral Clelia Croft, MD   5 mg at 08/17/16 5621   Current Outpatient Prescriptions  Medication Sig Dispense Refill  . divalproex (DEPAKOTE ER) 250 MG 24 hr tablet Take 750 mg by mouth at bedtime.   0  . OLANZapine (ZYPREXA) 10 MG tablet Take 10 mg by mouth at bedtime.    Marland Kitchen  QUEtiapine (SEROQUEL) 200 MG tablet Take 225 mg by mouth at bedtime.   0  . simvastatin (ZOCOR) 20 MG tablet Take 1 tablet (20 mg total) by mouth every evening. 90 tablet 3  . cholecalciferol (VITAMIN D) 1000 UNITS tablet Take 2 tablets (2,000 Units total) by mouth daily.      Musculoskeletal: Strength & Muscle Tone: within normal limits Gait & Station: normal Patient leans: N/A  Psychiatric Specialty Exam: Physical Exam  Nursing note and vitals reviewed. Constitutional: She appears well-developed and well-nourished.  HENT:  Head: Normocephalic and atraumatic.  Eyes: Conjunctivae are normal. Pupils are equal, round, and reactive to light.  Neck: Normal range of motion.  Cardiovascular: Regular rhythm and normal heart sounds.   Respiratory: Effort normal. No respiratory distress.  GI: Soft.  Musculoskeletal: Normal range of motion.  Neurological: She is alert.  Skin: Skin is warm and dry.  Psychiatric: Her affect is inappropriate. Her speech is tangential. She is agitated. Thought content is delusional. Cognition and memory are impaired. She expresses impulsivity and inappropriate judgment. She expresses no homicidal and no suicidal ideation. She is  noncommunicative. She exhibits abnormal recent memory. She is inattentive.    Review of Systems  Unable to perform ROS: Psychiatric disorder    Blood pressure 122/64, pulse (!) 102, temperature 99.2 F (37.3 C), temperature source Oral, resp. rate 18, weight 73 kg (161 lb), SpO2 95 %.Body mass index is 26.79 kg/m.  General Appearance: Disheveled  Eye Contact:  Fair  Speech:  Garbled  Volume:  Decreased  Mood:  Dysphoric and Irritable  Affect:  Inappropriate and Labile  Thought Process:  Disorganized  Orientation:  Negative  Thought Content:  Illogical and Tangential  Suicidal Thoughts:  No  Homicidal Thoughts:  No  Memory:  Negative  Judgement:  Negative  Insight:  Negative  Psychomotor Activity:  Normal  Concentration:  Concentration: Poor  Recall:  Poor  Fund of Knowledge:  Fair  Language:  Poor  Akathisia:  No  Handed:  Right  AIMS (if indicated):     Assets:  Health and safety inspectorinancial Resources/Insurance Housing Physical Health  ADL's:  Impaired  Cognition:  Impaired,  Mild  Sleep:        Treatment Plan Summary: Daily contact with patient to assess and evaluate symptoms and progress in treatment, Medication management and Plan 71 year old woman with bipolar disorder has been managed in the emergency room for the last couple of days while awaiting referral to geriatric psychiatry unit. On evaluation today she remains extremely disorganized in her thinking unable to give any kind of coherent history. She has been taking medicine but does not seem to be much better. Still clearly needs hospital level care. I left a message for Dr. Maryruth BunKapur to communicate with her but at this point it sounds like transportation is ready. [facility] and will be transferred today.  Disposition: Recommend psychiatric Inpatient admission when medically cleared.  Mordecai RasmussenJohn Damonta Cossey, MD 08/17/2016 1:57 PM

## 2016-08-18 ENCOUNTER — Telehealth: Payer: Self-pay | Admitting: Family

## 2016-08-18 NOTE — Telephone Encounter (Signed)
Following up on Discharge from [facility] patient was admitted on [facility]. Will continue to follow for d/c and call for TCM if /or when discharged. FYI

## 2016-08-24 ENCOUNTER — Telehealth: Payer: Self-pay | Admitting: Family

## 2016-08-24 DIAGNOSIS — N179 Acute kidney failure, unspecified: Secondary | ICD-10-CM

## 2016-08-24 NOTE — Telephone Encounter (Signed)
Call pt  reviwed labs from 6/4/ from dr Maryruth Bunkapur  We are both concerned regarding kidney function  Please ensure she makes follow up appt with me when out of ward

## 2016-08-24 NOTE — Telephone Encounter (Signed)
Left message for patient to return call back.  

## 2016-08-25 NOTE — Telephone Encounter (Signed)
Patient stated not to worry about her kidney function.  Patient said that it was due to the pain medication she was taking for her back.  Patient does not wish to make a follow up appointment due to it being so close since the last visit. FYI

## 2016-08-26 DIAGNOSIS — N179 Acute kidney failure, unspecified: Secondary | ICD-10-CM | POA: Insufficient documentation

## 2016-08-26 NOTE — Telephone Encounter (Signed)
noted 

## 2016-08-27 DIAGNOSIS — F319 Bipolar disorder, unspecified: Secondary | ICD-10-CM | POA: Diagnosis not present

## 2016-10-01 DIAGNOSIS — F319 Bipolar disorder, unspecified: Secondary | ICD-10-CM | POA: Diagnosis not present

## 2016-12-10 DIAGNOSIS — F319 Bipolar disorder, unspecified: Secondary | ICD-10-CM | POA: Diagnosis not present

## 2017-01-04 ENCOUNTER — Ambulatory Visit (INDEPENDENT_AMBULATORY_CARE_PROVIDER_SITE_OTHER): Payer: Medicare Other

## 2017-01-04 ENCOUNTER — Ambulatory Visit (INDEPENDENT_AMBULATORY_CARE_PROVIDER_SITE_OTHER): Payer: Medicare Other | Admitting: Family

## 2017-01-04 VITALS — BP 130/80 | HR 84 | Temp 98.2°F | Resp 14 | Ht 64.0 in | Wt 159.0 lb

## 2017-01-04 DIAGNOSIS — E785 Hyperlipidemia, unspecified: Secondary | ICD-10-CM

## 2017-01-04 DIAGNOSIS — F312 Bipolar disorder, current episode manic severe with psychotic features: Secondary | ICD-10-CM

## 2017-01-04 DIAGNOSIS — Z23 Encounter for immunization: Secondary | ICD-10-CM | POA: Diagnosis not present

## 2017-01-04 DIAGNOSIS — F3174 Bipolar disorder, in full remission, most recent episode manic: Secondary | ICD-10-CM

## 2017-01-04 DIAGNOSIS — Z0000 Encounter for general adult medical examination without abnormal findings: Secondary | ICD-10-CM

## 2017-01-04 MED ORDER — SIMVASTATIN 20 MG PO TABS: 20.0000 mg | ORAL_TABLET | Freq: Every evening | ORAL | 3 refills | Status: DC

## 2017-01-04 NOTE — Patient Instructions (Addendum)
Pleasure seeing you today, always  Labs today  Let me know if you need anything.

## 2017-01-04 NOTE — Patient Instructions (Addendum)
  Rachel Macias , Thank you for taking time to come for your Medicare Wellness Visit. I appreciate your ongoing commitment to your health goals. Please review the following plan we discussed and let me know if I can assist you in the future.   These are the goals we discussed: Goals    . Healthy Lifestyle          Low carb foods Stay active and exercise Stay hydrated and drink plenty of fluids        This is a list of the screening recommended for you and due dates:  Health Maintenance  Topic Date Due  . Tetanus Vaccine  08/28/1964  . Colon Cancer Screening  08/29/1995  . Mammogram  01/20/2018  . Flu Shot  Completed  . DEXA scan (bone density measurement)  Completed  .  Hepatitis C: One time screening is recommended by Center for Disease Control  (CDC) for  adults born from [birth year] through 1965.   Completed  . Pneumonia vaccines  Completed

## 2017-01-04 NOTE — Progress Notes (Signed)
Subjective:    Patient ID: Rachel Macias, female    DOB: 1945-09-04, 71 y.o.   MRN: [id number]  CC: Rachel Macias is a 71 y.o. female who presents today for follow up.   HPI: doing well today.Changed from lithium to depakote; No longer seeing Dr Maryruth Bun. Now at [facility]- Dr Leafy Kindle   Since last saw patient, she had been hospitalized during transition since coming off of lithium.   Struggling with relationship with sister who is taking a strong role health/medications and with her POA.   Melene Plan to [place] or [place] for group therapy with like minded people- its a spirtual vacation for her.  No thoughts of hurting herself or anyone else.         HISTORY:  Past Medical History:  Diagnosis Date  . Bipolar 1 disorder (HCC) 1995   Dr. Caryn Section [phone])  . Ex-smoker 2001   minimal  . Osteoporosis 2015   fosamax reaction, then was on boniva IV for ~1 yr  . Postmenopausal    age 22  . Vitamin D deficiency    Past Surgical History:  Procedure Laterality Date  . CESAREAN SECTION    . DEXA  09/2013   T -2.8 femur  . TUBAL LIGATION     with reversal   Family History  Problem Relation Age of Onset  . Thyroid disease Other        runs in family  . Diabetes Brother   . Hypertension Brother   . Stroke Neg Hx   . CAD Neg Hx   . Cancer Neg Hx     Allergies: Amoxicillin and Fosamax [alendronate sodium] Current Outpatient Prescriptions on File Prior to Visit  Medication Sig Dispense Refill  . cholecalciferol (VITAMIN D) 1000 UNITS tablet Take 2 tablets (2,000 Units total) by mouth daily.    . divalproex (DEPAKOTE ER) 250 MG 24 hr tablet Take 750 mg by mouth at bedtime.   0  . OLANZapine (ZYPREXA) 10 MG tablet Take 10 mg by mouth at bedtime.    Marland Kitchen QUEtiapine (SEROQUEL) 200 MG tablet Take 225 mg by mouth at bedtime.   0   No current facility-administered medications on file prior to visit.     Social History  Substance Use Topics  . Smoking status: Former Smoker   Quit date: 03/10/1999  . Smokeless tobacco: Never Used  . Alcohol use No    Review of Systems  Constitutional: Negative for chills and fever.  Respiratory: Negative for cough.   Cardiovascular: Negative for chest pain and palpitations.  Gastrointestinal: Negative for nausea and vomiting.  Psychiatric/Behavioral: Negative for suicidal ideas.      Objective:    There were no vitals taken for this visit. BP Readings from Last 3 Encounters:  01/04/17 130/80  08/17/16 (!) 159/98  05/29/16 130/80   Wt Readings from Last 3 Encounters:  01/04/17 159 lb (72.1 kg)  08/15/16 161 lb (73 kg)  05/29/16 161 lb (73 kg)   Vitals from Rachel Macias at wellness: 130/80, 84, 14, 98.2    Physical Exam  Constitutional: She appears well-developed and well-nourished.  Eyes: Conjunctivae are normal.  Cardiovascular: Normal rate, regular rhythm, normal heart sounds and normal pulses.   Pulmonary/Chest: Effort normal and breath sounds normal. She has no wheezes. She has no rhonchi. She has no rales.  Neurological: She is alert.  Skin: Skin is warm and dry.  Psychiatric: She has a normal mood and affect. Her speech is normal and behavior  is normal. Thought content normal.  Vitals reviewed.      Assessment & Plan:   Problem List Items Addressed This Visit      Other   Bipolar disorder, in full remission, most recent episode manic (HCC)    Pleased to see patient doing well on current regimen.declines referral to formal therapy at this time.  Advised her to [place] which patient [place]. Pending labs today to monitor LFTs, renal function. Will follow.       Dyslipidemia   Relevant Medications   simvastatin (ZOCOR) 20 MG tablet   Bipolar affective disorder, manic, severe, with psychotic behavior (HCC) - Primary   Relevant Orders   Comprehensive metabolic panel    Other Visit Diagnoses    Hyperlipidemia, unspecified hyperlipidemia type       Relevant  Medications   simvastatin (ZOCOR) 20 MG tablet       I am having Rachel Macias maintain her cholecalciferol, divalproex, QUEtiapine, OLANZapine, and simvastatin.   Meds ordered this encounter  Medications  . DISCONTD: simvastatin (ZOCOR) 20 MG tablet    Sig: Take 1 tablet (20 mg total) by mouth every evening.    Dispense:  90 tablet    Refill:  3    Order Specific Question:   Supervising Provider    Answer:   Duncan DullULLO, TERESA L [[id number]]  . simvastatin (ZOCOR) 20 MG tablet    Sig: Take 1 tablet (20 mg total) by mouth every evening.    Dispense:  90 tablet    Refill:  3    Return precautions given.   Risks, benefits, and alternatives of the medications and treatment plan prescribed today were discussed, and patient expressed understanding.   Education regarding symptom management and diagnosis given to patient on AVS.  Continue to follow with Allegra GranaArnett, Lyrica Mcclarty G, FNP for routine health maintenance.   Rachel PetersJane Geving and I agreed with plan.   Rennie PlowmanMargaret Alenah Sarria, FNP

## 2017-01-04 NOTE — Assessment & Plan Note (Addendum)
Pleased to see patient doing well on current regimen.declines referral to formal therapy at this time.  Advised her [place]. Pending labs today to monitor LFTs, renal function. Will follow.

## 2017-01-04 NOTE — Progress Notes (Signed)
Pre visit review using our clinic review tool, if applicable. No additional management support is needed unless otherwise documented below in the visit note. 

## 2017-01-04 NOTE — Progress Notes (Signed)
Subjective:   Stephannie PetersJane Vandervort is a 71 y.o. female who presents for Medicare Annual (Subsequent) preventive examination.  Review of Systems:  No ROS.  Medicare Wellness Visit. Additional risk factors are reflected in the social history.  Cardiac Risk Factors include: advanced age (>7655men, 37>65 women)     Objective:     Vitals: BP 130/80 (BP Location: Left Arm, Patient Position: Sitting, Cuff Size: Normal)   Pulse 84   Temp 98.2 F (36.8 C) (Oral)   Resp 14   Ht 5\' 4"  (1.626 m)   Wt 159 lb (72.1 kg)   SpO2 99%   BMI 27.29 kg/m   Body mass index is 27.29 kg/m.   Tobacco History  Smoking Status  . Former Smoker  . Quit date: 03/10/1999  Smokeless Tobacco  . Never Used     Counseling given: Not Answered   Past Medical History:  Diagnosis Date  . Bipolar 1 disorder (HCC) 1995   Dr. Caryn SectionAarti Kapur [phone])  . Ex-smoker 2001   minimal  . Osteoporosis 2015   fosamax reaction, then was on boniva IV for ~1 yr  . Postmenopausal    age 71  . Vitamin D deficiency    Past Surgical History:  Procedure Laterality Date  . CESAREAN SECTION    . DEXA  09/2013   T -2.8 femur  . TUBAL LIGATION     with reversal   Family History  Problem Relation Age of Onset  . Thyroid disease Other        runs in family  . Diabetes Brother   . Hypertension Brother   . Stroke Neg Hx   . CAD Neg Hx   . Cancer Neg Hx    History  Sexual Activity  . Sexual activity: Not on file    Outpatient Encounter Prescriptions as of 01/04/2017  Medication Sig  . cholecalciferol (VITAMIN D) 1000 UNITS tablet Take 2 tablets (2,000 Units total) by mouth daily.  . divalproex (DEPAKOTE ER) 250 MG 24 hr tablet Take 750 mg by mouth at bedtime.   Marland Kitchen. OLANZapine (ZYPREXA) 10 MG tablet Take 10 mg by mouth at bedtime.  Marland Kitchen. QUEtiapine (SEROQUEL) 200 MG tablet Take 225 mg by mouth at bedtime.   . simvastatin (ZOCOR) 20 MG tablet Take 1 tablet (20 mg total) by mouth every evening.   No facility-administered  encounter medications on file as of 01/04/2017.     Activities of Daily Living In your present state of health, do you have any difficulty performing the following activities: 01/04/2017  Hearing? N  Vision? N  Difficulty concentrating or making decisions? Y  Walking or climbing stairs? N  Dressing or bathing? N  Doing errands, shopping? N  Preparing Food and eating ? N  Using the Toilet? N  In the past six months, have you accidently leaked urine? Y  Comment Managed with a daily pad  Do you have problems with loss of bowel control? N  Managing your Medications? N  Managing your Finances? N  Housekeeping or managing your Housekeeping? N  Some recent data might be hidden    Patient Care Team: Allegra GranaArnett, Margaret G, FNP as PCP - General (Family Medicine)    Assessment:    This is a routine wellness examination for Erskine SquibbJane. The goal of the wellness visit is to assist the patient how to close the gaps in care and create a preventative care plan for the patient.   The roster of all physicians providing medical care  to patient is listed in the Snapshot section of the chart.  Taking calcium VIT D as appropriate/Osteoporosis reviewed.    Safety issues reviewed; Smoke and carbon monoxide detectors in the home. No firearms in the home.  Wears seatbelts when driving or riding with others. Patient does wear sunscreen or protective clothing when in direct sunlight. No violence in the home.  Depression- screening exception; currently in treatment/counseling.  Patient is alert, normal appearance, oriented to person/place/and time.   No new identified risk were noted.  No failures at ADL's or IADL's.    BMI- discussed the importance of a healthy diet, water intake and the benefits of aerobic exercise. Educational material provided.   24 hour diet recall: Breakfast: 2 eggs, 2 toast Lunch: pasta chicken salad Dinner: grilled chicken, green vegetable  Daily fluid intake: 2 cups of  caffeine, 4 cups of water  Dental- every 6 months.  Eye- Visual acuity not assessed per patient preference since they have regular follow up with the ophthalmologist.  Wears corrective lenses when reading.  Sleep patterns- Sleeps 8 hours at night.  Wakes feeling rested.  High dose influenza administered L deltoid, tolerated well. Educational material provided.  TDAP vaccine deferred per patient preference.  Follow up with insurance.  Educational material provided.  Colonoscopy deferred; COLOguard complete 01/06/16. Negative.  Patient Concerns: None at this time. Follow up with PCP as needed.  Exercise Activities and Dietary recommendations Current Exercise Habits: Home exercise routine, Type of exercise: calisthenics, Time (Minutes): 20, Frequency (Times/Week): 2, Weekly Exercise (Minutes/Week): 40, Intensity: Mild  Goals    . Healthy Lifestyle          Low carb foods Stay active and exercise Stay hydrated and drink plenty of fluids       Fall Risk Fall Risk  01/04/2017 05/29/2016 02/12/2016 09/14/2014 08/22/2013  Falls in the past year? No No Yes No No  Number falls in past yr: - - 1 - -  Injury with Fall? - - No - -   Depression Screen PHQ 2/9 Scores 01/04/2017 05/29/2016 02/12/2016 09/14/2014  PHQ - 2 Score - 0 0 0  Exception Documentation Other- indicate reason in comment box - - -  Not completed Currently in treatment with Dr.Braden ([facility]) - - -     Cognitive Function MMSE - Mini Mental State Exam 01/04/2017  Not completed: Refused        Immunization History  Administered Date(s) Administered  . Influenza, High Dose Seasonal PF 12/12/2015, 01/04/2017  . Influenza-Unspecified 01/07/2013  . Pneumococcal Conjugate-13 08/22/2013  . Pneumococcal Polysaccharide-23 09/14/2014   Screening Tests Health Maintenance  Topic Date Due  . TETANUS/TDAP  08/28/1964  . COLONOSCOPY  08/29/1995  . MAMMOGRAM  01/20/2018  . INFLUENZA VACCINE  Completed  . DEXA SCAN  Completed    . Hepatitis C Screening  Completed  . PNA vac Low Risk Adult  Completed      Plan:    End of life planning; Advance aging; Advanced directives discussed. Copy of current HCPOA/Living Will requested.    I have personally reviewed and noted the following in the patient's chart:   . Medical and social history . Use of alcohol, tobacco or illicit drugs  . Current medications and supplements . Functional ability and status . Nutritional status . Physical activity . Advanced directives . List of other physicians . Hospitalizations, surgeries, and ER visits in previous 12 months . Vitals . Screenings to include cognitive, depression, and falls . Referrals and appointments  In addition, I have reviewed and discussed with patient certain preventive protocols, quality metrics, and best practice recommendations. A written personalized care plan for preventive services as well as general preventive health recommendations were provided to patient.     Ashok Pall, LPN  16/12/9602    Agree with plan. Rennie Plowman, NP

## 2017-01-05 LAB — COMPREHENSIVE METABOLIC PANEL
ALBUMIN: 4.4 g/dL (ref 3.5–5.2)
ALT: 17 U/L (ref 0–35)
AST: 26 U/L (ref 0–37)
Alkaline Phosphatase: 57 U/L (ref 39–117)
BILIRUBIN TOTAL: 0.3 mg/dL (ref 0.2–1.2)
BUN: 18 mg/dL (ref 6–23)
CALCIUM: 10.2 mg/dL (ref 8.4–10.5)
CHLORIDE: 105 meq/L (ref 96–112)
CO2: 28 mEq/L (ref 19–32)
CREATININE: 0.87 mg/dL (ref 0.40–1.20)
GFR: 68.15 mL/min (ref >60.00)
Glucose, Bld: 91 mg/dL (ref 70–99)
Potassium: 4.6 mEq/L (ref 3.5–5.1)
Sodium: 141 mEq/L (ref 135–145)
Total Protein: 7.8 g/dL (ref 6.0–8.3)

## 2017-01-14 ENCOUNTER — Other Ambulatory Visit: Payer: Self-pay | Admitting: Family

## 2017-01-14 DIAGNOSIS — Z1231 Encounter for screening mammogram for malignant neoplasm of breast: Secondary | ICD-10-CM

## 2017-02-08 ENCOUNTER — Ambulatory Visit
Admission: RE | Admit: 2017-02-08 | Discharge: 2017-02-08 | Disposition: A | Discharge status: Home / Self Care | Payer: Medicare Other | Source: Ambulatory Visit | Attending: Family | Admitting: Family

## 2017-02-08 DIAGNOSIS — Z1231 Encounter for screening mammogram for malignant neoplasm of breast: Secondary | ICD-10-CM | POA: Diagnosis not present

## 2017-03-11 DIAGNOSIS — F319 Bipolar disorder, unspecified: Secondary | ICD-10-CM | POA: Diagnosis not present

## 2017-04-15 ENCOUNTER — Ambulatory Visit (INDEPENDENT_AMBULATORY_CARE_PROVIDER_SITE_OTHER): Payer: Medicare Other | Admitting: Psychiatry

## 2017-04-15 ENCOUNTER — Encounter: Payer: Self-pay | Admitting: Psychiatry

## 2017-04-15 ENCOUNTER — Other Ambulatory Visit: Payer: Self-pay

## 2017-04-15 VITALS — BP 136/87 | HR 96 | Temp 98.3°F | Wt 152.8 lb

## 2017-04-15 DIAGNOSIS — F3110 Bipolar disorder, current episode manic without psychotic features, unspecified: Secondary | ICD-10-CM | POA: Diagnosis not present

## 2017-04-15 MED ORDER — QUETIAPINE FUMARATE 200 MG PO TABS: 200.0000 mg | ORAL_TABLET | Freq: Every day | ORAL | 0 refills | Status: DC

## 2017-04-15 NOTE — Patient Instructions (Signed)
PLEASE GET YOUR DEPAKOTE LEVEL IN THE MORNING AT [facility].

## 2017-04-15 NOTE — Progress Notes (Signed)
Psychiatric Initial Adult Assessment   Patient Identification: Rachel Macias MRN:  [id number] Date of Evaluation:  04/15/2017 Referral Source: Rennie Plowman FNP  Chief Complaint:  ' I am doing ok."  Chief Complaint    Establish Care; Manic Behavior     Visit Diagnosis:    ICD-10-CM   1. Bipolar I disorder, most recent episode (or current) manic (HCC) F31.10 QUEtiapine (SEROQUEL) 200 MG tablet    History of Present Illness: Rachel Macias is a 72 year old Caucasian female, widowed, lives in [place], has a history of bipolar disorder, presented to the clinic today to establish care.  [name] reports she has a history of bipolar disorder.  She has had that since her early 61s.  She reports most of the time her episodes were triggered by stress.  She reports her symptoms are mostly racing thoughts as well as sleep issues.  She reports she has had several manic episodes.  She does not remember having any significant depressive episodes in the past.  She was most recently admitted at [facility] psychiatric unit after she had an episode of confusion due to possible lithium toxicity.  She reports she left the facility and ever since she got discharged from there she has been doing well.  She was on the lithium for several years.  She reports the lithium kept her stable but it also started affecting her kidneys.  After her admission at [facility] she was started on Depakote and Seroquel.  She reports this combination of medication is actually helpful.  She describes her mood symptoms as stable.  She reports she is sleeping okay.  She denies any perceptual disturbances.  She denies any suicidality or homicidality.  She denies any history of anxiety attacks.  She reports she is a bit anxious today since this is her first visit here.  She reports she used to go to [facility] as well as Dr. Edwin Dada in the past.  She reports she was not very happy with [facility] and wanted a change.  She reports history of trauma.  She  reports her son passed away in a motor vehicle accident when she was 72 years old.  She did not elaborate on it.  He was her only son.  She reports family support helped her to get through it.  She describes it as a very hard phase of her life.  Her husband of 40 years passed away from glioblastoma in 2010.  She reports she took care of him for 18 months.  She reports it very hard having to go through that.  She however reports that her husband was very good to her and they had a very  good life together.  She denies any substance abuse problems.  She reports she is retired.  She reports her sister lives a few miles away from her and supports her.  She also has nieces  who loves her.    Associated Signs/Symptoms: Depression Symptoms:  Denies (Hypo) Manic Symptoms:  Labiality of Mood, racing thoughts  Anxiety Symptoms:  denies Psychotic Symptoms:  denies PTSD Symptoms: Negative  Past Psychiatric History: History of bipolar disorder since the past 40 years or so.  She reports several inpatient mental health admissions.  She reports admission at [facility], [facility].  She used to follow up with Dr. Edwin Dada as well with [facility] in the past. She denies any suicide attempts.  Previous Psychotropic Medications: Yes Prozac, lithium, Zyprexa.  Substance Abuse History in the last 12 months:  No.  Consequences of Substance Abuse: Negative  Past Medical History:  Past Medical History:  Diagnosis Date  . Bipolar 1 disorder (HCC) 1995   Dr. Caryn Section [phone])  . Ex-smoker 2001   minimal  . Osteoporosis 2015   fosamax reaction, then was on boniva IV for ~1 yr  . Postmenopausal    age 45  . Vitamin D deficiency     Past Surgical History:  Procedure Laterality Date  . CESAREAN SECTION    . DEXA  09/2013   T -2.8 femur  . TUBAL LIGATION     with reversal    Family Psychiatric History: Mother-bipolar disorder.  She reports her mother was admitted at the  hospital in [place] and also received ECT treatment.  Her mother is deceased.  She also reports maternal grandmother was depressed-tried to kill herself.  Family History:  Family History  Problem Relation Age of Onset  . Thyroid disease Other        runs in family  . Diabetes Brother   . Bipolar disorder Mother   . Depression Mother   . Anxiety disorder Mother   . Stroke Neg Hx   . CAD Neg Hx   . Cancer Neg Hx     Social History:   Social History   Socioeconomic History  . Marital status: Widowed    Spouse name: None  . Number of children: 1  . Years of education: None  . Highest education level: Associate degree: occupational, Scientist, product/process development, or vocational program  Social Needs  . Financial resource strain: Not hard at all  . Food insecurity - worry: Never true  . Food insecurity - inability: Never true  . Transportation needs - medical: No  . Transportation needs - non-medical: No  Occupational History    Comment: retired  Tobacco Use  . Smoking status: Former Smoker    Last attempt to quit: 03/10/1999    Years since quitting: 18.1  . Smokeless tobacco: Never Used  Substance and Sexual Activity  . Alcohol use: No  . Drug use: No  . Sexual activity: Not Currently  Other Topics Concern  . None  Social History Narrative   Lives alone with 2 dogs, widow   30 yrs ago son died at age 45yo (hit by car).   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  She has social support from her sister .  She is religious-follows Babaism.  She longs to go to [place] and currently go to a Hindu center when she can.  She loves making jewelry.  She and her husband were married for 40 years.  Her husband used to be a [place] vet as well as worked in Equities trader.  Her husband passed  away from a brain tumor in August 08, 2008.  Her only son passed away in a motor vehicle accident more than 30 years ago.  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  She is currently retired.  Allergies:   Allergies  Allergen Reactions  . Alendronate Sodium Other (See Comments)    GI distress GI distress  . Amoxicillin Hives    Metabolic Disorder Labs: Lab Results  Component Value Date   HGBA1C 5.1 12/24/2015   No  results found for: PROLACTIN Lab Results  Component Value Date   CHOL 289 (H) 12/24/2015   TRIG 236.0 (H) 12/24/2015   HDL 43.60 12/24/2015   CHOLHDL 7 12/24/2015   VLDL 47.2 (H) 12/24/2015   LDLCALC 202 (H) 08/15/2013     Current Medications: Current Outpatient Medications  Medication Sig Dispense Refill  . cholecalciferol (VITAMIN D) 1000 UNITS tablet Take 2 tablets (2,000 Units total) by mouth daily.    . divalproex (DEPAKOTE ER) 500 MG 24 hr tablet Take by mouth.    . QUEtiapine (SEROQUEL) 200 MG tablet Take 1 tablet (200 mg total) by mouth at bedtime. 90 tablet 0  . simvastatin (ZOCOR) 20 MG tablet Take 1 tablet (20 mg total) by mouth every evening. 90 tablet 3   No current facility-administered medications for this visit.     Neurologic: Headache: No Seizure: No Paresthesias:No  Musculoskeletal: Strength & Muscle Tone: within normal limits Gait & Station: normal Patient leans: N/A  Psychiatric Specialty Exam: Review of Systems  Psychiatric/Behavioral: The patient is nervous/anxious.   All other systems reviewed and are negative.   Blood pressure 136/87, pulse 96, temperature 98.3 F (36.8 C), temperature source Oral, weight 152 lb 12.8 oz (69.3 kg).Body mass index is 26.23 kg/m.  General Appearance: Casual  Eye Contact:  Fair  Speech:  Clear and Coherent  Volume:  Normal  Mood:  Anxious  Affect:  Congruent  Thought Process:  Goal Directed and Descriptions of Associations: Circumstantial  Orientation:  Full (Time, Place, and  Person)  Thought Content:  Logical  Suicidal Thoughts:  No  Homicidal Thoughts:  No  Memory:  Immediate;   Fair Recent;   Fair Remote;   Fair  Judgement:  Fair  Insight:  Fair  Psychomotor Activity:  Normal  Concentration:  Concentration: Fair and Attention Span: Fair  Recall:  FiservFair  Fund of Knowledge:Fair  Language: Fair  Akathisia:  No  Handed:  Right  AIMS (if indicated):  Denies any rigidity, tremors, stiffness  Assets:  Communication Skills Desire for Improvement Financial Resources/Insurance Housing Physical Health Social Support  ADL's:  Intact  Cognition: WNL  Sleep:  fair    Treatment Plan Summary: Erskine SquibbJane is a 72 year old Caucasian female who has a history of bipolar disorder, lithium toxicity, presented to the clinic today to establish care.  Erskine SquibbJane is currently doing well on her medication.  She has a chronic history of bipolar disorder.  She is motivated to stay on her medication and denies any suicidality.  She does have good social support.  She does have a history of trauma-death of her son as well as death of her husband.  She however has been coping well with the loss and is really just. Plan as noted below. Medication management and Plan see below   Plan  For bipolar disorder Continue Depakote ER 1000 mg p.o. nightly Will get Depakote level-provided lab slip. Continue Seroquel 200 mg p.o. nightly.  Gave her 90-day supply.  Review[facility] records from [facility] as well as [facility].  Ordered labs-CMP, CBC, Depakote level, vitamin D, vitamin B12, folate, prolactin.  Patient to sign release to obtain medical records from her primary medical doctor.  She reports she had recent labs done at her primary medical doctor and TSH, hemoglobin A1c and lipid panel may be included in those.  Follow-up in clinic in 8 weeks or sooner if needed.  More than 50 % of the time was spent for psychoeducation and supportive psychotherapy and  care coordination.  This  note was generated in part or whole with voice recognition software. Voice recognition is usually quite accurate but there are transcription errors that can and very often do occur. I apologize for any typographical errors that were not detected and corrected.       Jomarie Longs, MD 2/7/20192:02 PM

## 2017-04-16 ENCOUNTER — Other Ambulatory Visit: Payer: Self-pay | Admitting: Psychiatry

## 2017-04-16 DIAGNOSIS — F3110 Bipolar disorder, current episode manic without psychotic features, unspecified: Secondary | ICD-10-CM | POA: Diagnosis not present

## 2017-04-17 LAB — COMPREHENSIVE METABOLIC PANEL
ALK PHOS: 64 IU/L (ref 39–117)
ALT: 13 IU/L (ref 0–32)
AST: 14 IU/L (ref 0–40)
Albumin/Globulin Ratio: 1.6 (ref 1.2–2.2)
Albumin: 4.2 g/dL (ref 3.5–4.8)
BILIRUBIN TOTAL: 0.2 mg/dL (ref 0.0–1.2)
BUN / CREAT RATIO: 17 (ref 12–28)
BUN: 14 mg/dL (ref 8–27)
CHLORIDE: 105 mmol/L (ref 96–106)
CO2: 21 mmol/L (ref 20–29)
Calcium: 9.3 mg/dL (ref 8.7–10.3)
Creatinine, Ser: 0.84 mg/dL (ref 0.57–1.00)
GFR calc Af Amer: 81 mL/min/{1.73_m2} (ref >59)
GFR calc non Af Amer: 70 mL/min/{1.73_m2} (ref >59)
Globulin, Total: 2.6 g/dL (ref 1.5–4.5)
Glucose: 96 mg/dL (ref 65–99)
Potassium: 3.9 mmol/L (ref 3.5–5.2)
Sodium: 145 mmol/L — ABNORMAL HIGH (ref 134–144)
Total Protein: 6.8 g/dL (ref 6.0–8.5)

## 2017-04-17 LAB — VALPROIC ACID LEVEL: Valproic Acid Lvl: 69 ug/mL (ref 50–100)

## 2017-04-17 LAB — VITAMIN D 25 HYDROXY (VIT D DEFICIENCY, FRACTURES): VIT D 25 HYDROXY: 30.9 ng/mL (ref 30.0–100.0)

## 2017-04-17 LAB — B12 AND FOLATE PANEL
FOLATE: 10.8 ng/mL (ref >3.0)
VITAMIN B 12: 551 pg/mL (ref 232–1245)

## 2017-04-17 LAB — CBC WITH DIFFERENTIAL/PLATELET
BASOS ABS: 0 10*3/uL (ref 0.0–0.2)
Basos: 0 %
EOS (ABSOLUTE): 0.5 10*3/uL — AB (ref 0.0–0.4)
Eos: 8 %
Hematocrit: 36.8 % (ref 34.0–46.6)
Hemoglobin: 12.6 g/dL (ref 11.1–15.9)
Immature Grans (Abs): 0 10*3/uL (ref 0.0–0.1)
Immature Granulocytes: 0 %
LYMPHS ABS: 1.7 10*3/uL (ref 0.7–3.1)
Lymphs: 30 %
MCH: 32.4 pg (ref 26.6–33.0)
MCHC: 34.2 g/dL (ref 31.5–35.7)
MCV: 95 fL (ref 79–97)
MONOS ABS: 0.5 10*3/uL (ref 0.1–0.9)
Monocytes: 9 %
NEUTROS ABS: 3 10*3/uL (ref 1.4–7.0)
Neutrophils: 53 %
PLATELETS: 194 10*3/uL (ref 150–379)
RBC: 3.89 x10E6/uL (ref 3.77–5.28)
RDW: 13.4 % (ref 12.3–15.4)
WBC: 5.7 10*3/uL (ref 3.4–10.8)

## 2017-04-17 LAB — PROLACTIN: Prolactin: 10.8 ng/mL (ref 4.8–23.3)

## 2017-05-11 ENCOUNTER — Telehealth: Payer: Self-pay | Admitting: Family

## 2017-05-11 NOTE — Telephone Encounter (Signed)
Mail pt  I reviewing your chart, it appears that you are due for CT of the chest for a lung cancer screen. It is annual test.  From an insurance perspective, as long as you meet the following criteria, it is paid for since it is preventative care.    Criteria for low dose lung cancer screening scan:  Age between 55-77   Current smoker or former if quit within the last 15 years  History of smoking at least 1 pack a day for 30 years or that equivalent. (2 packs a day for 15 years,  pack a day for 60 years)  I had ordered in the past for you.   If it is a screen that you a still interested in, please call the office or send me a mychart message to let me know so that I can order for you.   Hope you are well.  Best,  Quatavious Rossa, NP  

## 2017-05-11 NOTE — Telephone Encounter (Signed)
Letter printed and mailed.  

## 2017-05-25 ENCOUNTER — Telehealth: Payer: Self-pay

## 2017-05-25 NOTE — Telephone Encounter (Signed)
Copied from CRM [id number]. Topic: General - Other >> May 25, 2017 10:40 AM Guinevere FerrariMorris, Sharamare E, NT wrote: Reason for CRM: Patient called and said she keep getting notifications to get a CT Scan and does not wish to get one and wants the notifications to stop.

## 2017-05-26 NOTE — Telephone Encounter (Signed)
Not sure why she is getting notifications since she has not had a past ct, future ct or a ct that has been ordered so I don't know how to make the notifications stop. Erie NoeVanessa can you look at this?

## 2017-05-26 NOTE — Telephone Encounter (Signed)
I'm not sure why she is getting notifications for CT's. She doesn't have any scheduled, past appointments or orders to have a ct.

## 2017-05-26 NOTE — Telephone Encounter (Signed)
I spoke with the patient she stated she does not want the CT done she does not see a need for it.

## 2017-06-10 ENCOUNTER — Ambulatory Visit (INDEPENDENT_AMBULATORY_CARE_PROVIDER_SITE_OTHER): Payer: Medicare Other | Admitting: Psychiatry

## 2017-06-10 ENCOUNTER — Encounter: Payer: Self-pay | Admitting: Psychiatry

## 2017-06-10 ENCOUNTER — Other Ambulatory Visit: Payer: Self-pay

## 2017-06-10 VITALS — BP 125/82 | HR 76 | Temp 98.0°F | Wt 144.6 lb

## 2017-06-10 DIAGNOSIS — F3110 Bipolar disorder, current episode manic without psychotic features, unspecified: Secondary | ICD-10-CM | POA: Diagnosis not present

## 2017-06-10 MED ORDER — QUETIAPINE FUMARATE 200 MG PO TABS: 200.0000 mg | ORAL_TABLET | Freq: Every day | ORAL | 1 refills | Status: DC

## 2017-06-10 MED ORDER — DIVALPROEX SODIUM ER 500 MG PO TB24: 1000.0000 mg | ORAL_TABLET | Freq: Every day | ORAL | 1 refills | Status: DC

## 2017-06-10 NOTE — Progress Notes (Signed)
BH MD OP Progress Note  06/10/2017 1:39 PM [name]  MRN:  [id number]  Chief Complaint: ' I am doing well." Chief Complaint    Follow-up; Medication Refill     HPI: Rachel Macias is a 72 year old Caucasian female, widowed, lives in [place], has a history of bipolar disorder, presented to the clinic today for a follow-up visit.    Rachel Macias today reports that she is currently doing well on the current medication regimen.  She reports she likes the Depakote more than the lithium.  She reports that Depakote has been helping keeping her mood stable.  She denies any side effects from the same.  She reports she continues to sleep good.  She reports her Seroquel has been effective with the same.  She reports she feels her concentration and focus has improved.  She reports she was able to read several books in a series of mystery novels written by Leota SauersLouise Penny.  Patient reports that she enjoyed reading them.  She also reports she has been getting some dental work done.    She denies any other concerns today.  Discussed her Depakote labs with patient discussed with her its therapeutic.  Also discussed her other labs which were ordered during her last visit. Visit Diagnosis:    ICD-10-CM   1. Bipolar I disorder, most recent episode (or current) manic (HCC) F31.10 QUEtiapine (SEROQUEL) 200 MG tablet    divalproex (DEPAKOTE ER) 500 MG 24 hr tablet   in early remission    Past Psychiatric History: History of bipolar disorder since the past 40 years or so.  She reports several inpatient mental health admissions.  [facility].  She used to follow-up with Dr. Edwin DadaKapoor as well as [facility] in the past.  She denies any suicide attempts.  Trials of Prozac, lithium, Zyprexa.  Past Medical History:  Past Medical History:  Diagnosis Date  . Bipolar 1 disorder (HCC) 1995   Dr. Caryn SectionAarti Kapur [phone])  . Ex-smoker 2001   minimal  . Osteoporosis 2015   fosamax  reaction, then was on boniva IV for ~1 yr  . Postmenopausal    age 72  . Vitamin D deficiency     Past Surgical History:  Procedure Laterality Date  . CESAREAN SECTION    . DEXA  09/2013   T -2.8 femur  . TUBAL LIGATION     with reversal    Family Psychiatric History:Mother -Bipolar disorder.  She reports her mother was admitted at the hospital in [place] and also received ECT treatment.  Her mother is deceased.  She also reports maternal grandmother was depressed-tried to kill herself.  Family History:  Family History  Problem Relation Age of Onset  . Thyroid disease Other        runs in family  . Diabetes Brother   . Bipolar disorder Mother   . Depression Mother   . Anxiety disorder Mother   . Stroke Neg Hx   . CAD Neg Hx   . Cancer Neg Hx    Substance abuse history: Denies  Social History: Currently lives in [place].  She has social support from her sister.  She is religious-follows Babaism. She loves making jewelry.  She is currently widowed.  She and her husband were married for 40 years. Husband used to be a [place] vet as well as worked in [employer]ities [employer] field he passed away from a brain tumor in 2010.  Her son passed away in a motor  vehicle accident more than 30 years ago.  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  She is currently retired. Social History   Socioeconomic History  . Marital status: Widowed    Spouse name: Not on file  . Number of children: 1  . Years of education: Not on file  . Highest education level: Associate degree: occupational, Scientist, product/process development, or vocational program  Occupational History    Comment: retired  Engineer, production  . Financial resource strain: Not hard at all  . Food insecurity:    Worry: Never true    Inability: Never true  . Transportation needs:    Medical: No    Non-medical: No  Tobacco Use  . Smoking status: Former Smoker    Last attempt to quit: 03/10/1999    Years since quitting: 18.2  . Smokeless tobacco: Never Used   Substance and Sexual Activity  . Alcohol use: No  . Drug use: No  . Sexual activity: Not Currently  Lifestyle  . Physical activity:    Days per week: 7 days    Minutes per session: 30 min  . Stress: Not at all  Relationships  . Social connections:    Talks on phone: Three times a week    Gets together: Once a week    Attends religious service: More than 4 times per year    Active member of club or organization: No    Attends meetings of clubs or organizations: Never    Relationship status: Widowed  Other Topics Concern  . Not on file  Social History Narrative   Lives alone with 2 dogs, widow   30 yrs ago son died at age 20yo (hit by car).   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 Alendronate Sodium Other (See Comments)    GI distress GI distress  . Amoxicillin Hives    Metabolic Disorder Labs: Lab Results  Component Value Date   HGBA1C 5.1 12/24/2015   Lab Results  Component Value Date   PROLACTIN 10.8 04/16/2017   Lab Results  Component Value Date   CHOL 289 (H) 12/24/2015   TRIG 236.0 (H) 12/24/2015   HDL 43.60 12/24/2015   CHOLHDL 7 12/24/2015   VLDL 47.2 (H) 12/24/2015   LDLCALC 202 (H) 08/15/2013   Lab Results  Component Value Date   TSH 3.30 12/24/2015   TSH 2.93 10/12/2014    Therapeutic Level Labs: Lab Results  Component Value Date   LITHIUM 1.40 (H) 08/15/2016   LITHIUM 2.10 (HH) 08/15/2016   Lab Results  Component Value Date   VALPROATE 69 04/16/2017   No components found for:  CBMZ  Current Medications: Current Outpatient Medications  Medication Sig Dispense Refill  . chlorhexidine (PERIDEX) 0.12 % solution SWISH AND EXP 10 ML PO BID  0  . cholecalciferol (VITAMIN D) 1000 UNITS tablet Take 2 tablets (2,000 Units total)  by mouth daily.    . divalproex (DEPAKOTE ER) 500 MG 24 hr tablet Take 2 tablets (1,000 mg total) by mouth at bedtime. 180 tablet 1  . QUEtiapine (SEROQUEL) 200 MG tablet Take 1 tablet (200 mg total) by mouth at bedtime. 90 tablet 1  . simvastatin (ZOCOR) 20 MG tablet Take 1 tablet (20  mg total) by mouth every evening. 90 tablet 3   No current facility-administered medications for this visit.      Musculoskeletal: Strength & Muscle Tone: within normal limits Gait & Station: normal Patient leans: N/A  Psychiatric Specialty Exam: Review of Systems  Psychiatric/Behavioral: Positive for depression (improved). The patient is not nervous/anxious.   All other systems reviewed and are negative.   Blood pressure 125/82, pulse 76, temperature 98 F (36.7 C), temperature source Oral, weight 144 lb 9.6 oz (65.6 kg).Body mass index is 24.82 kg/m.  General Appearance: Casual  Eye Contact:  Fair  Speech:  Normal Rate  Volume:  Normal  Mood:  Euthymic  Affect:  Congruent  Thought Process:  Goal Directed and Descriptions of Associations: Intact  Orientation:  Full (Time, Place, and Person)  Thought Content: Logical   Suicidal Thoughts:  No  Homicidal Thoughts:  No  Memory:  Immediate;   Fair Recent;   Fair Remote;   Fair  Judgement:  Fair  Insight:  Fair  Psychomotor Activity:  Normal  Concentration:  Concentration: Fair and Attention Span: Fair  Recall:  Fiserv of Knowledge: Fair  Language: Fair  Akathisia:  No  Handed:  Right  AIMS (if indicated): 0  Assets:  Communication Skills Desire for Improvement Housing Social Support  ADL's:  Intact  Cognition: WNL  Sleep:  Fair   Screenings: PHQ2-9     Office Visit from 05/29/2016 in [facility] Office Visit from 02/12/2016 in [facility] Office Visit from 09/14/2014 in [facility] Visit from 08/22/2013 in [facility]  PHQ-2 Total Score  0  0   0  0       Assessment and Plan: Rachel Macias is a 72 year old Caucasian female who has a history of bipolar disorder, lithium toxicity, presented to the clinic today for a follow-up visit.  Patient lives in [place], widowed, retired.  She continues to do well on the current medication regimen.  She had recent labs done that were discussed with her.  She reports the medications as helpful with her concentration and mood.  Will continue plan as noted below.  Plan For bipolar disorder Continue Depakote ER 1000 mg p.o. nightly Depakote level-done on 04/16/2017-69-therapeutic. Discussed with patient today. Continue Seroquel 200 mg p.o. nightly  Discussed the following labs with patient- vitamin D-30.9-within normal limits, vitamin B12- 551 - wnl, folate - 10.8 - wnl, prolactin -10.8 - wnl.  Patient to continue leisure activities, exercise.  Provided supportive psychotherapy for 10 minutes.  Follow-up in clinic in 3 months or sooner if needed.  More than 50 % of the time was spent for psychoeducation and supportive psychotherapy and care coordination.  This note was generated in part or whole with voice recognition software. Voice recognition is usually quite accurate but there are transcription errors that can and very often do occur. I apologize for any typographical errors that were not detected and corrected.       Jomarie Longs, MD 06/10/2017, 1:39 PM

## 2017-09-07 ENCOUNTER — Other Ambulatory Visit: Payer: Self-pay

## 2017-09-07 ENCOUNTER — Ambulatory Visit (INDEPENDENT_AMBULATORY_CARE_PROVIDER_SITE_OTHER): Payer: Medicare Other | Admitting: Psychiatry

## 2017-09-07 ENCOUNTER — Encounter: Payer: Self-pay | Admitting: Psychiatry

## 2017-09-07 VITALS — BP 128/82 | HR 82 | Temp 97.9°F | Wt 144.6 lb

## 2017-09-07 DIAGNOSIS — F3110 Bipolar disorder, current episode manic without psychotic features, unspecified: Secondary | ICD-10-CM | POA: Diagnosis not present

## 2017-09-07 DIAGNOSIS — G4701 Insomnia due to medical condition: Secondary | ICD-10-CM

## 2017-09-07 MED ORDER — TRAZODONE HCL 50 MG PO TABS: 25.0000 mg | ORAL_TABLET | Freq: Every evening | ORAL | 2 refills | Status: DC | PRN

## 2017-09-07 NOTE — Patient Instructions (Signed)
Trazodone tablets What is this medicine? TRAZODONE (TRAZ oh done) is used to treat depression. This medicine may be used for other purposes; ask your health care provider or pharmacist if you have questions. COMMON BRAND NAME(S): Desyrel What should I tell my health care provider before I take this medicine? They need to know if you have any of these conditions: -attempted suicide or thinking about it -bipolar disorder -bleeding problems -glaucoma -heart disease, or previous heart attack -irregular heart beat -kidney or liver disease -low levels of sodium in the blood -an unusual or allergic reaction to trazodone, other medicines, foods, dyes or preservatives -pregnant or trying to get pregnant -breast-feeding How should I use this medicine? Take this medicine by mouth with a glass of water. Follow the directions on the prescription label. Take this medicine shortly after a meal or a light snack. Take your medicine at regular intervals. Do not take your medicine more often than directed. Do not stop taking this medicine suddenly except upon the advice of your doctor. Stopping this medicine too quickly may cause serious side effects or your condition may worsen. A special MedGuide will be given to you by the pharmacist with each prescription and refill. Be sure to read this information carefully each time. Talk to your pediatrician regarding the use of this medicine in children. Special care may be needed. Overdosage: If you think you have taken too much of this medicine contact a poison control center or emergency room at once. NOTE: This medicine is only for you. Do not share this medicine with others. What if I miss a dose? If you miss a dose, take it as soon as you can. If it is almost time for your next dose, take only that dose. Do not take double or extra doses. What may interact with this medicine? Do not take this medicine with any of the following medications: -certain medicines  for fungal infections like fluconazole, itraconazole, ketoconazole, posaconazole, voriconazole -cisapride -dofetilide -dronedarone -linezolid -MAOIs like Carbex, Eldepryl, Marplan, Nardil, and Parnate -mesoridazine -methylene blue (injected into a vein) -pimozide -saquinavir -thioridazine -ziprasidone This medicine may also interact with the following medications: -alcohol -antiviral medicines for HIV or AIDS -aspirin and aspirin-like medicines -barbiturates like phenobarbital -certain medicines for blood pressure, heart disease, irregular heart beat -certain medicines for depression, anxiety, or psychotic disturbances -certain medicines for migraine headache like almotriptan, eletriptan, frovatriptan, naratriptan, rizatriptan, sumatriptan, zolmitriptan -certain medicines for seizures like carbamazepine and phenytoin -certain medicines for sleep -certain medicines that treat or prevent blood clots like dalteparin, enoxaparin, warfarin -digoxin -fentanyl -lithium -NSAIDS, medicines for pain and inflammation, like ibuprofen or naproxen -other medicines that prolong the QT interval (cause an abnormal heart rhythm) -rasagiline -supplements like St. John's wort, kava kava, valerian -tramadol -tryptophan This list may not describe all possible interactions. Give your health care provider a list of all the medicines, herbs, non-prescription drugs, or dietary supplements you use. Also tell them if you smoke, drink alcohol, or use illegal drugs. Some items may interact with your medicine. What should I watch for while using this medicine? Tell your doctor if your symptoms do not get better or if they get worse. Visit your doctor or health care professional for regular checks on your progress. Because it may take several weeks to see the full effects of this medicine, it is important to continue your treatment as prescribed by your doctor. Patients and their families should watch out for new  or worsening thoughts of suicide or depression. Also   watch out for sudden changes in feelings such as feeling anxious, agitated, panicky, irritable, hostile, aggressive, impulsive, severely restless, overly excited and hyperactive, or not being able to sleep. If this happens, especially at the beginning of treatment or after a change in dose, call your health care professional. You may get drowsy or dizzy. Do not drive, use machinery, or do anything that needs mental alertness until you know how this medicine affects you. Do not stand or sit up quickly, especially if you are an older patient. This reduces the risk of dizzy or fainting spells. Alcohol may interfere with the effect of this medicine. Avoid alcoholic drinks. This medicine may cause dry eyes and blurred vision. If you wear contact lenses you may feel some discomfort. Lubricating drops may help. See your eye doctor if the problem does not go away or is severe. Your mouth may get dry. Chewing sugarless gum, sucking hard candy and drinking plenty of water may help. Contact your doctor if the problem does not go away or is severe. What side effects may I notice from receiving this medicine? Side effects that you should report to your doctor or health care professional as soon as possible: -allergic reactions like skin rash, itching or hives, swelling of the face, lips, or tongue -elevated mood, decreased need for sleep, racing thoughts, impulsive behavior -confusion -fast, irregular heartbeat -feeling faint or lightheaded, falls -feeling agitated, angry, or irritable -loss of balance or coordination -painful or prolonged erections -restlessness, pacing, inability to keep still -suicidal thoughts or other mood changes -tremors -trouble sleeping -seizures -unusual bleeding or bruising Side effects that usually do not require medical attention (report to your doctor or health care professional if they continue or are bothersome): -change in  sex drive or performance -change in appetite or weight -constipation -headache -muscle aches or pains -nausea This list may not describe all possible side effects. Call your doctor for medical advice about side effects. You may report side effects to FDA at 1-800-FDA-1088. Where should I keep my medicine? Keep out of the reach of children. Store at room temperature between 15 and 30 degrees C (59 to 86 degrees F). Protect from light. Keep container tightly closed. Throw away any unused medicine after the expiration date. NOTE: This sheet is a summary. It may not cover all possible information. If you have questions about this medicine, talk to your doctor, pharmacist, or health care provider.  2018 Elsevier/Gold Standard (2015-07-25 16:57:05)  

## 2017-09-07 NOTE — Progress Notes (Signed)
[facility] MD OP Progress Note  09/07/2017 1:21 PM Rachel Macias  MRN:  [id number]  Chief Complaint: ' I am here for follow up." Chief Complaint    Follow-up; Medication Refill     HPI: Rachel Macias is a 72 year old Caucasian female, widowed, lives in [place], has a history of bipolar disorder, presented to the clinic today for a follow-up visit.  She today reports she has been having some trouble with sleep.  She reports there are days when she wakes up in the middle of the night and is unable to go back to sleep.  She has been taking Seroquel at bedtime.  Patient reports her mood as stable.  She however reports that at times when she gets extremely lonely.  She reports he does not because she does not know what she is capable of or what kind of activities she can do rather it is because she lives in a town where there is not much activities that she can participate in.  Patient reports she used to live in [place] which had much more opportunities in the past.  Patient continues to struggle with some dry mouth.  She reports she started using Biotene mouth rinse.  She wonders if there is anything else that can be helpful with her dry mouth.  Patient denies any suicidality.  Patient denies any perceptual disturbances.  Discussed referral for psychotherapy with Ms. Nolon Rod.  Patient agrees with plan.   Visit Diagnosis:    ICD-10-CM   1. Bipolar I disorder, most recent episode (or current) manic (HCC) F31.10   2. Insomnia due to medical condition G47.01     Past Psychiatric History: Reviewed past psychiatric history from my progress note on 06/10/2017.  Past trials of Prozac, lithium, Zyprexa.  Past Medical History:  Past Medical History:  Diagnosis Date  . Bipolar 1 disorder (HCC) 1995   Dr. Caryn Section [phone])  . Ex-smoker 2001   minimal  . Osteoporosis 2015   fosamax reaction, then was on boniva IV for ~1 yr  . Postmenopausal    age 89  . Vitamin D deficiency     Past  Surgical History:  Procedure Laterality Date  . CESAREAN SECTION    . DEXA  09/2013   T -2.8 femur  . TUBAL LIGATION     with reversal    Family Psychiatric History: I have reviewed family psychiatric history from my progress note on 06/10/2017.  Family History:  Family History  Problem Relation Age of Onset  . Thyroid disease Other        runs in family  . Diabetes Brother   . Bipolar disorder Mother   . Depression Mother   . Anxiety disorder Mother   . Stroke Neg Hx   . CAD Neg Hx   . Cancer Neg Hx    Substance abuse history: Denies  Social History: Reviewed social history from my progress note on 06/10/2017. Social History   Socioeconomic History  . Marital status: Widowed    Spouse name: Not on file  . Number of children: 1  . Years of education: Not on file  . Highest education level: Associate degree: occupational, Scientist, product/process development, or vocational program  Occupational History    Comment: retired  Engineer, production  . Financial resource strain: Not hard at all  . Food insecurity:    Worry: Never true    Inability: Never true  . Transportation needs:    Medical: No    Non-medical: No  Tobacco Use  . Smoking status: Former Smoker    Last attempt to quit: 03/10/1999    Years since quitting: 18.5  . Smokeless tobacco: Never Used  Substance and Sexual Activity  . Alcohol use: No  . Drug use: No  . Sexual activity: Not Currently  Lifestyle  . Physical activity:    Days per week: 7 days    Minutes per session: 30 min  . Stress: Not at all  Relationships  . Social connections:    Talks on phone: Three times a week    Gets together: Once a week    Attends religious service: More than 4 times per year    Active member of club or organization: No    Attends meetings of clubs or organizations: Never    Relationship status: Widowed  Other Topics Concern  . Not on file  Social History Narrative   Lives alone with 2 dogs, widow   30 yrs ago son died at age 913yo (hit by  car).   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 Alendronate Sodium Other (See Comments)    GI distress GI distress  . Amoxicillin Hives    Metabolic Disorder Labs: Lab Results  Component Value Date   HGBA1C 5.1 12/24/2015   Lab Results  Component Value Date   PROLACTIN 10.8 04/16/2017   Lab Results  Component Value Date   CHOL 289 (H) 12/24/2015   TRIG 236.0 (H) 12/24/2015   HDL 43.60 12/24/2015   CHOLHDL 7 12/24/2015   VLDL 47.2 (H) 12/24/2015   LDLCALC 202 (H) 08/15/2013   Lab Results  Component Value Date   TSH 3.30 12/24/2015   TSH 2.93 10/12/2014    Therapeutic Level Labs: Lab Results  Component Value Date   LITHIUM 1.40 (H) 08/15/2016   LITHIUM 2.10 (HH) 08/15/2016   Lab Results  Component Value Date   VALPROATE 69 04/16/2017   No components found for:  CBMZ  Current Medications: Current Outpatient Medications  Medication Sig Dispense Refill  . chlorhexidine (PERIDEX) 0.12 % solution SWISH AND EXP 10 ML PO BID  0  . cholecalciferol (VITAMIN D) 1000 UNITS tablet Take 2 tablets (2,000 Units total) by mouth daily.    . divalproex (DEPAKOTE ER) 500 MG 24 hr tablet Take 2 tablets (1,000 mg total) by mouth at bedtime. 180 tablet 1  . QUEtiapine (SEROQUEL) 200 MG tablet Take 1 tablet (200 mg total) by mouth at bedtime. 90 tablet 1  . simvastatin (ZOCOR) 20 MG tablet Take 1 tablet (20 mg total) by mouth every evening. 90 tablet 3  . traZODone (DESYREL) 50 MG tablet Take 0.5-1 tablets (25-50 mg total) by mouth at bedtime as needed for sleep. 301 tablet 2   No current facility-administered medications for this visit.      Musculoskeletal: Strength & Muscle Tone: within normal limits Gait & Station: normal Patient leans: N/A  Psychiatric  Specialty Exam: Review of Systems  Psychiatric/Behavioral: The patient is nervous/anxious and has insomnia.   All other systems reviewed and are negative.   Blood pressure 128/82, pulse 82, temperature 97.9 F (36.6 C), temperature source Oral, weight 144 lb 9.6 oz (65.6 kg).Body mass index is 24.82 kg/m.  General Appearance: Casual  Eye Contact:  Fair  Speech:  Clear and Coherent  Volume:  Normal  Mood:  Anxious  Affect:  Congruent  Thought Process:  Goal Directed and Descriptions of Associations: Intact  Orientation:  Full (Time, Place, and Person)  Thought Content: Logical   Suicidal Thoughts:  No  Homicidal Thoughts:  No  Memory:  Immediate;   Fair Recent;   Fair Remote;   Fair  Judgement:  Fair  Insight:  Fair  Psychomotor Activity:  Normal  Concentration:  Concentration: Fair and Attention Span: Fair  Recall:  [employer] of Knowledge: Fair  Language: Fair  Akathisia:  No  Handed:  Right  AIMS (if indicated): Denies tremors, rigidity, stiffness  Assets:  Communication Skills Desire for Improvement Social Support  ADL's:  Intact  Cognition: WNL  Sleep:  Poor   Screenings: PHQ2-9     Office Visit from 05/29/2016 in [facility] Office Visit from 02/12/2016 in [facility] Office Visit from 09/14/2014 in [facility] Visit from 08/22/2013 in [facility]  PHQ-2 Total Score  0  0  0  0       Assessment and Plan: Rachel Macias is a 72 year old Caucasian female who has a history of bipolar disorder, lithium toxicity, presented to the clinic today for a follow-up visit.  Patient  struggles with some restlessness, insomnia.  Patient also struggles with dry mouth.  Discussed medication changes as well as referral for psychotherapy.  Plan as noted below.  Plan For bipolar disorder Continue Depakote ER 1000 mg p.o. nightly. Depakote level-done on 04/16/2017-69-therapeutic.  Continue Seroquel 200 mg  p.o. Nightly. Start Trazodone 25-50 mg at bedtime as needed.  Discussed referral for psychotherapy since patient reports some difficulty coping with some situational stressors. Provided her information for Ms. Peacock here in clinic.  Follow-up in clinic in 6-8 weeks.  More than 50 % of the time was spent for psychoeducation and supportive psychotherapy and care coordination.  This note was generated in part or whole with voice recognition software. Voice recognition is usually quite accurate but there are transcription errors that can and very often do occur. I apologize for any typographical errors that were not detected and corrected.       Jomarie Longs, MD 09/08/2017, 3:47 PM

## 2017-09-08 ENCOUNTER — Encounter: Payer: Self-pay | Admitting: Psychiatry

## 2017-09-08 MED ORDER — TRAZODONE HCL 50 MG PO TABS: 25.0000 mg | ORAL_TABLET | Freq: Every evening | ORAL | 2 refills | Status: DC | PRN

## 2017-09-23 ENCOUNTER — Telehealth: Payer: Self-pay

## 2017-09-23 DIAGNOSIS — F3110 Bipolar disorder, current episode manic without psychotic features, unspecified: Secondary | ICD-10-CM

## 2017-09-23 NOTE — Telephone Encounter (Signed)
Medication management - Telephone call with patient to follow up on concern she was getting low on Depakote ER and Seroquel.  Called [facility] who had a refill on file for both and arranged for these to be sent. Informed patient, Caryn BeeKevin, their pharmacy technician reported she should have the medication by 09/28/17 as both would be filled for 90 days and sent out today. Patient reported she had enough of both medications until then and will call back if meds not received within the next week.

## 2017-10-20 ENCOUNTER — Ambulatory Visit: Payer: Self-pay | Admitting: Licensed Clinical Social Worker

## 2017-11-09 ENCOUNTER — Encounter: Payer: Self-pay | Admitting: Psychiatry

## 2017-11-09 ENCOUNTER — Other Ambulatory Visit: Payer: Self-pay

## 2017-11-09 ENCOUNTER — Ambulatory Visit (INDEPENDENT_AMBULATORY_CARE_PROVIDER_SITE_OTHER): Payer: Medicare Other | Admitting: Psychiatry

## 2017-11-09 VITALS — BP 149/90 | HR 80 | Temp 97.7°F | Wt 143.6 lb

## 2017-11-09 DIAGNOSIS — G4701 Insomnia due to medical condition: Secondary | ICD-10-CM | POA: Diagnosis not present

## 2017-11-09 DIAGNOSIS — F3110 Bipolar disorder, current episode manic without psychotic features, unspecified: Secondary | ICD-10-CM | POA: Diagnosis not present

## 2017-11-09 MED ORDER — DIVALPROEX SODIUM ER 500 MG PO TB24: 1000.0000 mg | ORAL_TABLET | Freq: Every day | ORAL | 1 refills | Status: DC

## 2017-11-09 MED ORDER — QUETIAPINE FUMARATE 200 MG PO TABS: 200.0000 mg | ORAL_TABLET | Freq: Every day | ORAL | 1 refills | Status: DC

## 2017-11-09 NOTE — Progress Notes (Signed)
BH MD OP Progress Note  11/09/2017 5:53 PM Rachel Macias  MRN:  [id number]  Chief Complaint: ' I am here for follow up." Chief Complaint    Follow-up; Medication Refill     HPI: Rachel Macias is a 72 year old Caucasian female, widowed, lives in [place], has a history of bipolar disorder, presented to the clinic today for a follow-up visit.  She today reports her mood symptoms are currently stable on the current medication regimen.  She had some trouble sleeping during her last visit.  She however reports she has been able to sleep better.  She reports she does not use trazodone as much.  There are some nights when she uses it if she wakes up in the middle of the night.  She however reports she does not have to wake up too early and that gives her time to rest in the morning.  Patient denies any anxiety symptoms.  She reports she is currently compliant on her medications as prescribed and denies any side effects.  She reports she decided not to establish care with therapist as discussed last visit.  She reports she has been making use of her social resources, doing her own meditation techniques and so on.  She reports that helps her and she does not want any psychotherapy at this time.  She also reports she is looking forward to a vacation she is going to take with her sister to [place] in March 2020.  She reports she has booked her tickets and is looking forward to that.  Patient denies any suicidality.  Patient denies any perceptual disturbances.  She denies any other concerns today.  Discussed getting her Depakote level as well as other labs and she agrees with plan.   Visit Diagnosis:    ICD-10-CM   1. Bipolar I disorder, most recent episode (or current) manic (HCC) F31.10 divalproex (DEPAKOTE ER) 500 MG 24 hr tablet    QUEtiapine (SEROQUEL) 200 MG tablet   in early remission  2. Insomnia due to medical condition G47.01     Past Psychiatric History: I have reviewed past psychiatric history from my  progress note on 06/10/2017.  Past trials of Prozac, lithium, Zyprexa  Past Medical History:  Past Medical History:  Diagnosis Date  . Bipolar 1 disorder (HCC) 1995   Dr. Caryn Section [phone])  . Ex-smoker 2001   minimal  . Osteoporosis 2015   fosamax reaction, then was on boniva IV for ~1 yr  . Postmenopausal    age 51  . Vitamin D deficiency     Past Surgical History:  Procedure Laterality Date  . CESAREAN SECTION    . DEXA  09/2013   T -2.8 femur  . TUBAL LIGATION     with reversal    Family Psychiatric History: Reviewed family psychiatric history from my progress note on 06/10/2017  Family History:  Family History  Problem Relation Age of Onset  . Thyroid disease Other        runs in family  . Diabetes Brother   . Bipolar disorder Mother   . Depression Mother   . Anxiety disorder Mother   . Stroke Neg Hx   . CAD Neg Hx   . Cancer Neg Hx     Social History: Reviewed social history from my progress note on 06/10/2017 Social History   Socioeconomic History  . Marital status: Widowed    Spouse name: Not on file  . Number of children: 1  . Years of education: Not  on file  . Highest education level: Associate degree: occupational, Scientist, product/process development, or vocational program  Occupational History    Comment: retired  Engineer, production  . Financial resource strain: Not hard at all  . Food insecurity:    Worry: Never true    Inability: Never true  . Transportation needs:    Medical: No    Non-medical: No  Tobacco Use  . Smoking status: Former Smoker    Last attempt to quit: 03/10/1999    Years since quitting: 18.6  . Smokeless tobacco: Never Used  Substance and Sexual Activity  . Alcohol use: No  . Drug use: No  . Sexual activity: Not Currently  Lifestyle  . Physical activity:    Days per week: 7 days    Minutes per session: 30 min  . Stress: Not at all  Relationships  . Social connections:    Talks on phone: Three times a week    Gets together: Once a week     Attends religious service: More than 4 times per year    Active member of club or organization: No    Attends meetings of clubs or organizations: Never    Relationship status: Widowed  Other Topics Concern  . Not on file  Social History Narrative   Lives alone with 2 dogs, widow   30 yrs ago son died at age 71yo (hit by car).   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 Alendronate Sodium Other (See Comments)    GI distress GI distress  . Amoxicillin Hives    Metabolic Disorder Labs: Lab Results  Component Value Date   HGBA1C 5.1 12/24/2015   Lab Results  Component Value Date   PROLACTIN 10.8 04/16/2017   Lab Results  Component Value Date   CHOL 289 (H) 12/24/2015   TRIG 236.0 (H) 12/24/2015   HDL 43.60 12/24/2015   CHOLHDL 7 12/24/2015   VLDL 47.2 (H) 12/24/2015   LDLCALC 202 (H) 08/15/2013   Lab Results  Component Value Date   TSH 3.30 12/24/2015   TSH 2.93 10/12/2014    Therapeutic Level Labs: Lab Results  Component Value Date   LITHIUM 1.40 (H) 08/15/2016   LITHIUM 2.10 (HH) 08/15/2016   Lab Results  Component Value Date   VALPROATE 69 04/16/2017   No components found for:  CBMZ  Current Medications: Current Outpatient Medications  Medication Sig Dispense Refill  . chlorhexidine (PERIDEX) 0.12 % solution SWISH AND EXP 10 ML PO BID  0  . cholecalciferol (VITAMIN D) 1000 UNITS tablet Take 2 tablets (2,000 Units total) by mouth daily.    . divalproex (DEPAKOTE ER) 500 MG 24 hr tablet Take 2 tablets (1,000 mg total) by mouth at bedtime. 180 tablet 1  . QUEtiapine (SEROQUEL) 200 MG tablet Take 1 tablet (200 mg total) by mouth at bedtime. 90 tablet 1  . simvastatin (ZOCOR) 20 MG tablet Take 1 tablet (20 mg total) by mouth every evening. 90 tablet  3  . traZODone (DESYREL) 50 MG tablet Take 0.5-1 tablets (25-50 mg total) by mouth at bedtime as needed for sleep. 30 tablet 2   No current facility-administered medications for this visit.      Musculoskeletal: Strength & Muscle Tone: within normal limits Gait &  Station: normal Patient leans: N/A  Psychiatric Specialty Exam: Review of Systems  Psychiatric/Behavioral: The patient is nervous/anxious.   All other systems reviewed and are negative.   Blood pressure (!) 149/90, pulse 80, temperature 97.7 F (36.5 C), temperature source Oral, weight 143 lb 9.6 oz (65.1 kg).Body mass index is 24.65 kg/m.  General Appearance: Casual  Eye Contact:  Fair  Speech:  Clear and Coherent  Volume:  Normal  Mood:  Anxious  Affect:  Congruent  Thought Process:  Goal Directed and Descriptions of Associations: Intact  Orientation:  Full (Time, Place, and Person)  Thought Content: Logical   Suicidal Thoughts:  No  Homicidal Thoughts:  No  Memory:  Immediate;   Fair Recent;   Fair Remote;   Fair  Judgement:  Fair  Insight:  Fair  Psychomotor Activity:  Normal  Concentration:  Concentration: Fair and Attention Span: Fair  Recall:  [employer] of Knowledge: Fair  Language: Fair  Akathisia:  No  Handed:  Right  AIMS (if indicated): 0  Assets:  Communication Skills Desire for Improvement Housing Social Support  ADL's:  Intact  Cognition: WNL  Sleep:  improving   Screenings: PHQ2-9     Office Visit from 05/29/2016 in [facility] [place] Office Visit from 02/12/2016 in [facility] [place] [facility] Visit from 09/14/2014 in [facility] Visit from 08/22/2013 in [facility]  PHQ-2 Total Score  0  0  0  0       Assessment and Plan: Trenae is a 72 year old Caucasian female who has a history of bipolar disorder,hx of lithium toxicity, presented to the clinic today for a follow-up visit.  Patient reports she is currently  doing well on the current medication regimen.  Will get Depakote level today.  Plan as noted below.  Plan For bipolar disorder Continue Depakote ER 1000 mg p.o. nightly Depakote level-on  04/16/2017-69-therapeutic.  Will order Depakote level today-provided her lab slip Continue Seroquel 200 mg p.o. nightly Continue trazodone 25-50 mg at bedtime as needed.  We will provide her lab slip to get her Depakote level as well as other labs like lipid panel, hemoglobin A1c, prolactin, CBC, CMP.   Follow-up in clinic in 3 months or sooner if needed  More than 50 % of the time was spent for psychoeducation and supportive psychotherapy and care coordination.  This note was generated in part or whole with voice recognition software. Voice recognition is usually quite accurate but there are transcription errors that can and very often do occur. I apologize for any typographical errors that were not detected and corrected.       Jomarie Longs, MD 11/09/2017, 5:53 PM

## 2018-01-04 ENCOUNTER — Ambulatory Visit: Payer: Medicare Other | Admitting: Family

## 2018-01-04 ENCOUNTER — Ambulatory Visit: Payer: Medicare Other

## 2018-01-05 ENCOUNTER — Ambulatory Visit: Payer: Medicare Other

## 2018-01-05 ENCOUNTER — Ambulatory Visit: Payer: Medicare Other | Admitting: Family

## 2018-01-12 ENCOUNTER — Ambulatory Visit (INDEPENDENT_AMBULATORY_CARE_PROVIDER_SITE_OTHER): Payer: Medicare Other | Admitting: Family

## 2018-01-12 ENCOUNTER — Ambulatory Visit (INDEPENDENT_AMBULATORY_CARE_PROVIDER_SITE_OTHER): Payer: Medicare Other

## 2018-01-12 ENCOUNTER — Encounter: Payer: Self-pay | Admitting: Family

## 2018-01-12 VITALS — BP 122/84 | HR 93 | Temp 98.4°F | Resp 16 | Ht 64.0 in | Wt 144.5 lb

## 2018-01-12 VITALS — BP 122/84 | HR 93 | Temp 98.4°F | Resp 16 | Ht 64.25 in | Wt 144.8 lb

## 2018-01-12 DIAGNOSIS — Z0000 Encounter for general adult medical examination without abnormal findings: Secondary | ICD-10-CM

## 2018-01-12 DIAGNOSIS — Z23 Encounter for immunization: Secondary | ICD-10-CM | POA: Diagnosis not present

## 2018-01-12 DIAGNOSIS — E785 Hyperlipidemia, unspecified: Secondary | ICD-10-CM

## 2018-01-12 DIAGNOSIS — M810 Age-related osteoporosis without current pathological fracture: Secondary | ICD-10-CM | POA: Diagnosis not present

## 2018-01-12 MED ORDER — SIMVASTATIN 20 MG PO TABS: 20.0000 mg | ORAL_TABLET | Freq: Every evening | ORAL | 3 refills | Status: DC

## 2018-01-12 NOTE — Assessment & Plan Note (Addendum)
Continue medication.  Pending lipid panel .

## 2018-01-12 NOTE — Progress Notes (Signed)
Subjective:    Patient ID: Rachel Macias, female    DOB: 05-14-1945, 72 y.o.   MRN: [id number]  CC: [name] is a 72 y.o. female who presents today for follow up.   HPI: Feels well today. No new complaints.    'Has had good year'.   HLD- compliant with zocor.   Bipolar- compliant with medications. Follows with Eappen. Feels well on current regimen. No si/hi. Trip to [place] in 2020 which she is excited about.   Osteoporosis- due for DEXA. Had 2 dental implants this year.  On calcium, vitamin D. Declines Dexa.   Due mammogram, dexa.   Mammogram scheduled per patient.    Eappen 11/2017- on depakote, seroquel. Sees her every 3 months.   HISTORY:  Past Medical History:  Diagnosis Date  . Bipolar 1 disorder (HCC) 1995   Dr. Caryn Section [phone])  . Ex-smoker 2001   minimal  . Osteoporosis 2015   fosamax reaction, then was on boniva IV for ~1 yr  . Postmenopausal    age 89  . Vitamin D deficiency    Past Surgical History:  Procedure Laterality Date  . CESAREAN SECTION    . DEXA  09/2013   T -2.8 femur  . TUBAL LIGATION     with reversal   Family History  Problem Relation Age of Onset  . Thyroid disease Other        runs in family  . Diabetes Brother   . Bipolar disorder Mother   . Depression Mother   . Anxiety disorder Mother   . Stroke Neg Hx   . CAD Neg Hx   . Cancer Neg Hx     Allergies: Alendronate sodium and Amoxicillin Current Outpatient Medications on File Prior to Visit  Medication Sig Dispense Refill  . cholecalciferol (VITAMIN D) 1000 UNITS tablet Take 2 tablets (2,000 Units total) by mouth daily.    . divalproex (DEPAKOTE ER) 500 MG 24 hr tablet Take 2 tablets (1,000 mg total) by mouth at bedtime. 180 tablet 1  . QUEtiapine (SEROQUEL) 200 MG tablet Take 1 tablet (200 mg total) by mouth at bedtime. 90 tablet 1   No current facility-administered medications on file prior to visit.     Social History   Tobacco Use  . Smoking status: Former  Smoker    Last attempt to quit: 03/10/1999    Years since quitting: 18.8  . Smokeless tobacco: Never Used  Substance Use Topics  . Alcohol use: No  . Drug use: No    Review of Systems  Constitutional: Negative for chills and fever.  Respiratory: Negative for cough.   Cardiovascular: Negative for chest pain and palpitations.  Gastrointestinal: Negative for nausea and vomiting.  Psychiatric/Behavioral: Negative for suicidal ideas. The patient is not nervous/anxious.       Objective:    BP 122/84 (BP Location: Left Arm, Patient Position: Sitting, Cuff Size: Normal)   Pulse 93   Temp 98.4 F (36.9 C) (Oral)   Resp 16   Ht 5\' 4"  (1.626 m)   Wt 144 lb 8 oz (65.5 kg)   SpO2 97%   BMI 24.80 kg/m  BP Readings from Last 3 Encounters:  01/12/18 122/84  01/12/18 122/84  11/09/17 (!) 149/90   Wt Readings from Last 3 Encounters:  01/12/18 144 lb 8 oz (65.5 kg)  01/12/18 144 lb 12.8 oz (65.7 kg)  11/09/17 143 lb 9.6 oz (65.1 kg)    Physical Exam  Constitutional: She appears well-developed  and well-nourished.  Eyes: Conjunctivae are normal.  Cardiovascular: Normal rate, regular rhythm, normal heart sounds and normal pulses.  Pulmonary/Chest: Effort normal and breath sounds normal. She has no wheezes. She has no rhonchi. She has no rales.  Neurological: She is alert.  Skin: Skin is warm and dry.  Psychiatric: She has a normal mood and affect. Her speech is normal and behavior is normal. Thought content normal.  Vitals reviewed.      Assessment & Plan:   Problem List Items Addressed This Visit      Musculoskeletal and Integument   Osteoporosis    Declines medication treatment. Education provided regarding vitamin d, calcium. Will follow        Other   Dyslipidemia - Primary    Continue medication.  Pending lipid panel .      Relevant Medications   simvastatin (ZOCOR) 20 MG tablet    Other Visit Diagnoses    Hyperlipidemia, unspecified hyperlipidemia type        Relevant Medications   simvastatin (ZOCOR) 20 MG tablet       I am having Valetta Mulroy maintain her cholecalciferol, divalproex, QUEtiapine, and simvastatin.   Meds ordered this encounter  Medications  . simvastatin (ZOCOR) 20 MG tablet    Sig: Take 1 tablet (20 mg total) by mouth every evening.    Dispense:  90 tablet    Refill:  3    Order Specific Question:   Supervising Provider    Answer:   Sherlene Shams [[id number]]    Return precautions given.   Risks, benefits, and alternatives of the medications and treatment plan prescribed today were discussed, and patient expressed understanding.   Education regarding symptom management and diagnosis given to patient on AVS.  Continue to follow with Allegra Grana, FNP for routine health maintenance.   Stephannie Peters and I agreed with plan.   Rennie Plowman, FNP

## 2018-01-12 NOTE — Progress Notes (Signed)
Subjective:   Rachel Macias is a 72 y.o. female who presents for Medicare Annual (Subsequent) preventive examination.  Review of Systems:  No ROS.  Medicare Wellness Visit. Additional risk factors are reflected in the social history. Cardiac Risk Factors include: advanced age (>23men, >63 women)     Objective:     Vitals: BP 122/84 (BP Location: Left Arm, Patient Position: Sitting, Cuff Size: Normal)   Pulse 93   Temp 98.4 F (36.9 C) (Oral)   Resp 16   Ht 5' 4.25" (1.632 m)   Wt 144 lb 12.8 oz (65.7 kg)   SpO2 97%   BMI 24.66 kg/m   Body mass index is 24.66 kg/m.  Advanced Directives 01/12/2018 01/04/2017  Does Patient Have a Medical Advance Directive? Yes Yes  Type of Estate agent of [place];Living will Healthcare Power of Attorney  Does patient want to make changes to medical advance directive? No - Patient declined No - Patient declined  Copy of Healthcare Power of Attorney in Chart? Yes - validated most recent copy scanned in chart (See row information) No - copy requested    Tobacco Social History   Tobacco Use  Smoking Status Former Smoker  . Last attempt to quit: 03/10/1999  . Years since quitting: 18.8  Smokeless Tobacco Never Used     Counseling given: Not Answered   Clinical Intake:  Pre-visit preparation completed: Yes        Nutritional Status: BMI of 19-24  Normal Diabetes: No  How often do you need to have someone help you when you read instructions, pamphlets, or other written materials from your doctor or pharmacy?: 1 - Never  Interpreter Needed?: No     Past Medical History:  Diagnosis Date  . Bipolar 1 disorder (HCC) 1995   Dr. Caryn Section [phone])  . Ex-smoker 2001   minimal  . Osteoporosis 2015   fosamax reaction, then was on boniva IV for ~1 yr  . Postmenopausal    age 61  . Vitamin D deficiency    Past Surgical History:  Procedure Laterality Date  . CESAREAN SECTION    . DEXA  09/2013   T  -2.8 femur  . TUBAL LIGATION     with reversal   Family History  Problem Relation Age of Onset  . Thyroid disease Other        runs in family  . Diabetes Brother   . Bipolar disorder Mother   . Depression Mother   . Anxiety disorder Mother   . Stroke Neg Hx   . CAD Neg Hx   . Cancer Neg Hx    Social History   Socioeconomic History  . Marital status: Widowed    Spouse name: Not on file  . Number of children: 1  . Years of education: Not on file  . Highest education level: Associate degree: occupational, Scientist, product/process development, or vocational program  Occupational History    Comment: retired  Engineer, production  . Financial resource strain: Not hard at all  . Food insecurity:    Worry: Never true    Inability: Never true  . Transportation needs:    Medical: No    Non-medical: No  Tobacco Use  . Smoking status: Former Smoker    Last attempt to quit: 03/10/1999    Years since quitting: 18.8  . Smokeless tobacco: Never Used  Substance and Sexual Activity  . Alcohol use: No  . Drug use: No  . Sexual activity: Not Currently  Lifestyle  . Physical activity:    Days per week: 7 days    Minutes per session: 30 min  . Stress: Not at all  Relationships  . Social connections:    Talks on phone: Three times a week    Gets together: Once a week    Attends religious service: More than 4 times per year    Active member of club or organization: No    Attends meetings of clubs or organizations: Never    Relationship status: Widowed  Other Topics Concern  . Not on file  Social History Narrative   Lives alone with 2 dogs, widow   30 yrs ago son died at age 35yo (hit by car).   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 cholecalciferol (VITAMIN D) 1000 UNITS  tablet Take 2 tablets (2,000 Units total) by mouth daily.  . divalproex (DEPAKOTE ER) 500 MG 24 hr tablet Take 2 tablets (1,000 mg total) by mouth at bedtime.  Marland Kitchen QUEtiapine (SEROQUEL) 200 MG tablet Take 1 tablet (200 mg total) by mouth at bedtime.  . simvastatin (ZOCOR) 20 MG tablet Take 1 tablet (20 mg total) by mouth every evening.  . [DISCONTINUED] chlorhexidine (PERIDEX) 0.12 % solution SWISH AND EXP 10 ML PO BID  . [DISCONTINUED] traZODone (DESYREL) 50 MG tablet Take 0.5-1 tablets (25-50 mg total) by mouth at bedtime as needed for sleep.   No facility-administered encounter medications on file as of 01/12/2018.     Activities of Daily Living In your present state of health, do you have any difficulty performing the following activities: 01/12/2018  Hearing? N  Vision? N  Difficulty concentrating or making decisions? N  Walking or climbing stairs? N  Dressing or bathing? N  Doing errands, shopping? N  Preparing Food and eating ? N  Using the Toilet? N  In the past six months, have you accidently leaked urine? Y  Comment Managed with a pad at night only.  Do you have problems with loss of bowel control? N  Managing your Medications? N  Managing your Finances? N  Housekeeping or managing your Housekeeping? N  Some recent data might be hidden    Patient Care Team: Allegra Grana, FNP as PCP - General (Family Medicine)    Assessment:   This is a routine wellness examination for Rachel Macias.  The goal of the wellness visit is to assist the patient how to close the gaps in care and create a preventative care plan for the patient.   The roster of all physicians providing medical care to patient is listed in the Snapshot section of the chart.  Taking calcium VIT D as appropriate/Osteoporosis  reviewed.    Safety issues reviewed; Lives alone. Smoke and carbon monoxide detectors in the home. No firearms in the home. Wears seatbelts when driving or riding with others. No violence in  the home.  They do not have excessive sun exposure.  Discussed the need for sun protection: hats, long sleeves and the use of sunscreen if there is significant sun exposure.  Depression- PHQ 2 &9 exemption.  Seen by psychiatry every 3 months.  States, she does well as long as her medication is maintained.  Appetite is good. Sleep is  fair. No harmful thoughts towards self or others.  Socially active; makes and sells jewelry for events.  .  Patient is alert, normal appearance, oriented to person/place/and time. Correctly identified the president of the [place] and recalls of 2/3 words.Performs simple calculations and can read correct time from watch face.   No new identified risk were noted.  No failures at ADL's or IADL's.    BMI- discussed the importance of a healthy diet, water intake and the benefits of aerobic exercise.   Diet-Mediterranean  Dental- UTD.  High dose influenza vaccine administered R deltoid, tolerated well. Educational material provided.  TDAP vaccine deferred per patient preference.  Follow up with insurance.  Educational material provided.  Exercise Activities and Dietary recommendations Current Exercise Habits: Home exercise routine, Type of exercise: calisthenics(Dance ), Time (Minutes): 30, Frequency (Times/Week): 3, Weekly Exercise (Minutes/Week): 90, Intensity: Moderate  Goals    . Maintain Healthy Lifestyle     Keep all routine maintenance appointments Healthy diet Stay active       Fall Risk Fall Risk  01/12/2018 01/04/2017 05/29/2016 02/12/2016 09/14/2014  Falls in the past year? 0 No No Yes No  Number falls in past yr: - - - 1 -  Injury with Fall? - - - No -   Depression Screen PHQ 2/9 Scores 01/12/2018 01/04/2017 05/29/2016 02/12/2016  PHQ - 2 Score - - 0 0  Exception Documentation Other- indicate reason in comment box Other- indicate reason in comment box - -  Not completed Psychiatrist visits every 3 months Currently in treatment with Dr.Braden ([facility]) - -      Cognitive Function MMSE - Mini Mental State Exam 01/04/2017  Not completed: Refused     6CIT Screen 01/12/2018  What Year? 0 points  What month? 0 points  What time? 0 points  Count back from 20 0 points  Months in reverse 0 points  Repeat phrase 0 points  Total Score 0    Immunization History  Administered Date(s) Administered  . Influenza, High Dose Seasonal PF 12/12/2015, 01/04/2017, 01/12/2018  . Influenza-Unspecified 01/07/2013  . Pneumococcal Conjugate-13 08/22/2013  . Pneumococcal Polysaccharide-23 09/14/2014   Screening Tests Health Maintenance  Topic Date Due  . TETANUS/TDAP  08/28/1964  . Fecal DNA (Cologuard)  12/25/2018  . MAMMOGRAM  02/09/2019  . INFLUENZA VACCINE  Completed  . DEXA SCAN  Completed  . Hepatitis C Screening  Completed  . PNA vac Low Risk Adult  Completed      Plan:    End of life planning; Advance aging; Advanced directives discussed. Copy of current HCPOA/Living Will on file.  I have personally reviewed and noted the following in the patient's chart:    . Medical and social history . Use of alcohol, tobacco or illicit drugs  . Current medications and supplements . Functional ability and status . Nutritional status . Physical activity . Advanced directives . List of other physicians . Hospitalizations, surgeries, and ER visits in previous 12 months . Vitals . Screenings to include cognitive, depression, and falls . Referrals and appointments  In addition, I have reviewed and discussed with patient certain preventive protocols, quality metrics, and best practice recommendations. A written personalized care plan for preventive services as well as general preventive health recommendations were provided to patient.     Ashok Pall, LPN  16/03/958   Agree with plan. Rennie Plowman, NP

## 2018-01-12 NOTE — Patient Instructions (Addendum)
Once you get labs done, particularly the lipid panel, send me a mychart message.   Pleasure seeing you today  For post menopausal women, guidelines recommend a diet with 1200 mg of Calcium per day. If you are eating calcium rich foods, you do not need a calcium supplement. The body better absorbs the calcium that you eat over supplementation. If you do supplement, I recommend not supplementing the full 1200 mg/ day as this can lead to increased risk of cardiovascular disease. I recommend Calcium Citrate over the counter, and you may take a total of 600 to 800 mg per day in divided doses with meals for best absorption.   For bone health, you need adequate vitamin D, and I recommend you supplement as it is harder to do so with diet alone. I recommend cholecalciferol 800 units daily.  Also, please ensure you are following a diet high in calcium -- research shows better outcomes with dietary sources including kale, yogurt, broccolii, cheese, okra, almonds- to name a few.    Also remember that exercise is a great medicine for maintain and preserve bone health. Advise moderate exercise for 30 minutes , 3 times per week.

## 2018-01-12 NOTE — Assessment & Plan Note (Signed)
Declines medication treatment. Education provided regarding vitamin d, calcium. Will follow

## 2018-01-12 NOTE — Patient Instructions (Addendum)
  Rachel Macias , Thank you for taking time to come for your Medicare Wellness Visit. I appreciate your ongoing commitment to your health goals. Please review the following plan we discussed and let me know if I can assist you in the future.   These are the goals we discussed: Goals    . Maintain Healthy Lifestyle     Keep all routine maintenance appointments Healthy diet Stay active       This is a list of the screening recommended for you and due dates:  Health Maintenance  Topic Date Due  . Tetanus Vaccine  08/28/1964  . Cologuard (Stool DNA test)  12/25/2018  . Mammogram  02/09/2019  . Flu Shot  Completed  . DEXA scan (bone density measurement)  Completed  .  Hepatitis C: One time screening is recommended by Center for Disease Control  (CDC) for  adults born from [birth year] through 1965.   Completed  . Pneumonia vaccines  Completed

## 2018-01-13 ENCOUNTER — Other Ambulatory Visit: Payer: Self-pay | Admitting: Psychiatry

## 2018-01-13 DIAGNOSIS — F3110 Bipolar disorder, current episode manic without psychotic features, unspecified: Secondary | ICD-10-CM | POA: Diagnosis not present

## 2018-01-14 ENCOUNTER — Other Ambulatory Visit: Payer: Self-pay | Admitting: Psychiatry

## 2018-01-14 LAB — COMPREHENSIVE METABOLIC PANEL
ALBUMIN: 4.4 g/dL (ref 3.5–4.8)
ALK PHOS: 57 IU/L (ref 39–117)
ALT: 11 IU/L (ref 0–32)
AST: 13 IU/L (ref 0–40)
Albumin/Globulin Ratio: 1.6 (ref 1.2–2.2)
BUN / CREAT RATIO: 20 (ref 12–28)
BUN: 19 mg/dL (ref 8–27)
Bilirubin Total: 0.3 mg/dL (ref 0.0–1.2)
CALCIUM: 9.5 mg/dL (ref 8.7–10.3)
CO2: 23 mmol/L (ref 20–29)
CREATININE: 0.96 mg/dL (ref 0.57–1.00)
Chloride: 104 mmol/L (ref 96–106)
GFR calc Af Amer: 68 mL/min/{1.73_m2} (ref >59)
GFR calc non Af Amer: 59 mL/min/{1.73_m2} — ABNORMAL LOW (ref >59)
GLUCOSE: 88 mg/dL (ref 65–99)
Globulin, Total: 2.7 g/dL (ref 1.5–4.5)
Potassium: 4.8 mmol/L (ref 3.5–5.2)
Sodium: 143 mmol/L (ref 134–144)
Total Protein: 7.1 g/dL (ref 6.0–8.5)

## 2018-01-14 LAB — CBC WITH DIFFERENTIAL/PLATELET
BASOS ABS: 0 10*3/uL (ref 0.0–0.2)
Basos: 1 %
EOS (ABSOLUTE): 0.2 10*3/uL (ref 0.0–0.4)
EOS: 5 %
HEMATOCRIT: 38.7 % (ref 34.0–46.6)
HEMOGLOBIN: 13.1 g/dL (ref 11.1–15.9)
IMMATURE GRANS (ABS): 0 10*3/uL (ref 0.0–0.1)
IMMATURE GRANULOCYTES: 0 %
LYMPHS ABS: 1.2 10*3/uL (ref 0.7–3.1)
LYMPHS: 27 %
MCH: 32.3 pg (ref 26.6–33.0)
MCHC: 33.9 g/dL (ref 31.5–35.7)
MCV: 95 fL (ref 79–97)
MONOCYTES: 11 %
Monocytes Absolute: 0.5 10*3/uL (ref 0.1–0.9)
NEUTROS PCT: 56 %
Neutrophils Absolute: 2.4 10*3/uL (ref 1.4–7.0)
Platelets: 229 10*3/uL (ref 150–450)
RBC: 4.06 x10E6/uL (ref 3.77–5.28)
RDW: 12 % — ABNORMAL LOW (ref 12.3–15.4)
WBC: 4.3 10*3/uL (ref 3.4–10.8)

## 2018-01-14 LAB — LIPID PANEL W/O CHOL/HDL RATIO
CHOLESTEROL TOTAL: 144 mg/dL (ref 100–199)
HDL: 49 mg/dL (ref >39)
LDL CALC: 67 mg/dL (ref 0–99)
TRIGLYCERIDES: 142 mg/dL (ref 0–149)
VLDL CHOLESTEROL CAL: 28 mg/dL (ref 5–40)

## 2018-01-14 LAB — PROLACTIN: Prolactin: 11.7 ng/mL (ref 4.8–23.3)

## 2018-01-14 LAB — HGB A1C W/O EAG: Hgb A1c MFr Bld: 5 % (ref 4.8–5.6)

## 2018-01-14 LAB — VALPROIC ACID LEVEL: Valproic Acid Lvl: 78 ug/mL (ref 50–100)

## 2018-01-24 ENCOUNTER — Other Ambulatory Visit: Payer: Self-pay | Admitting: Family

## 2018-01-24 ENCOUNTER — Encounter: Payer: Self-pay | Admitting: Family

## 2018-01-24 ENCOUNTER — Other Ambulatory Visit: Payer: Self-pay

## 2018-01-24 ENCOUNTER — Telehealth: Payer: Self-pay | Admitting: *Deleted

## 2018-01-24 ENCOUNTER — Telehealth: Payer: Self-pay | Admitting: Psychiatry

## 2018-01-24 ENCOUNTER — Telehealth: Payer: Self-pay

## 2018-01-24 DIAGNOSIS — Z1231 Encounter for screening mammogram for malignant neoplasm of breast: Secondary | ICD-10-CM

## 2018-01-24 DIAGNOSIS — M810 Age-related osteoporosis without current pathological fracture: Secondary | ICD-10-CM

## 2018-01-24 NOTE — Telephone Encounter (Signed)
pt called left message on voice mail that she wanted her labwork resutls.

## 2018-01-24 NOTE — Telephone Encounter (Signed)
Will ask Epimenio FootShekinah CMA who is covering to send a copy to her

## 2018-01-24 NOTE — Telephone Encounter (Signed)
Called patient and discussed lab results, patient said that at this time she doesn't want a copy of her labs she can look on my chart if she wants to see the results, but she was pleased with the call. Patient said that if she has any questions she will discuss on Dec 3rd at appointment

## 2018-01-24 NOTE — Telephone Encounter (Signed)
-----   Message from Allegra GranaMargaret G Arnett, FNP sent at 01/24/2018  4:21 PM EST ----- Can this patient do prolia?  She is pending more UTD dexa  ( last 2017)

## 2018-01-24 NOTE — Telephone Encounter (Signed)
Patient wants her Lab reports. Reviewed labs - 01/13/2018 - depakote level, hba1c, lipid panel, cmp - all with normal limits. Will ask CMA to send copy  to her.

## 2018-01-25 NOTE — Telephone Encounter (Signed)
See previous telephone message  

## 2018-02-02 NOTE — Telephone Encounter (Signed)
Patient has been approved for Prolia can schedule , cost is 20% of admin fee if deductible met patient cost out of pocket  $ 0 . OK to schedule.

## 2018-02-08 ENCOUNTER — Other Ambulatory Visit: Payer: Self-pay

## 2018-02-08 ENCOUNTER — Encounter: Payer: Self-pay | Admitting: Family

## 2018-02-08 ENCOUNTER — Ambulatory Visit: Payer: Medicare Other | Admitting: Psychiatry

## 2018-02-08 ENCOUNTER — Ambulatory Visit (INDEPENDENT_AMBULATORY_CARE_PROVIDER_SITE_OTHER): Payer: Medicare Other | Admitting: Psychiatry

## 2018-02-08 ENCOUNTER — Encounter: Payer: Self-pay | Admitting: Psychiatry

## 2018-02-08 VITALS — BP 134/84 | HR 98 | Temp 97.9°F | Wt 145.4 lb

## 2018-02-08 DIAGNOSIS — G4701 Insomnia due to medical condition: Secondary | ICD-10-CM

## 2018-02-08 DIAGNOSIS — F3173 Bipolar disorder, in partial remission, most recent episode manic: Secondary | ICD-10-CM | POA: Diagnosis not present

## 2018-02-08 NOTE — Progress Notes (Signed)
BH MD OP Progress Note  02/08/2018 2:20 PM Rachel Macias  MRN:  [id number]  Chief Complaint: ' I am here for follow up.' Chief Complaint    Follow-up; Medication Refill     HPI: Rachel Macias is a 72 year old Caucasian female, widowed, lives in [place], has a history of bipolar disorder, presented to the clinic today for a follow-up visit.  Patient today reports she has been compliant with her medications as prescribed.  She reports her mood symptoms are stable.  She reports she continues to sleep good.  Patient denies any significant anxiety symptoms.  She reports she spent her Thanksgiving holidays with her family.  She has been working on her jewelry making hobby and has several jewelries which were accepted at the craft show at [employer].  She reports she is anxious as well as excited about it.  Patient looks forward to the vacation she is going to take to [place] in March.  Patient denies any suicidality.  She denies any perceptual disturbances.  Discussed lab reports dated 01/13/2018-Depakote level came back therapeutic.  She denies any other concerns today.  Visit Diagnosis:    ICD-10-CM   1. Bipolar disorder, in partial remission, most recent episode manic (HCC) F31.73   2. Insomnia due to medical condition G47.01    improved    Past Psychiatric History: Reviewed past psychiatric history from my progress note on 06/10/2017.  Past trials of Prozac, lithium, Zyprexa  Past Medical History:  Past Medical History:  Diagnosis Date  . Bipolar 1 disorder (HCC) 1995   Dr. Caryn Section [phone])  . Ex-smoker 2001   minimal  . Osteoporosis 2015   fosamax reaction, then was on boniva IV for ~1 yr  . Postmenopausal    age 89  . Vitamin D deficiency     Past Surgical History:  Procedure Laterality Date  . CESAREAN SECTION    . DEXA  09/2013   T -2.8 femur  . TUBAL LIGATION     with reversal    Family Psychiatric History: I have reviewed family psychiatric history from my  progress note on 06/10/2017  Family History:  Family History  Problem Relation Age of Onset  . Thyroid disease Other        runs in family  . Diabetes Brother   . Bipolar disorder Mother   . Depression Mother   . Anxiety disorder Mother   . Stroke Neg Hx   . CAD Neg Hx   . Cancer Neg Hx     Social History: I have reviewed social history from my progress note on 06/10/2017. Social History   Socioeconomic History  . Marital status: Widowed    Spouse name: Not on file  . Number of children: 1  . Years of education: Not on file  . Highest education level: Associate degree: occupational, Scientist, product/process development, or vocational program  Occupational History    Comment: retired  Engineer, production  . Financial resource strain: Not hard at all  . Food insecurity:    Worry: Never true    Inability: Never true  . Transportation needs:    Medical: No    Non-medical: No  Tobacco Use  . Smoking status: Former Smoker    Last attempt to quit: 03/10/1999    Years since quitting: 18.9  . Smokeless tobacco: Never Used  Substance and Sexual Activity  . Alcohol use: No  . Drug use: No  . Sexual activity: Not Currently  Lifestyle  . Physical activity:  Days per week: 7 days    Minutes per session: 30 min  . Stress: Not at all  Relationships  . Social connections:    Talks on phone: Three times a week    Gets together: Once a week    Attends religious service: More than 4 times per year    Active member of club or organization: No    Attends meetings of clubs or organizations: Never    Relationship status: Widowed  Other Topics Concern  . Not on file  Social History Narrative   Lives alone with 2 dogs, widow   30 yrs ago son died at age 90yo (hit by car).   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 Alendronate Sodium Other (See Comments)    GI distress GI distress  . Amoxicillin Hives    Metabolic Disorder Labs: Lab Results  Component Value Date   HGBA1C 5.0 01/13/2018   Lab Results  Component Value Date   PROLACTIN 11.7 01/13/2018   PROLACTIN 10.8 04/16/2017   Lab Results  Component Value Date   CHOL 144 01/13/2018   TRIG 142 01/13/2018   HDL 49 01/13/2018   CHOLHDL 7 12/24/2015   VLDL 47.2 (H) 12/24/2015   LDLCALC 67 01/13/2018   LDLCALC 202 (H) 08/15/2013   Lab Results  Component Value Date   TSH 3.30 12/24/2015   TSH 2.93 10/12/2014    Therapeutic Level Labs: Lab Results  Component Value Date   LITHIUM 1.40 (H) 08/15/2016   LITHIUM 2.10 (HH) 08/15/2016   Lab Results  Component Value Date   VALPROATE 78 01/13/2018   VALPROATE 69 04/16/2017   No components found for:  CBMZ  Current Medications: Current Outpatient Medications  Medication Sig Dispense Refill  . cholecalciferol (VITAMIN D) 1000 UNITS tablet Take 2 tablets (2,000 Units total) by mouth daily.    . divalproex (DEPAKOTE ER) 500 MG 24 hr tablet Take 2 tablets (1,000 mg total) by mouth at bedtime. 180 tablet 1  . QUEtiapine (SEROQUEL) 200 MG tablet Take 1 tablet (200 mg total) by mouth at bedtime. 90 tablet 1  . simvastatin (ZOCOR) 20 MG tablet Take 1 tablet (20 mg total) by mouth every evening. 90 tablet 3  . traZODone (DESYREL) 50 MG tablet TAKE 1/2 TO 1 TABLET BY MOUTH AT BEDTIME AS NEEDED FOR SLEEP. 30 tablet 0   No current facility-administered medications for this visit.      Musculoskeletal: Strength & Muscle Tone: within normal limits Gait & Station: normal Patient leans: N/A  Psychiatric Specialty Exam: Review of Systems  Psychiatric/Behavioral: Negative for depression. The patient is not nervous/anxious.   All other systems reviewed and are negative.   Blood pressure 134/84, pulse 98, temperature 97.9 F (36.6 C), temperature source Oral, weight 145 lb 6.4 oz  (66 kg).Body mass index is 24.96 kg/m.  General Appearance: Casual  Eye Contact:  Fair  Speech:  Clear and Coherent  Volume:  Normal  Mood:  Euthymic  Affect:  Congruent  Thought Process:  Goal Directed and Descriptions of Associations: Intact  Orientation:  Full (Time, Place, and Person)  Thought Content: Logical   Suicidal Thoughts:  No  Homicidal Thoughts:  No  Memory:  Immediate;   Fair Recent;  Fair Remote;   Fair  Judgement:  Fair  Insight:  Fair  Psychomotor Activity:  Normal  Concentration:  Concentration: Fair and Attention Span: Fair  Recall:  [employer]Fair  Fund of Knowledge: Fair  Language: Fair  Akathisia:  No  Handed:  Right  AIMS (if indicated): 0  Assets:  Communication Skills Desire for Improvement Social Support Talents/Skills  ADL's:  Intact  Cognition: WNL  Sleep:  Fair   Screenings: PHQ2-9     [facility]  PHQ-2 Total Score  0  0  0  0       Assessment and Plan: Rachel Macias is a 72 year old Caucasian female who has a history of bipolar disorder, history of lithium toxicity, presented to the clinic today for a follow-up visit.  Patient reports she is currently doing well on the current medication regimen.  She continues to have good social support system.  Discussed plan as noted below.  Plan Bipolar disorder Continue Depakote ER 1000 mg p.o. nightly Depakote level on 01/13/2018-78-therapeutic. I have reviewed the other labs dated 01/13/2018-prolactin-11.7, hemoglobin A1c-5, lipid panel-01/13/2018-within normal limits. All her labs came back within normal limits.  She will continue to follow-up with her PMD. Continue Seroquel 200 mg p.o. nightly Trazodone 25-50 mg p.o. nightly as needed  Continue leisure activities,  meditation, exercise.  Follow-up in clinic in 3 months or sooner if needed.  More than 50 % of the time was spent for psychoeducation and supportive psychotherapy and care coordination.  This note was generated in part or whole with voice recognition software. Voice recognition is usually quite accurate but there are transcription errors that can and very often do occur. I apologize for any typographical errors that were not detected and corrected.        Jomarie LongsSaramma Rynn Markiewicz, MD 02/08/2018, 2:20 PM

## 2018-02-08 NOTE — Telephone Encounter (Signed)
Rachel Macias  What is status of bone density?  For my own knowledge, can [facility] the sites?

## 2018-02-10 NOTE — Telephone Encounter (Signed)
Yes ma'am they can. [facility].

## 2018-02-11 NOTE — Telephone Encounter (Signed)
Rachel Macias,  Would you schedule her bone density for patient on same day as mammogram?  please let pt know

## 2018-02-14 NOTE — Telephone Encounter (Signed)
I was able to schedule patient 12/17 & patient has been notified.

## 2018-02-16 ENCOUNTER — Ambulatory Visit
Admission: RE | Admit: 2018-02-16 | Discharge: 2018-02-16 | Disposition: A | Discharge status: Home / Self Care | Payer: Medicare Other | Source: Ambulatory Visit | Attending: Family | Admitting: Family

## 2018-02-16 DIAGNOSIS — Z1231 Encounter for screening mammogram for malignant neoplasm of breast: Secondary | ICD-10-CM | POA: Insufficient documentation

## 2018-02-16 IMAGING — MG DIGITAL SCREENING BILATERAL MAMMOGRAM WITH TOMO AND CAD
8 series · 8 of 24 positions shown · non-contrast
Comparison: Previous exam(s).

CLINICAL DATA: Screening.

EXAM:
DIGITAL SCREENING BILATERAL MAMMOGRAM WITH TOMO AND CAD

[L MLO synth-2D]
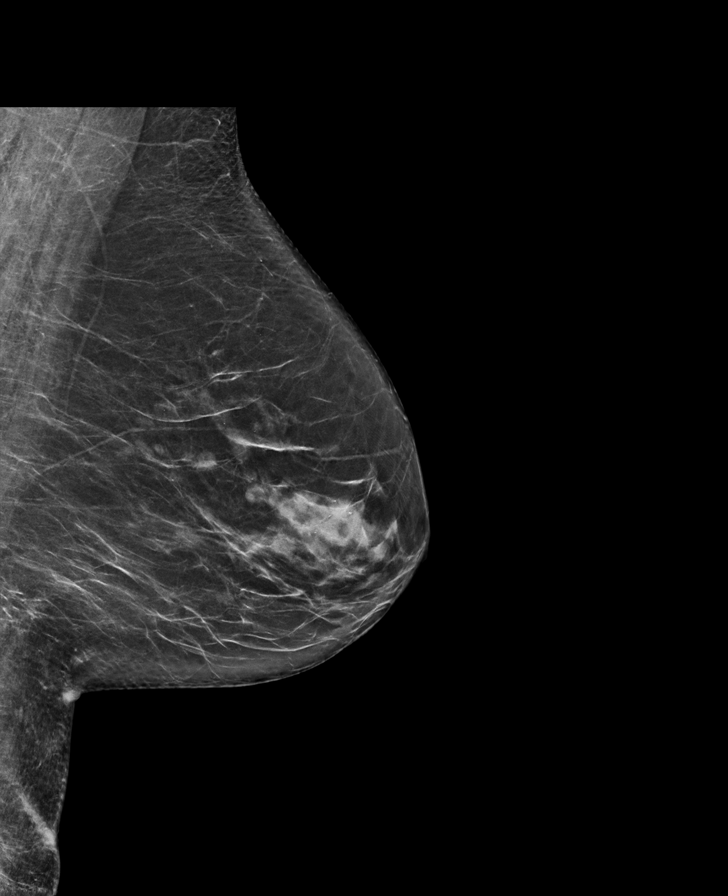

[R CC synth-2D]
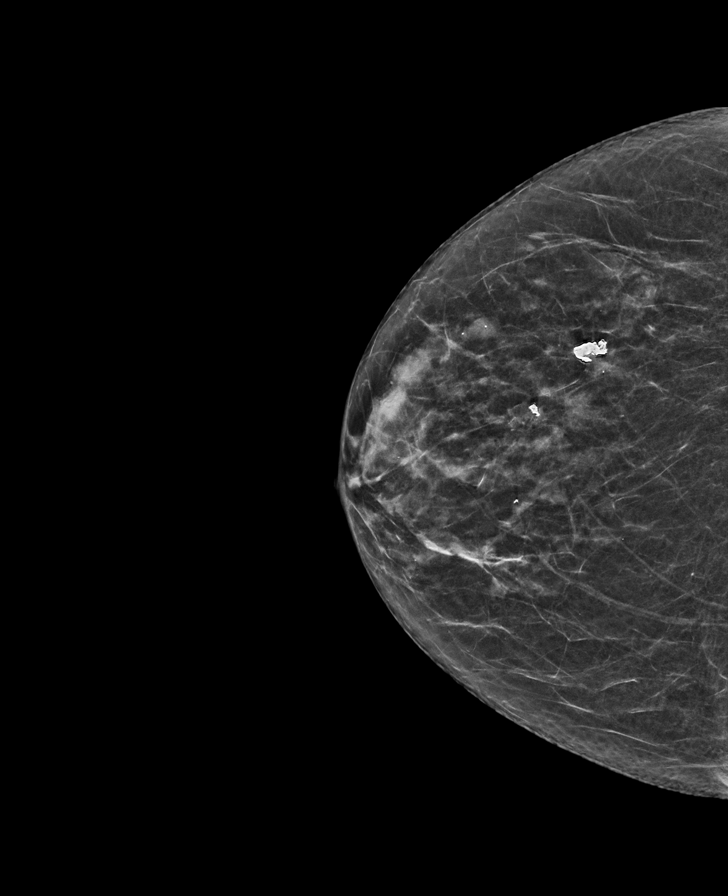

[L CC synth-2D]
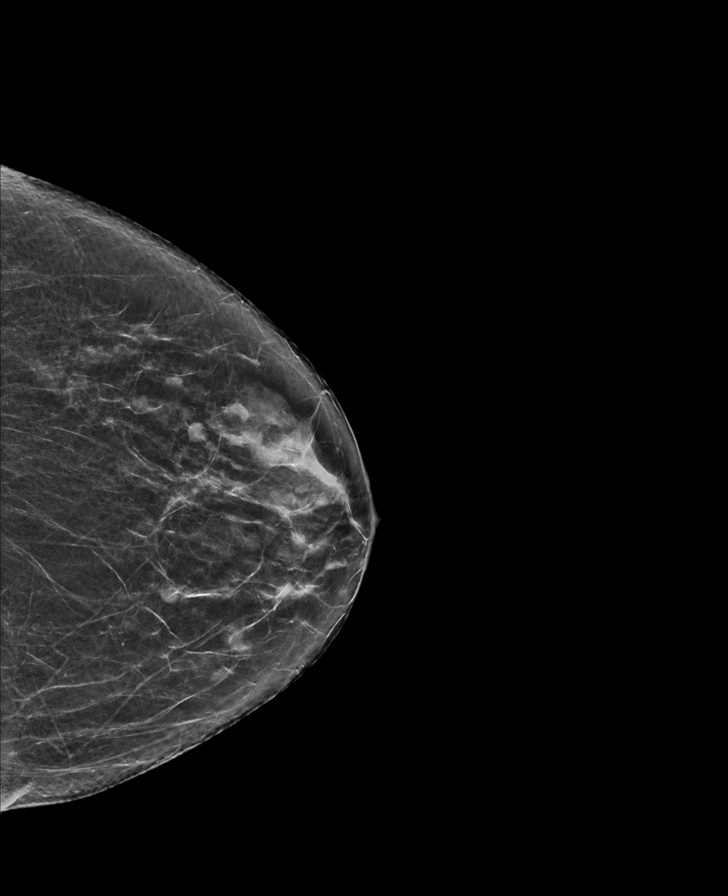

[R MLO synth-2D]
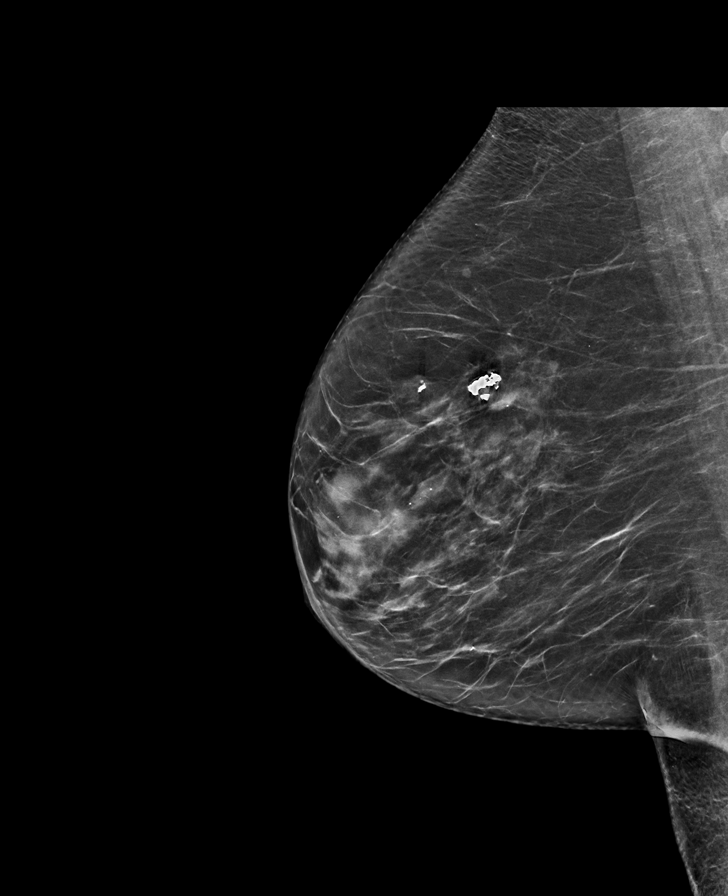

[L MLO tomo · tomo slice 36/71.0]
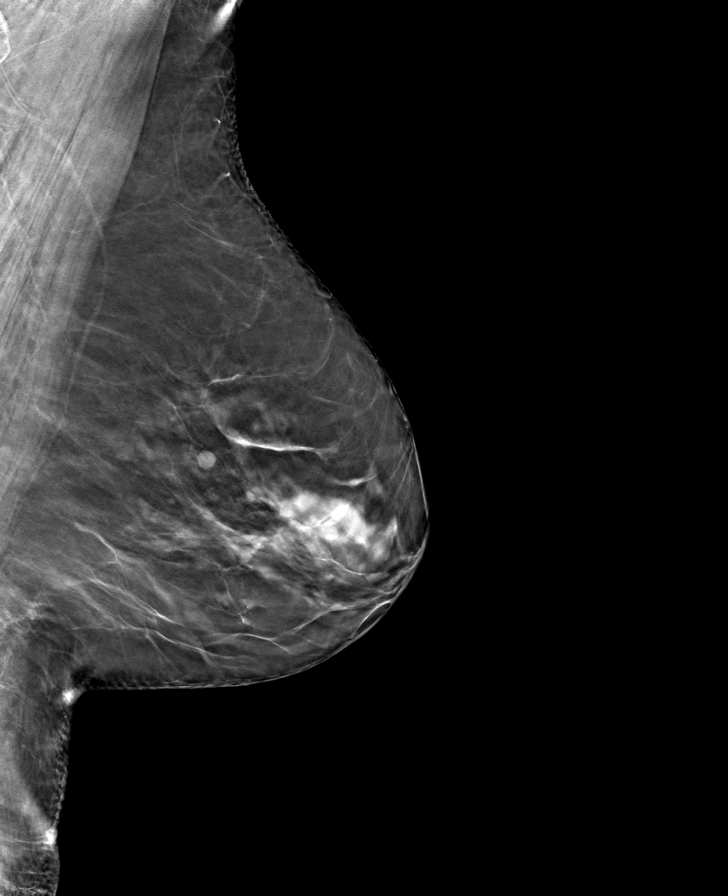

[R MLO tomo · tomo slice 37/72.0]
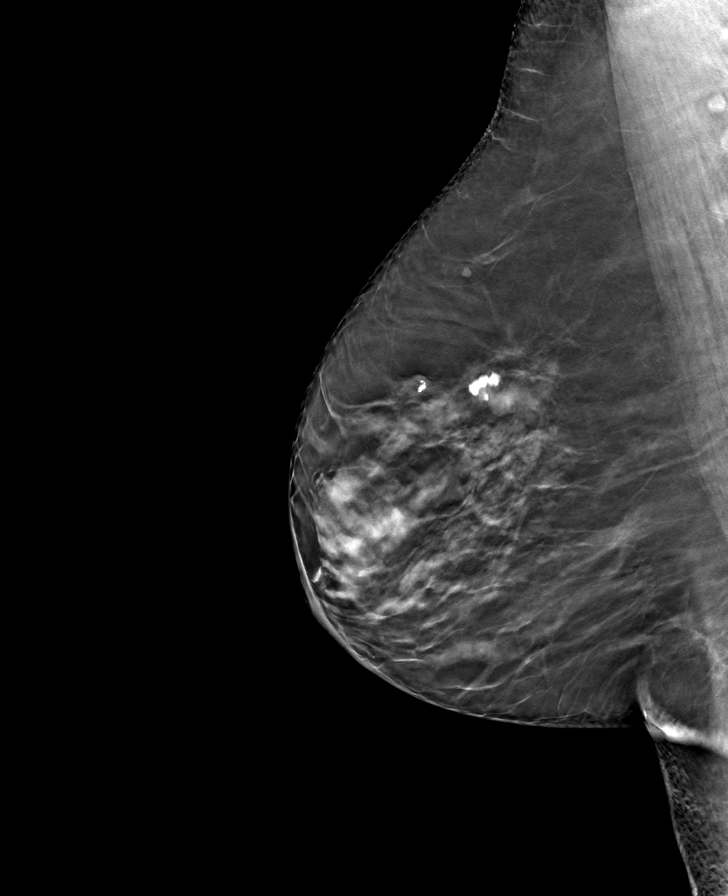

[L CC tomo · tomo slice 31/62.0]
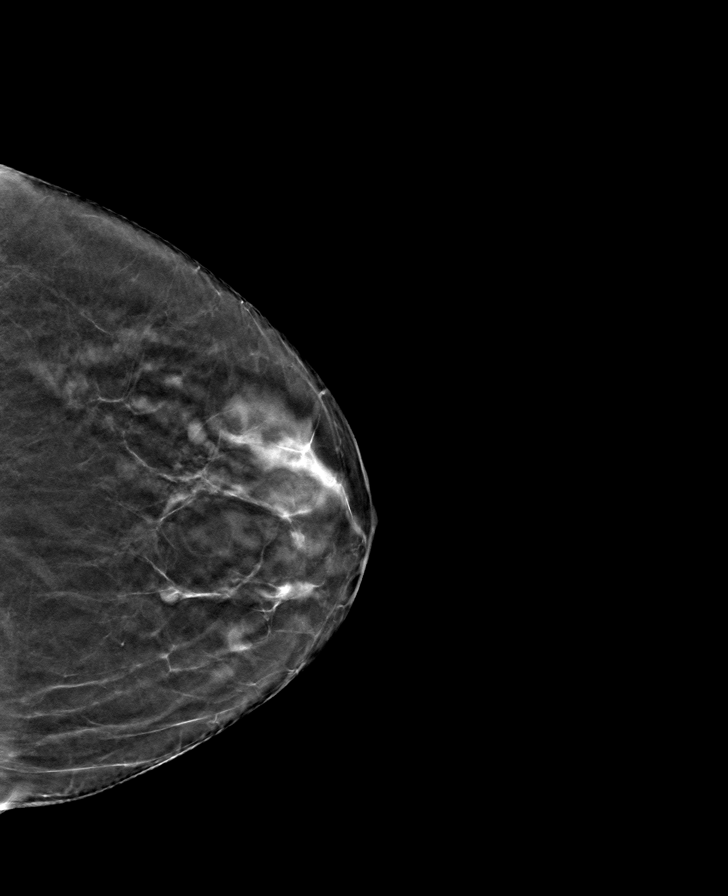

[R CC tomo · tomo slice 29/56.0]
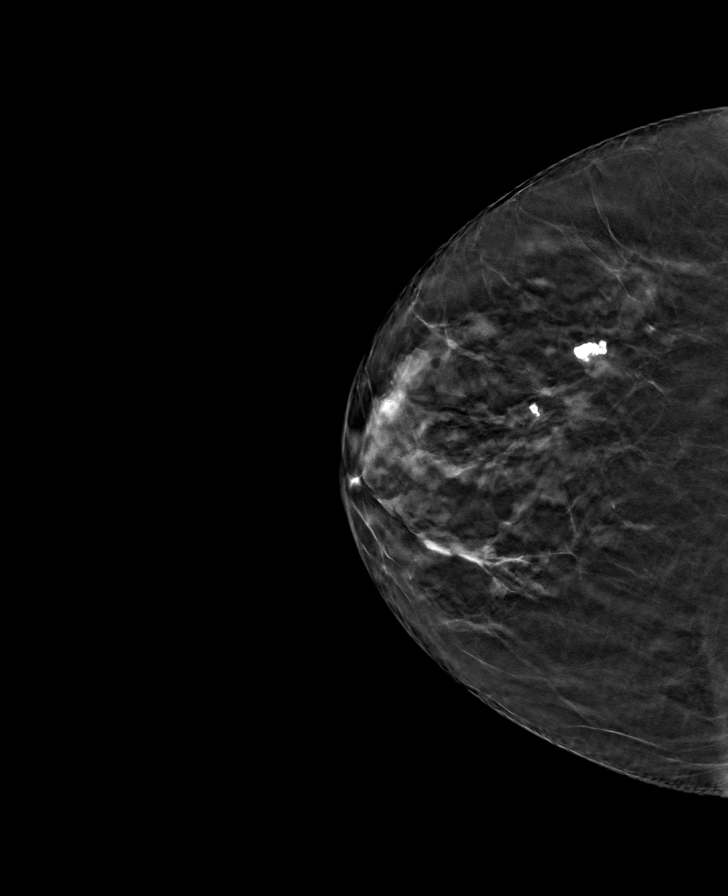

[8 of 24 positions shown; findings below may reference images not displayed]

ACR Breast Density Category b: There are scattered areas of
fibroglandular density.
FINDINGS: There are no findings suspicious for malignancy. Images were
processed with CAD.
IMPRESSION: No mammographic evidence of malignancy. A result letter of this
screening mammogram will be mailed directly to the patient.

RECOMMENDATION:
Screening mammogram in one year. (Code:[id number])

BI-RADS CATEGORY  1: Negative.

## 2018-02-22 ENCOUNTER — Ambulatory Visit
Admission: RE | Admit: 2018-02-22 | Discharge: 2018-02-22 | Disposition: A | Discharge status: Home / Self Care | Payer: Medicare Other | Source: Ambulatory Visit | Attending: Family | Admitting: Family

## 2018-02-22 DIAGNOSIS — M8588 Other specified disorders of bone density and structure, other site: Secondary | ICD-10-CM | POA: Diagnosis not present

## 2018-02-22 DIAGNOSIS — M810 Age-related osteoporosis without current pathological fracture: Secondary | ICD-10-CM | POA: Diagnosis not present

## 2018-03-11 ENCOUNTER — Telehealth: Payer: Self-pay

## 2018-03-11 MED ORDER — TRAZODONE HCL 50 MG PO TABS: 25.0000 mg | ORAL_TABLET | Freq: Every evening | ORAL | 1 refills | Status: DC | PRN

## 2018-03-11 NOTE — Telephone Encounter (Signed)
[facility]

## 2018-03-11 NOTE — Telephone Encounter (Signed)
   traZODone (DESYREL) 50 MG tablet  Medication  Date: 01/14/2018 Department: [facility] Ordering/Authorizing: Jomarie Longs, MD  Order Providers   Prescribing Provider Encounter Provider  Jomarie Longs, MD Jomarie Longs, MD  Outpatient Medication Detail    Disp Refills Start End   traZODone (DESYREL) 50 MG tablet 30 tablet 0 01/14/2018    Sig: TAKE 1/2 TO 1 TABLET BY MOUTH AT BEDTIME AS NEEDED FOR SLEEP.   Sent to pharmacy as: traZODone (DESYREL) 50 MG tablet   E-Prescribing Status: Receipt confirmed by pharmacy (01/14/2018 11:54 AM EST)

## 2018-03-17 ENCOUNTER — Encounter: Payer: Self-pay | Admitting: Family

## 2018-03-18 ENCOUNTER — Encounter: Payer: Self-pay | Admitting: Family

## 2018-03-18 ENCOUNTER — Other Ambulatory Visit: Payer: Self-pay | Admitting: Family

## 2018-05-11 ENCOUNTER — Ambulatory Visit: Payer: Medicare Other | Admitting: Psychiatry

## 2018-05-17 ENCOUNTER — Encounter: Payer: Self-pay | Admitting: Psychiatry

## 2018-05-17 ENCOUNTER — Other Ambulatory Visit: Payer: Self-pay

## 2018-05-17 ENCOUNTER — Ambulatory Visit (INDEPENDENT_AMBULATORY_CARE_PROVIDER_SITE_OTHER): Payer: Medicare Other | Admitting: Psychiatry

## 2018-05-17 VITALS — BP 118/82 | HR 77 | Temp 97.8°F | Wt 145.2 lb

## 2018-05-17 DIAGNOSIS — G4701 Insomnia due to medical condition: Secondary | ICD-10-CM

## 2018-05-17 DIAGNOSIS — F3110 Bipolar disorder, current episode manic without psychotic features, unspecified: Secondary | ICD-10-CM | POA: Diagnosis not present

## 2018-05-17 MED ORDER — DIVALPROEX SODIUM ER 500 MG PO TB24: 1000.0000 mg | ORAL_TABLET | Freq: Every day | ORAL | 1 refills | Status: DC

## 2018-05-17 MED ORDER — QUETIAPINE FUMARATE 200 MG PO TABS: 200.0000 mg | ORAL_TABLET | Freq: Every day | ORAL | 1 refills | Status: DC

## 2018-05-17 NOTE — Progress Notes (Signed)
BH MD OP Progress Note  05/17/2018 5:29 PM Rachel Macias  MRN:  [id number]  Chief Complaint: ' I am here for follow up." Chief Complaint    Follow-up; Medication Refill     HPI: Rachel Macias is a 73 year old Caucasian female, widowed, lives in [place], has a history of bipolar disorder, presented to clinic today for a follow-up visit.  Patient today reports she is making progress on the current medication regimen.  She is compliant with her Depakote and Seroquel as prescribed.  Patient reports sleep is good.  She is not on trazodone anymore.  She reports she had to cancel her Rachel Macias trip due to the coronavirus scare.  She reports she is okay with that since she wants to make sure she is safe and is not affected.  Patient denies any suicidality, homicidality or perceptual disturbances.  Patient denies any other concerns today.  Visit Diagnosis:    ICD-10-CM   1. Bipolar I disorder, most recent episode (or current) manic (HCC) F31.10 divalproex (DEPAKOTE ER) 500 MG 24 hr tablet    QUEtiapine (SEROQUEL) 200 MG tablet   in early remission  2. Insomnia due to medical condition G47.01    improved    Past Psychiatric History: I have reviewed past psychiatric history from my progress note on 06/10/2017.  Past trials of Prozac, lithium, Zyprexa  Past Medical History:  Past Medical History:  Diagnosis Date  . Bipolar 1 disorder (HCC) 1995   Dr. Caryn Section [phone])  . Ex-smoker 2001   minimal  . Osteoporosis 2015   fosamax reaction, then was on boniva IV for ~1 yr  . Postmenopausal    age 37  . Vitamin D deficiency     Past Surgical History:  Procedure Laterality Date  . CESAREAN SECTION    . DEXA  09/2013   T -2.8 femur  . TUBAL LIGATION     with reversal    Family Psychiatric History: I have reviewed family psychiatric history from my progress note on 06/10/2017.  Family History:  Family History  Problem Relation Age of Onset  . Thyroid disease Other        runs in family   . Diabetes Brother   . Bipolar disorder Mother   . Depression Mother   . Anxiety disorder Mother   . Stroke Neg Hx   . CAD Neg Hx   . Cancer Neg Hx     Social History: Reviewed social history from my progress note on 06/10/2017. Social History   Socioeconomic History  . Marital status: Widowed    Spouse name: Not on file  . Number of children: 1  . Years of education: Not on file  . Highest education level: Associate degree: occupational, Scientist, product/process development, or vocational program  Occupational History    Comment: retired  Engineer, production  . Financial resource strain: Not hard at all  . Food insecurity:    Worry: Never true    Inability: Never true  . Transportation needs:    Medical: No    Non-medical: No  Tobacco Use  . Smoking status: Former Smoker    Last attempt to quit: 03/10/1999    Years since quitting: 19.2  . Smokeless tobacco: Never Used  Substance and Sexual Activity  . Alcohol use: No  . Drug use: No  . Sexual activity: Not Currently  Lifestyle  . Physical activity:    Days per week: 7 days    Minutes per session: 30 min  . Stress: Not at  all  Relationships  . Social connections:    Talks on phone: Three times a week    Gets together: Once a week    Attends religious service: More than 4 times per year    Active member of club or organization: No    Attends meetings of clubs or organizations: Never    Relationship status: Widowed  Other Topics Concern  . Not on file  Social History Narrative   Lives alone with 2 dogs, widow   30 yrs ago son died at age 89yo (hit by car).   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 Alendronate Sodium Other (See Comments)    GI distress GI distress  . Amoxicillin Hives    Metabolic Disorder Labs: Lab Results   Component Value Date   HGBA1C 5.0 01/13/2018   Lab Results  Component Value Date   PROLACTIN 11.7 01/13/2018   PROLACTIN 10.8 04/16/2017   Lab Results  Component Value Date   CHOL 144 01/13/2018   TRIG 142 01/13/2018   HDL 49 01/13/2018   CHOLHDL 7 12/24/2015   VLDL 47.2 (H) 12/24/2015   LDLCALC 67 01/13/2018   LDLCALC 202 (H) 08/15/2013   Lab Results  Component Value Date   TSH 3.30 12/24/2015   TSH 2.93 10/12/2014    Therapeutic Level Labs: Lab Results  Component Value Date   LITHIUM 1.40 (H) 08/15/2016   LITHIUM 2.10 (HH) 08/15/2016   Lab Results  Component Value Date   VALPROATE 78 01/13/2018   VALPROATE 69 04/16/2017   No components found for:  CBMZ  Current Medications: Current Outpatient Medications  Medication Sig Dispense Refill  . cholecalciferol (VITAMIN D) 1000 UNITS tablet Take 2 tablets (2,000 Units total) by mouth daily.    . divalproex (DEPAKOTE ER) 500 MG 24 hr tablet Take 2 tablets (1,000 mg total) by mouth at bedtime. 180 tablet 1  . QUEtiapine (SEROQUEL) 200 MG tablet Take 1 tablet (200 mg total) by mouth at bedtime. 90 tablet 1  . simvastatin (ZOCOR) 20 MG tablet Take 1 tablet (20 mg total) by mouth every evening. 90 tablet 3   No current facility-administered medications for this visit.      Musculoskeletal: Strength & Muscle Tone: within normal limits Gait & Station: normal Patient leans: N/A  Psychiatric Specialty Exam: Review of Systems  All other systems reviewed and are negative.   Blood pressure 118/82, pulse 77, temperature 97.8 F (36.6 C), temperature source Oral, weight 145 lb 3.2 oz (65.9 kg).Body mass index is 24.92 kg/m.  General Appearance: Casual  Eye Contact:  Fair  Speech:  Clear and Coherent  Volume:  Normal  Mood:  Euthymic  Affect:  Appropriate  Thought Process:  Goal Directed and Descriptions of Associations: Intact  Orientation:  Full (Time, Place, and Person)  Thought Content: Logical   Suicidal  Thoughts:  No  Homicidal Thoughts:  No  Memory:  Immediate;   Fair Recent;   Fair Remote;   Fair  Judgement:  Fair  Insight:  Fair  Psychomotor Activity:  Normal  Concentration:  Concentration: Fair and Attention Span: Fair  Recall:  [employer] of Knowledge: Fair  Language: Fair  Akathisia:  No  Handed:  Right  AIMS (if  indicated): denies tremors,rigidity  Assets:  Communication Skills Desire for Improvement Social Support  ADL's:  Intact  Cognition: WNL  Sleep:  Fair   Screenings: PHQ2-9     Office Visit from 05/29/2016 in [facility] Office Visit from 02/12/2016 in [facility] Visit from 09/14/2014 in [facility] Visit from 08/22/2013 in [facility] Total Score  0  0  0  0       Assessment and Plan: Tija is a 73 yr old Caucasian female who has a history of bipolar disorder, history of lithium toxicity, presented to clinic today for a follow-up visit.  Patient is currently making progress on the current medication regimen.  Will continue plan as noted below.  Plan Bipolar disorder-stable Depakote ER 1000 mg p.o. nightly Depakote level on 01/14/2019 19-78-therapeutic. Continue Seroquel 200 mg p.o. nightly.  For insomnia-stable Continue Seroquel which helps. Discontinue trazodone for noncompliance.    Follow-up in clinic in 3 to 4 months or sooner if needed.  I have spent atleast 15 minutes face to face with patient today. More than 50 % of the time was spent for psychoeducation and supportive psychotherapy and care coordination.  This note was generated in part or whole with voice recognition software. Voice recognition is usually quite accurate but there are transcription errors that can and very often do occur. I apologize for any typographical errors that were not detected and corrected.       Jomarie Longs, MD 05/17/2018, 5:29 PM

## 2018-06-27 ENCOUNTER — Telehealth: Payer: Self-pay | Admitting: Family

## 2018-06-27 NOTE — Telephone Encounter (Signed)
Received Prolia approval for patient 01/24/2018 sent messag easking ok to go with scheduling patient , I have re certified for 2020 and patient should be 0 out of pocket cost, ok to schedule?

## 2018-06-29 NOTE — Telephone Encounter (Signed)
Rachel Macias, patient declines treatment right now  [facility]

## 2018-06-29 NOTE — Telephone Encounter (Signed)
Call pt  My nurse , Olegario Messier, has reminded me that you had been approved for Prolia for osteoporosis.   In reviewing my notes, I believe we discussed holding on treatment as her preference.  If she desires to discuss osteoporosis of treatment options, we can certainly do that.   She would need bone density in 2021 since her last was 2019. We do them every 2 years

## 2018-06-29 NOTE — Telephone Encounter (Signed)
noted 

## 2018-09-13 ENCOUNTER — Encounter: Payer: Self-pay | Admitting: Psychiatry

## 2018-09-13 ENCOUNTER — Other Ambulatory Visit: Payer: Self-pay

## 2018-09-13 ENCOUNTER — Ambulatory Visit (INDEPENDENT_AMBULATORY_CARE_PROVIDER_SITE_OTHER): Payer: Medicare Other | Admitting: Psychiatry

## 2018-09-13 DIAGNOSIS — F3110 Bipolar disorder, current episode manic without psychotic features, unspecified: Secondary | ICD-10-CM | POA: Diagnosis not present

## 2018-09-13 DIAGNOSIS — G4701 Insomnia due to medical condition: Secondary | ICD-10-CM

## 2018-09-13 NOTE — Progress Notes (Signed)
Virtual Visit via Telephone Note  I connected with Rachel Macias on 09/13/18 at 10:30 AM EDT by telephone and verified that I am speaking with the correct person using two identifiers.   I discussed the limitations, risks, security and privacy concerns of performing an evaluation and management service by telephone and the availability of in person appointments. I also discussed with the patient that there may be a patient responsible charge related to this service. The patient expressed understanding and agreed to proceed.    I discussed the assessment and treatment plan with the patient. The patient was provided an opportunity to ask questions and all were answered. The patient agreed with the plan and demonstrated an understanding of the instructions.   The patient was advised to call back or seek an in-person evaluation if the symptoms worsen or if the condition fails to improve as anticipated.   BH MD OP Progress Note  09/13/2018 12:06 PM Rachel PetersJane Sprick  MRN:  [id number]  Chief Complaint:  Chief Complaint    Follow-up     HPI: Rachel Macias is a 73 year old Caucasian female, widowed, [place] in [place], has a history of bipolar disorder, insomnia was evaluated by telemedicine today.  Patient was offered video call, however she preferred to do a phone call.  Patient today reports she continues to stay safe from the COVID-19.  She does worry about it a lot however she is happy that she and her family are safe and it has not affected her personally until now.  She however is worried about everything that is going on around the wound.  She however has been keeping herself busy working in her yard and picking up some projects.  She also has been talking to friends by phone or  social media.  She reports her mood symptoms as currently stable on the current medication regimen.  She reports sleep is good.  She denies any suicidality, homicidality or perceptual disturbances.  Patient denies any other  concerns today.  Discussed getting Depakote labs , however patient reports she would like to wait.  Discussed with patient to monitor herself closely for any side effects and let writer know.  Visit Diagnosis:    ICD-10-CM   1. Bipolar I disorder, most recent episode (or current) manic (HCC)  F31.10   2. Insomnia due to medical condition  G47.01     Past Psychiatric History: I have reviewed past psychiatric history from my progress note on 06/10/2017.  Past trials of Prozac, lithium, Zyprexa.  Past Medical History:  Past Medical History:  Diagnosis Date  . Bipolar 1 disorder (HCC) 1995   Dr. Caryn SectionAarti Kapur [phone])  . Ex-smoker 2001   minimal  . Osteoporosis 2015   fosamax reaction, then was on boniva IV for ~1 yr  . Postmenopausal    age 73  . Vitamin D deficiency     Past Surgical History:  Procedure Laterality Date  . CESAREAN SECTION    . DEXA  09/2013   T -2.8 femur  . TUBAL LIGATION     with reversal    Family Psychiatric History: I have reviewed family psychiatric history from my progress note on 06/10/2017.  Family History:  Family History  Problem Relation Age of Onset  . Thyroid disease Other        runs in family  . Diabetes Brother   . Bipolar disorder Mother   . Depression Mother   . Anxiety disorder Mother   . Stroke Neg Hx   .  CAD Neg Hx   . Cancer Neg Hx     Social History: I have reviewed social history from my progress note on 06/10/2017. Social History   Socioeconomic History  . Marital status: Widowed    Spouse name: Not on file  . Number of children: 1  . Years of education: Not on file  . Highest education level: Associate degree: occupational, Hotel manager, or vocational program  Occupational History    Comment: retired  Scientific laboratory technician  . Financial resource strain: Not hard at all  . Food insecurity    Worry: Never true    Inability: Never true  . Transportation needs    Medical: No    Non-medical: No  Tobacco Use  . Smoking status:  Former Smoker    Quit date: 03/10/1999    Years since quitting: 19.5  . Smokeless tobacco: Never Used  Substance and Sexual Activity  . Alcohol use: No  . Drug use: No  . Sexual activity: Not Currently  Lifestyle  . Physical activity    Days per week: 7 days    Minutes per session: 30 min  . Stress: Not at all  Relationships  . Social Herbalist on phone: Three times a week    Gets together: Once a week    Attends religious service: More than 4 times per year    Active member of club or organization: No    Attends meetings of clubs or organizations: Never    Relationship status: Widowed  Other Topics Concern  . Not on file  Social History Narrative   Lives alone with 2 dogs, widow   69 yrs ago son died at age 74yo (hit by car).   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   Occupation: retired, used to work for [employer] ([place])   Edu: 13 yrs   Activity: no regular exercise   Diet: good water, fruits/vegetables some    Allergies:  Allergies  Allergen Reactions  . Alendronate Sodium Other (See Comments)    GI distress GI distress GI distress  . Amoxicillin Hives    Metabolic Disorder Labs: Lab Results  Component Value Date   HGBA1C 5.0 01/13/2018   Lab Results  Component Value Date   PROLACTIN 11.7 01/13/2018   PROLACTIN 10.8 04/16/2017   Lab Results  Component Value Date   CHOL 144 01/13/2018   TRIG 142 01/13/2018   HDL 49 01/13/2018   CHOLHDL 7 12/24/2015   VLDL 47.2 (H) 12/24/2015   LDLCALC 67 01/13/2018   LDLCALC 202 (H) 08/15/2013   Lab Results  Component Value Date   TSH 3.30 12/24/2015   TSH 2.93 10/12/2014    Therapeutic Level Labs: Lab Results  Component Value Date   LITHIUM 1.40 (H) 08/15/2016   LITHIUM 2.10 (HH) 08/15/2016   Lab Results  Component Value Date   VALPROATE 78 01/13/2018   VALPROATE 69 04/16/2017   No components found for:  CBMZ  Current Medications: Current Outpatient Medications   Medication Sig Dispense Refill  . cholecalciferol (VITAMIN D) 1000 UNITS tablet Take 2 tablets (2,000 Units total) by mouth daily.    . divalproex (DEPAKOTE ER) 500 MG 24 hr tablet Take 2 tablets (1,000 mg total) by mouth at bedtime. 180 tablet 1  . QUEtiapine (SEROQUEL) 200 MG tablet Take 1 tablet (200 mg total) by mouth at bedtime. 90 tablet 1  . simvastatin (ZOCOR) 20 MG tablet Take 1 tablet (20 mg total)  by mouth every evening. 90 tablet 3   No current facility-administered medications for this visit.      Musculoskeletal: Strength & Muscle Tone: UTA Gait & Station: UTA Patient leans: N/A  Psychiatric Specialty Exam: Review of Systems  Psychiatric/Behavioral: The patient is not nervous/anxious.   All other systems reviewed and are negative.   There were no vitals taken for this visit.There is no height or weight on file to calculate BMI.  General Appearance: UTA  Eye Contact:  UTA  Speech:  Clear and Coherent  Volume:  Normal  Mood:  Euthymic  Affect:  UTA  Thought Process:  Goal Directed and Descriptions of Associations: Intact  Orientation:  Full (Time, Place, and Person)  Thought Content: UTA   Suicidal Thoughts:  No  Homicidal Thoughts:  No  Memory:  Immediate;   Fair Recent;   Fair Remote;   Fair  Judgement:  Fair  Insight:  Fair  Psychomotor Activity:  UTA  Concentration:  Concentration: Fair and Attention Span: Fair  Recall:  [employer]  Fund of Knowledge: Fair  Language: Fair  Akathisia:  No  Handed:  Right  AIMS (if indicated): Denies rigidity,stiffness  Assets:  Communication Skills Desire for Improvement Social Support  ADL's:  Intact  Cognition: WNL  Sleep:  Fair   Screenings: PHQ2-9     [facility]2016 [facility]  PHQ-2  Total Score  0  0  0  0       Assessment and Plan: Rachel Macias is a 73 year old Caucasian female who has a history of bipolar disorder, history of lithium toxicity, was evaluated by telemedicine today.  Patient is currently doing well on the current medication regimen.  Below.  Plan For bipolar disorder-stable Depakote ER 1000 mg p.o. nightly Depakote level on 01/13/2018 -78-therapeutic Seroquel 200 mg p.o. nightly.  For insomnia- stable Continue Seroquel which helps.  Follow-up in clinic in 3 to 4 months or sooner if needed.  October 2 at 10 AM.  Discussed getting Depakote labs done-patient however wants to wait given the COVID-19 outbreak.  Discussed with her to monitor herself closely for any adverse side effects.  I have spent atleast 15 minutes non face to face with patient today. More than 50 % of the time was spent for psychoeducation and supportive psychotherapy and care coordination.  This note was generated in part or whole with voice recognition software. Voice recognition is usually quite accurate but there are transcription errors that can and very often do occur. I apologize for any typographical errors that were not detected and corrected.        Jomarie LongsSaramma Jubilee Vivero, MD 09/13/2018, 12:06 PM

## 2018-10-24 ENCOUNTER — Other Ambulatory Visit: Payer: Self-pay | Admitting: Psychiatry

## 2018-10-24 DIAGNOSIS — F3110 Bipolar disorder, current episode manic without psychotic features, unspecified: Secondary | ICD-10-CM

## 2018-12-09 ENCOUNTER — Other Ambulatory Visit: Payer: Self-pay

## 2018-12-09 ENCOUNTER — Ambulatory Visit (INDEPENDENT_AMBULATORY_CARE_PROVIDER_SITE_OTHER): Payer: Medicare Other | Admitting: Psychiatry

## 2018-12-09 ENCOUNTER — Encounter: Payer: Self-pay | Admitting: Psychiatry

## 2018-12-09 ENCOUNTER — Telehealth: Payer: Self-pay

## 2018-12-09 DIAGNOSIS — F5105 Insomnia due to other mental disorder: Secondary | ICD-10-CM | POA: Diagnosis not present

## 2018-12-09 DIAGNOSIS — Z79899 Other long term (current) drug therapy: Secondary | ICD-10-CM | POA: Diagnosis not present

## 2018-12-09 DIAGNOSIS — F3174 Bipolar disorder, in full remission, most recent episode manic: Secondary | ICD-10-CM | POA: Diagnosis not present

## 2018-12-09 MED ORDER — DIVALPROEX SODIUM ER 500 MG PO TB24: 1000.0000 mg | ORAL_TABLET | Freq: Every day | ORAL | 1 refills | Status: DC

## 2018-12-09 NOTE — Progress Notes (Signed)
Virtual Visit via Telephone Note  I connected with Rachel PetersJane Domingo on 12/09/18 at 10:00 AM EDT by telephone and verified that I am speaking with the correct person using two identifiers.   I discussed the limitations, risks, security and privacy concerns of performing an evaluation and management service by telephone and the availability of in person appointments. I also discussed with the patient that there may be a patient responsible charge related to this service. The patient expressed understanding and agreed to proceed.   I discussed the assessment and treatment plan with the patient. The patient was provided an opportunity to ask questions and all were answered. The patient agreed with the plan and demonstrated an understanding of the instructions.   The patient was advised to call back or seek an in-person evaluation if the symptoms worsen or if the condition fails to improve as anticipated.   BH MD OP Progress Note  12/09/2018 12:24 PM Rachel Macias  MRN:  [id number]  Chief Complaint:  Chief Complaint    Follow-up     HPI: Rachel Macias is a 73 year old Caucasian female, widowed, lives in [place], has a history of bipolar disorder, insomnia was evaluated by phone today.  Patient preferred to do a phone call.  Patient today reports she is currently anxious about the current COVID-19 situation.  She does have good support system from her sister and her brother-in-law.  She however reports there are times when she feels she cannot do the things that she normally would have done because of the precautions that she has to take due to the pandemic.  However she reports she has been coping okay.  She reports sleep is good.  She reports appetite is fair.  She denies any manic or hypomanic episodes or depressive symptoms at this time.  She reports she is compliant on her medications as prescribed.  She denies any side effects.  Offered to refer her for psychotherapy sessions however she declines.  She  reports she does not need a professional but a friend to talk to.  Discussed with patient that she will need her Depakote labs done soon.  Will mail lab slip to plan.  Also discussed that since her mood symptoms are currently stable her Seroquel can be gradually tapered down.  However she prefers to wait a few months and may be try that when she returns. Visit Diagnosis:    ICD-10-CM   1. Bipolar disorder, in full remission, most recent episode manic (HCC)  F31.74   2. High risk medication use  Z79.899 Valproic acid level    Comprehensive metabolic panel    CBC With Differential  3. Insomnia due to mental condition  F51.05 divalproex (DEPAKOTE ER) 500 MG 24 hr tablet   in early remission    Past Psychiatric History: I have reviewed past psychiatric history from my progress note on 06/10/2017.  Past trials of Prozac, lithium, Zyprexa  Past Medical History:  Past Medical History:  Diagnosis Date  . Bipolar 1 disorder (HCC) 1995   Dr. Caryn SectionAarti Kapur [phone])  . Ex-smoker 2001   minimal  . Osteoporosis 2015   fosamax reaction, then was on boniva IV for ~1 yr  . Postmenopausal    age 73  . Vitamin D deficiency     Past Surgical History:  Procedure Laterality Date  . CESAREAN SECTION    . DEXA  09/2013   T -2.8 femur  . TUBAL LIGATION     with reversal    Family Psychiatric  History: I have reviewed family psychiatric history from my progress note on 06/10/2017.  Family History:  Family History  Problem Relation Age of Onset  . Thyroid disease Other        runs in family  . Diabetes Brother   . Bipolar disorder Mother   . Depression Mother   . Anxiety disorder Mother   . Stroke Neg Hx   . CAD Neg Hx   . Cancer Neg Hx     Social History: I have reviewed social history from my progress note on 06/10/2017. Social History   Socioeconomic History  . Marital status: Widowed    Spouse name: Not on file  . Number of children: 1  . Years of education: Not on file  . Highest  education level: Associate degree: occupational, Scientist, product/process development, or vocational program  Occupational History    Comment: retired  Engineer, production  . Financial resource strain: Not hard at all  . Food insecurity    Worry: Never true    Inability: Never true  . Transportation needs    Medical: No    Non-medical: No  Tobacco Use  . Smoking status: Former Smoker    Quit date: 03/10/1999    Years since quitting: 19.7  . Smokeless tobacco: Never Used  Substance and Sexual Activity  . Alcohol use: No  . Drug use: No  . Sexual activity: Not Currently  Lifestyle  . Physical activity    Days per week: 7 days    Minutes per session: 30 min  . Stress: Not at all  Relationships  . Social Musician on phone: Three times a week    Gets together: Once a week    Attends religious service: More than 4 times per year    Active member of club or organization: No    Attends meetings of clubs or organizations: Never    Relationship status: Widowed  Other Topics Concern  . Not on file  Social History Narrative   Lives alone with 2 dogs, widow   30 yrs ago son died at age 64yo (hit by car).   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 Alendronate Sodium Other (See Comments)    GI distress GI distress GI distress  . Amoxicillin Hives    Metabolic Disorder Labs: Lab Results  Component Value Date   HGBA1C 5.0 01/13/2018   Lab Results  Component Value Date   PROLACTIN 11.7 01/13/2018   PROLACTIN 10.8 04/16/2017   Lab Results  Component Value Date   CHOL 144 01/13/2018   TRIG 142 01/13/2018   HDL 49 01/13/2018   CHOLHDL 7 12/24/2015   VLDL 47.2 (H) 12/24/2015   LDLCALC 67 01/13/2018   LDLCALC 202 (H) 08/15/2013   Lab Results  Component Value Date   TSH 3.30  12/24/2015   TSH 2.93 10/12/2014    Therapeutic Level Labs: Lab Results  Component Value Date   LITHIUM 1.40 (H) 08/15/2016   LITHIUM 2.10 (HH) 08/15/2016   Lab Results  Component Value Date   VALPROATE 78 01/13/2018   VALPROATE 69 04/16/2017   No components found for:  CBMZ  Current Medications: Current Outpatient Medications  Medication Sig Dispense Refill  . cholecalciferol (VITAMIN D)  1000 UNITS tablet Take 2 tablets (2,000 Units total) by mouth daily.    . divalproex (DEPAKOTE ER) 500 MG 24 hr tablet Take 2 tablets (1,000 mg total) by mouth at bedtime. 180 tablet 1  . QUEtiapine (SEROQUEL) 200 MG tablet TAKE 1 TABLET BY MOUTH AT  BEDTIME 90 tablet 3  . simvastatin (ZOCOR) 20 MG tablet Take 1 tablet (20 mg total) by mouth every evening. 90 tablet 3   No current facility-administered medications for this visit.      Musculoskeletal: Strength & Muscle Tone: UTA Gait & Station: Reports as WNL Patient leans: N/A  Psychiatric Specialty Exam: Review of Systems  Psychiatric/Behavioral: The patient is nervous/anxious.   All other systems reviewed and are negative.   There were no vitals taken for this visit.There is no height or weight on file to calculate BMI.  General Appearance: UTA  Eye Contact:  UTA  Speech:  Normal Rate  Volume:  Normal  Mood:  Anxious Situational , but coping well  Affect:  UTA  Thought Process:  Goal Directed and Descriptions of Associations: Intact  Orientation:  Full (Time, Place, and Person)  Thought Content: Logical   Suicidal Thoughts:  No  Homicidal Thoughts:  No  Memory:  Immediate;   Fair Recent;   Fair Remote;   Fair  Judgement:  Fair  Insight:  Fair  Psychomotor Activity:  UTA  Concentration:  Concentration: Fair and Attention Span: Fair  Recall:  [employer] of Knowledge: Fair  Language: Fair  Akathisia:  No  Handed:  Right  AIMS (if indicated): denies tremors, rigidity  Assets:  Communication Skills Desire for  Improvement Housing Social Support  ADL's:  Intact  Cognition: WNL  Sleep:  Fair   Screenings: PHQ2-9     Office Visit from 05/29/2016 in [place] Office Visit from 02/12/2016 in [place] [facility] Visit from 09/14/2014 in [place] [facility] Visit from 08/22/2013 in [place] at [facility] Total Score  0  0  0  0       Assessment and Plan: Mitsue is a 73 year old Caucasian female who has a history of bipolar disorder, history of lithium toxicity was evaluated by telemedicine today.  Patient is currently doing well on the current medication regimen.  Plan as noted below.  Plan Bipolar disorder in remission Depakote ER 1000 mg p.o. nightly Depakote level on 01/13/2018- 78-therapeutic. Seroquel 200 mg p.o. nightly Discussed with patient that Seroquel can be tapered down since she has been stable.  However patient prefers to wait for the next few months.  Will reevaluate at her next appointment.  Insomnia-stable Continue Seroquel which helps.  We will order the following labs-Depakote level, CMP, CBC with differential since she is on Depakote.  Will mail lab slip to her today.  Follow-up in clinic in 4 to 5 months or sooner if needed.  February 10 at 10 AM  I have spent atleast 15 minutes non face to face with patient today. More than 50 % of the time was spent for psychoeducation and supportive psychotherapy and care coordination. This note was generated in part or whole with voice recognition software. Voice recognition is usually quite accurate but there are transcription errors that can and very often do occur. I apologize for any typographical errors that were not detected and corrected.      Ursula Alert, MD 12/09/2018, 12:24 PM

## 2018-12-09 NOTE — Telephone Encounter (Signed)
labwork orders mailed  

## 2018-12-14 ENCOUNTER — Encounter: Payer: Self-pay | Admitting: Family

## 2018-12-15 ENCOUNTER — Telehealth: Payer: Self-pay

## 2018-12-15 NOTE — Telephone Encounter (Signed)
Close  

## 2018-12-19 DIAGNOSIS — Z23 Encounter for immunization: Secondary | ICD-10-CM | POA: Diagnosis not present

## 2018-12-21 DIAGNOSIS — Z79899 Other long term (current) drug therapy: Secondary | ICD-10-CM | POA: Diagnosis not present

## 2018-12-22 ENCOUNTER — Telehealth: Payer: Self-pay | Admitting: Psychiatry

## 2018-12-22 LAB — COMPREHENSIVE METABOLIC PANEL
ALT: 6 IU/L (ref 0–32)
AST: 12 IU/L (ref 0–40)
Albumin/Globulin Ratio: 1.4 (ref 1.2–2.2)
Albumin: 4 g/dL (ref 3.7–4.7)
Alkaline Phosphatase: 78 IU/L (ref 39–117)
BUN/Creatinine Ratio: 15 (ref 12–28)
BUN: 15 mg/dL (ref 8–27)
Bilirubin Total: 0.3 mg/dL (ref 0.0–1.2)
CO2: 24 mmol/L (ref 20–29)
Calcium: 8.9 mg/dL (ref 8.7–10.3)
Chloride: 107 mmol/L — ABNORMAL HIGH (ref 96–106)
Creatinine, Ser: 1.03 mg/dL — ABNORMAL HIGH (ref 0.57–1.00)
GFR calc Af Amer: 62 mL/min/{1.73_m2} (ref >59)
GFR calc non Af Amer: 54 mL/min/{1.73_m2} — ABNORMAL LOW (ref >59)
Globulin, Total: 2.9 g/dL (ref 1.5–4.5)
Glucose: 96 mg/dL (ref 65–99)
Potassium: 4.1 mmol/L (ref 3.5–5.2)
Sodium: 145 mmol/L — ABNORMAL HIGH (ref 134–144)
Total Protein: 6.9 g/dL (ref 6.0–8.5)

## 2018-12-22 LAB — CBC WITH DIFFERENTIAL
Basophils Absolute: 0 10*3/uL (ref 0.0–0.2)
Basos: 0 %
EOS (ABSOLUTE): 0.2 10*3/uL (ref 0.0–0.4)
Eos: 4 %
Hematocrit: 35.6 % (ref 34.0–46.6)
Hemoglobin: 11.9 g/dL (ref 11.1–15.9)
Immature Grans (Abs): 0 10*3/uL (ref 0.0–0.1)
Immature Granulocytes: 0 %
Lymphocytes Absolute: 1.6 10*3/uL (ref 0.7–3.1)
Lymphs: 32 %
MCH: 32.7 pg (ref 26.6–33.0)
MCHC: 33.4 g/dL (ref 31.5–35.7)
MCV: 98 fL — ABNORMAL HIGH (ref 79–97)
Monocytes Absolute: 0.5 10*3/uL (ref 0.1–0.9)
Monocytes: 10 %
Neutrophils Absolute: 2.7 10*3/uL (ref 1.4–7.0)
Neutrophils: 54 %
RBC: 3.64 x10E6/uL — ABNORMAL LOW (ref 3.77–5.28)
RDW: 12.1 % (ref 11.7–15.4)
WBC: 5.1 10*3/uL (ref 3.4–10.8)

## 2018-12-22 LAB — VALPROIC ACID LEVEL: Valproic Acid Lvl: 68 ug/mL (ref 50–100)

## 2018-12-22 NOTE — Telephone Encounter (Signed)
Call patient regarding her lab report-dated on 12/22/2018 RBCs-low at 3.64 MCV-high at 84  Creatinine-slightly higher at 1.03 Sodium elevated at 145 chloride high at 107  AST ALT-within normal limits  Valproic acid level-60-therapeutic  Discussed with patient to call primary care provider to follow-up for abnormal labs.  We will fax a copy to her primary care provider today.

## 2018-12-23 ENCOUNTER — Telehealth: Payer: Self-pay

## 2018-12-23 NOTE — Telephone Encounter (Signed)
Copied from [place] [id number]. Topic: General - Other >> Dec 23, 2018 10:56 AM Oneta Rack wrote: Ursula Alert, MD psychiatrist faxing over labs for PCP to review, patient states her keratin level was 1.03 and was advised to increase her water intake.

## 2019-01-09 ENCOUNTER — Other Ambulatory Visit: Payer: Self-pay | Admitting: Family

## 2019-01-09 DIAGNOSIS — E785 Hyperlipidemia, unspecified: Secondary | ICD-10-CM

## 2019-01-10 ENCOUNTER — Other Ambulatory Visit: Payer: Self-pay

## 2019-01-10 DIAGNOSIS — E785 Hyperlipidemia, unspecified: Secondary | ICD-10-CM

## 2019-01-10 MED ORDER — SIMVASTATIN 20 MG PO TABS: ORAL_TABLET | ORAL | 0 refills | Status: DC

## 2019-01-18 ENCOUNTER — Ambulatory Visit: Payer: Medicare Other | Admitting: Family

## 2019-01-18 ENCOUNTER — Encounter: Payer: Self-pay | Admitting: Family

## 2019-01-18 ENCOUNTER — Ambulatory Visit: Payer: Medicare Other | Admitting: Family Medicine

## 2019-01-18 ENCOUNTER — Ambulatory Visit (INDEPENDENT_AMBULATORY_CARE_PROVIDER_SITE_OTHER): Payer: Medicare Other

## 2019-01-18 ENCOUNTER — Other Ambulatory Visit: Payer: Self-pay | Admitting: Internal Medicine

## 2019-01-18 ENCOUNTER — Other Ambulatory Visit: Payer: Self-pay

## 2019-01-18 DIAGNOSIS — Z1231 Encounter for screening mammogram for malignant neoplasm of breast: Secondary | ICD-10-CM

## 2019-01-18 DIAGNOSIS — Z0000 Encounter for general adult medical examination without abnormal findings: Secondary | ICD-10-CM

## 2019-01-18 DIAGNOSIS — Z1211 Encounter for screening for malignant neoplasm of colon: Secondary | ICD-10-CM

## 2019-01-18 NOTE — Patient Instructions (Addendum)
  Rachel Macias , Thank you for taking time to come for your Medicare Wellness Visit. I appreciate your ongoing commitment to your health goals. Please review the following plan we discussed and let me know if I can assist you in the future.   These are the goals we discussed: Goals      Patient Stated   . Increase physical activity (pt-stated)     I will walk more for exercise       This is a list of the screening recommended for you and due dates:  Health Maintenance  Topic Date Due  . Tetanus Vaccine  08/28/1964  . Cologuard (Stool DNA test)  12/25/2018  . Mammogram  02/17/2020  . Flu Shot  Completed  . DEXA scan (bone density measurement)  Completed  .  Hepatitis C: One time screening is recommended by Center for Disease Control  (CDC) for  adults born from [birth year] through 1965.   Completed  . Pneumonia vaccines  Completed

## 2019-01-18 NOTE — Progress Notes (Addendum)
Subjective:   Rachel Macias is a 73 y.o. female who presents for Medicare Annual (Subsequent) preventive examination.  Review of Systems:  No ROS.  Medicare Wellness Virtual Visit.  Visual/audio telehealth visit, UTA vital signs.   See social history for additional risk factors.   Cardiac Risk Factors include: advanced age (>76men, >23 women)     Objective:     Vitals: There were no vitals taken for this visit.  There is no height or weight on file to calculate BMI.  Advanced Directives 01/18/2019 01/12/2018 01/04/2017  Does Patient Have a Medical Advance Directive? Yes Yes Yes  Type of Estate agent of Rachel Macias;Living will Healthcare Power of Rachel Macias;Living will Healthcare Power of Attorney  Does patient want to make changes to medical advance directive? No - Patient declined No - Patient declined No - Patient declined  Copy of Healthcare Power of Attorney in Chart? Yes - validated most recent copy scanned in chart (See row information) Yes - validated most recent copy scanned in chart (See row information) No - copy requested  Some encounter information is confidential and restricted. Go to Review Flowsheets activity to see all data.    Tobacco Social History   Tobacco Use  Smoking Status Former Smoker  . Quit date: 03/10/1999  . Years since quitting: 19.8  Smokeless Tobacco Never Used     Counseling given: Not Answered   Clinical Intake:  Pre-visit preparation completed: Yes        Diabetes: No  How often do you need to have someone help you when you read instructions, pamphlets, or other written materials from your doctor or pharmacy?: 1 - Never  Interpreter Needed?: No     Past Medical History:  Diagnosis Date  . Bipolar 1 disorder (HCC) 1995   Dr. Caryn Macias [phone])  . Ex-smoker 2001   minimal  . Osteoporosis 2015   fosamax reaction, then was on boniva IV for ~1 yr  . Postmenopausal    age 61  . Vitamin D deficiency    Past Surgical History:  Procedure Laterality Date  . CESAREAN Macias    . DEXA  09/2013   T -2.8 femur  . TUBAL LIGATION     with reversal   Family History  Problem Relation Age of Onset  . Thyroid disease Other        runs in family  . Diabetes Brother   . Bipolar disorder Mother   . Depression Mother   . Anxiety disorder Mother   . Stroke Neg Hx   . CAD Neg Hx   . Cancer Neg Hx    Social History   Socioeconomic History  . Marital status: Widowed    Spouse name: Not on file  . Number of children: 1  . Years of education: Not on file  . Highest education level: Associate degree: occupational, Scientist, product/process development, or vocational program  Occupational History    Comment: retired  Engineer, production  . Financial resource strain: Not hard at all  . Food insecurity    Worry: Never true    Inability: Never true  . Transportation needs    Medical: No    Non-medical: No  Tobacco Use  . Smoking status: Former Smoker    Quit date: 03/10/1999    Years since quitting: 19.8  . Smokeless tobacco: Never Used  Substance and Sexual Activity  . Alcohol use: No  . Drug use: No  . Sexual activity: Not Currently  Lifestyle  .  Physical activity    Days per week: 7 days    Minutes per session: 30 min  . Stress: Not at all  Relationships  . Social Musician on phone: Three times a week    Gets together: Once a week    Attends religious service: More than 4 times per year    Active member of club or organization: No    Attends meetings of clubs or organizations: Never    Relationship status: Widowed  Other Topics Concern  . Not on file  Social History Narrative   Lives alone with 2 dogs, widow   30 yrs ago son died at age 43yo (hit by car).   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 cholecalciferol (VITAMIN D) 1000 UNITS tablet Take 2 tablets (2,000 Units total) by mouth daily.  . divalproex (DEPAKOTE ER) 500 MG 24 hr tablet Take 2 tablets (1,000 mg total) by mouth at bedtime.  Marland Kitchen QUEtiapine (SEROQUEL) 200 MG tablet TAKE 1 TABLET BY MOUTH AT  BEDTIME  . simvastatin (ZOCOR) 20 MG tablet TAKE 1 TABLET(20 MG) BY MOUTH EVERY EVENING   No facility-administered encounter medications on file as of 01/18/2019.     Activities of Daily Living In your present state of health, do you have any difficulty performing the following activities: 01/18/2019  Hearing? N  Vision? N  Difficulty concentrating or making decisions? N  Walking or climbing stairs? N  Dressing or bathing? N  Doing errands, shopping? N  Preparing Food and eating ? N  Using the Toilet? N  In the past six months, have you accidently leaked urine? Y  Comment Managed with daily pad  Do you have problems with loss of bowel control? N  Managing your Medications? N  Managing your Finances? N  Housekeeping or managing your Housekeeping? N  Some recent data might be hidden    Patient Care Team: Rachel Grana, FNP as PCP - General (Family Medicine)    Assessment:   This is a routine wellness examination for Rachel Macias.  Nurse connected with patient 01/18/19 at 10:30 AM EST by a telephone enabled telemedicine application and verified that I am speaking with the correct person using two identifiers. Patient stated full name and DOB. Patient gave permission to continue with virtual visit. Patient's location was at home and Nurse's location was at Rachel Macias.   Health Maintenance Due: -Tdap- discussed; to be completed with doctor in visit or local pharmacy.   -Cologuard- ordered -Mammogram- she plans to schedule  Update all pending maintenance due as appropriate.   See completed HM at the end of note.   Eye: Visual acuity not assessed. Virtual visit. Followed  by their ophthalmologist.  Dental: Visits every 6 months.    Hearing: Demonstrates normal hearing during visit.  Safety:  Patient feels safe at home- yes Patient does have smoke detectors at home- yes Patient does wear sunscreen or protective clothing when in direct sunlight - yes Patient does wear seat belt when in a moving vehicle - yes Patient drives- yes Adequate lighting in walkways free from debris- yes Grab bars and handrails used as appropriate- yes Ambulates with no assistive device Cell phone on person when ambulating  outside of the home- yes  Social: Alcohol intake - no    Smoking history- former    Smokers in home? none Illicit drug use? none  Depression: PHQ 2 &9 complete. See screening below. Denies irritability, anhedonia, sadness/tearfullness.  Stable. Followed by Dr. Lalla Brothers every 3 months.   Falls: See screening below.    Medication: Taking as directed and without issues.   Covid-19: Precautions and sickness symptoms discussed. Wears mask, social distancing, hand hygiene as appropriate.   Activities of Daily Living Patient denies needing assistance with: household chores, feeding themselves, getting from bed to chair, getting to the toilet, bathing/showering, dressing, managing money, or preparing meals.   Memory: Patient is alert. Patient denies difficulty focusing or concentrating. Correctly identified the president of the [place], season and recall. Patient likes to read for brain stimulation.   BMI- discussed the importance of a healthy diet, water intake and the benefits of aerobic exercise.  Educational material provided.  Physical activity- walking  Diet:  Regular Water: good intake  Other Providers Patient Care Team: Burnard Hawthorne, FNP as PCP - General (Family Medicine) Exercise Activities and Dietary recommendations Current Exercise Habits: Home exercise routine, Type of exercise: walking, Intensity: Mild  Goals      Patient  Stated   . Increase physical activity (pt-stated)     I will walk more for exercise       Fall Risk Fall Risk  01/18/2019 01/24/2018 01/12/2018 01/04/2017 05/29/2016  Falls in the past year? 0 0 0 No No  Comment - Emmi Telephone Survey: data to providers prior to load - - -  Number falls in past yr: - - - - -  Injury with Fall? - - - - -     Timed Get Up and Go performed: no, virtual visit  Depression Screen PHQ 2/9 Scores 01/18/2019 01/12/2018 01/04/2017 05/29/2016  PHQ - 2 Score - - - 0  Exception Documentation Other- indicate reason in comment box Other- indicate reason in comment box Other- indicate reason in comment box -  Not completed - Psychiatrist visits every 3 months Currently in treatment with Dr.Braden ([facility]) -     Cognitive Function MMSE - Mini Mental State Exam 01/04/2017  Not completed: Refused     6CIT Screen 01/18/2019 01/12/2018  What Year? 0 points 0 points  What month? 0 points 0 points  What time? 0 points 0 points  Count back from 20 0 points 0 points  Months in reverse 0 points 0 points  Repeat phrase 0 points 0 points  Total Score 0 0    Immunization History  Administered Date(s) Administered  . Influenza, High Dose Seasonal PF 12/12/2015, 01/04/2017, 01/12/2018, 12/19/2018  . Influenza-Unspecified 01/07/2013  . Pneumococcal Conjugate-13 08/22/2013  . Pneumococcal Polysaccharide-23 09/14/2014   Screening Tests Health Maintenance  Topic Date Due  . TETANUS/TDAP  08/28/1964  . Fecal DNA (Cologuard)  12/25/2018  . MAMMOGRAM  02/17/2020  . INFLUENZA VACCINE  Completed  . DEXA SCAN  Completed  . Hepatitis C Screening  Completed  . PNA vac Low Risk Adult  Completed      Plan:   Keep all routine maintenance appointments.   Follow up 03/20/19 @ 11:00  Medicare Attestation I have personally reviewed: The patient's medical and social history Their use of alcohol, tobacco or illicit drugs Their current medications and supplements The  patient's functional ability including ADLs,fall risks, home safety risks, cognitive, and hearing and visual impairment Diet and physical activities Evidence for  depression   In addition, I have reviewed and discussed with patient certain preventive protocols, quality metrics, and best practice recommendations. A written personalized care plan for preventive services as well as general preventive health recommendations were provided to patient via mail.     Ashok PallOBrien-Blaney, Laquanna Veazey L, LPN  [id number]/01/2019   Reviewed above information.  Agree with assessment and plan.    Dr Lorin PicketScott

## 2019-01-24 DIAGNOSIS — Z1211 Encounter for screening for malignant neoplasm of colon: Secondary | ICD-10-CM | POA: Diagnosis not present

## 2019-01-31 LAB — COLOGUARD: Cologuard: NEGATIVE

## 2019-02-01 ENCOUNTER — Other Ambulatory Visit: Payer: Self-pay

## 2019-02-17 ENCOUNTER — Encounter: Payer: Self-pay | Admitting: Family

## 2019-02-21 ENCOUNTER — Ambulatory Visit
Admission: RE | Admit: 2019-02-21 | Discharge: 2019-02-21 | Disposition: A | Discharge status: Home / Self Care | Payer: Medicare Other | Source: Ambulatory Visit | Attending: Internal Medicine | Admitting: Internal Medicine

## 2019-02-21 DIAGNOSIS — Z1231 Encounter for screening mammogram for malignant neoplasm of breast: Secondary | ICD-10-CM | POA: Diagnosis not present

## 2019-02-21 IMAGING — MG DIGITAL SCREENING BILAT W/ TOMO W/ CAD
8 series · 8 of 24 positions shown · non-contrast
Comparison: Previous exam(s).

CLINICAL DATA: Screening.

EXAM:
DIGITAL SCREENING BILATERAL MAMMOGRAM WITH TOMO AND CAD

[R MLO synth-2D]
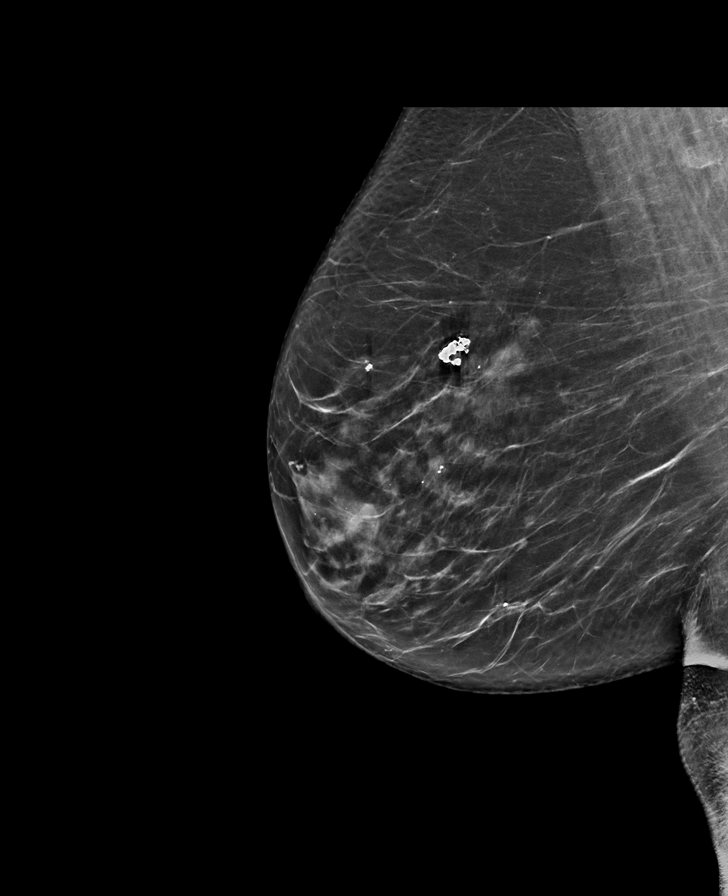

[L CC synth-2D]
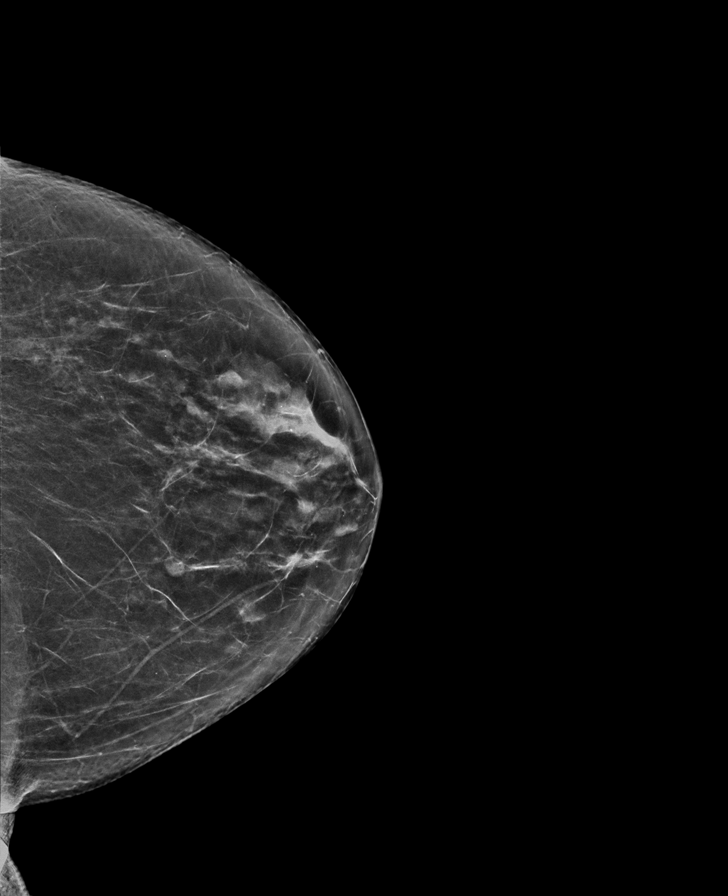

[L MLO synth-2D]
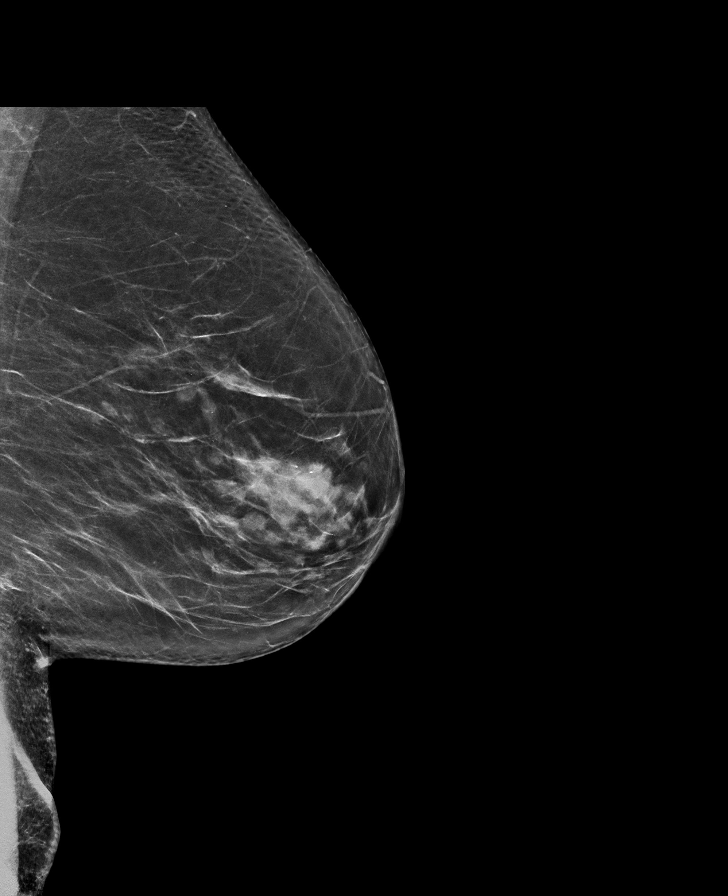

[R CC synth-2D]
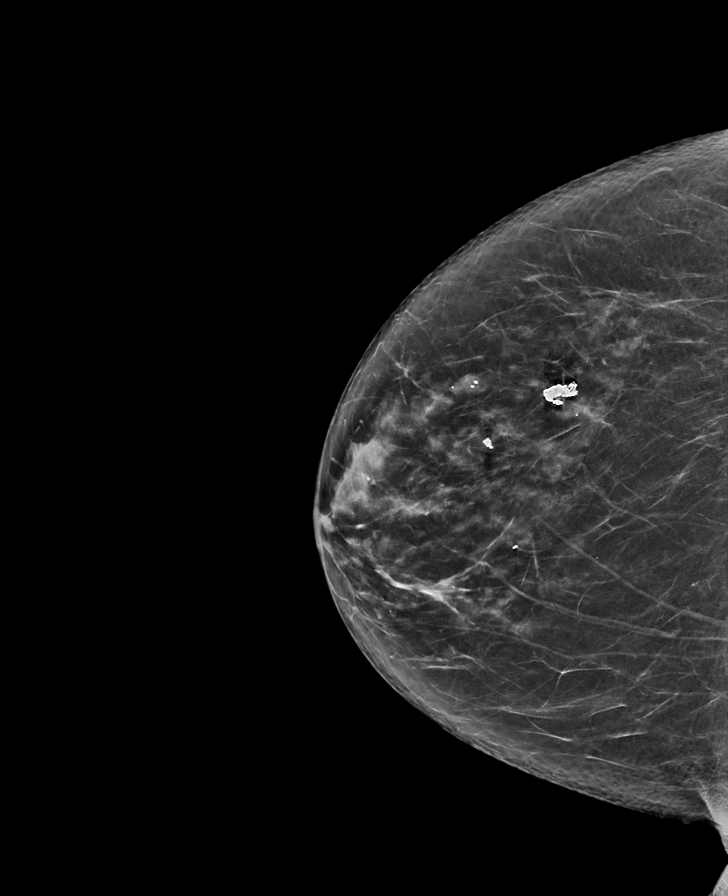

[L MLO tomo · tomo slice 35/70.0]
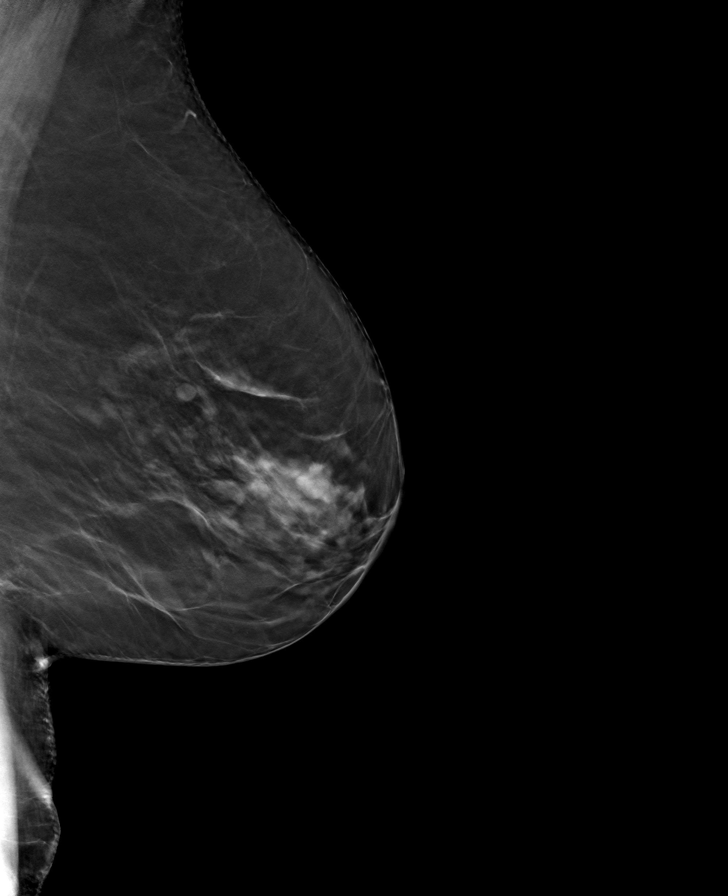

[R CC tomo · tomo slice 35/68.0]
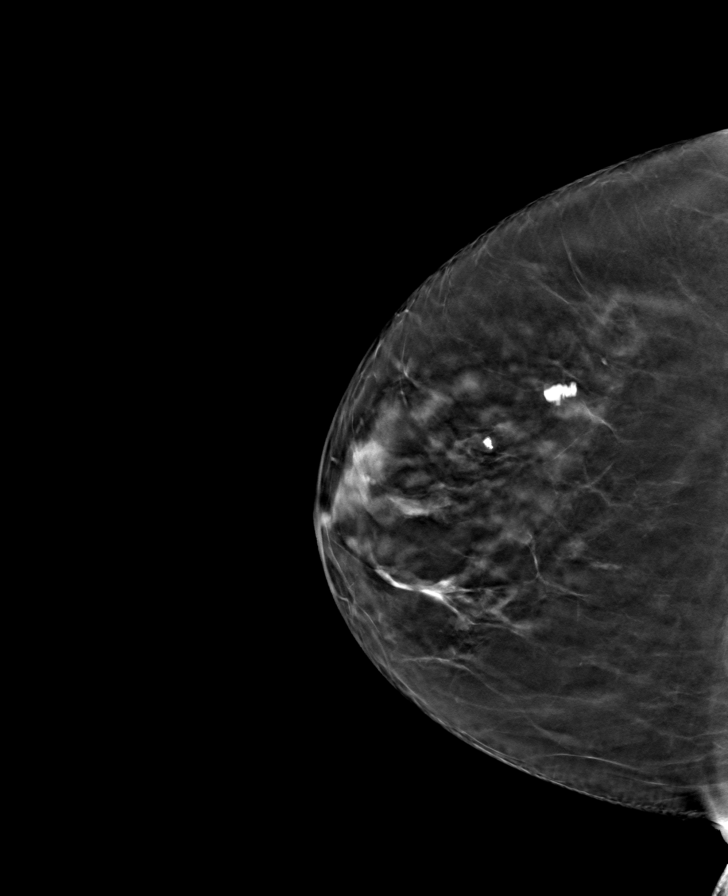

[L CC tomo · tomo slice 33/66.0]
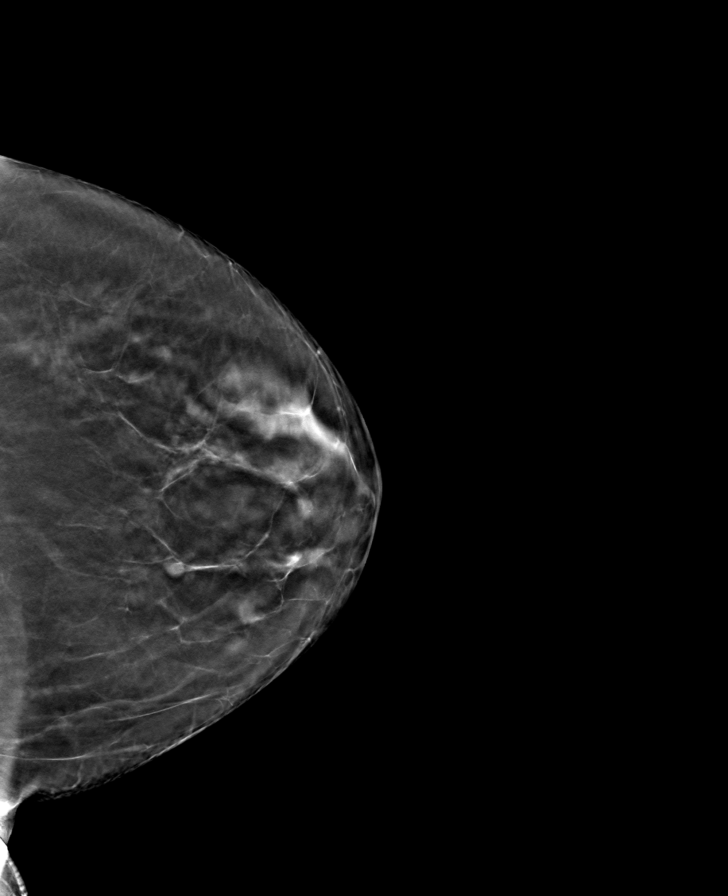

[R MLO tomo · tomo slice 39/76.0]
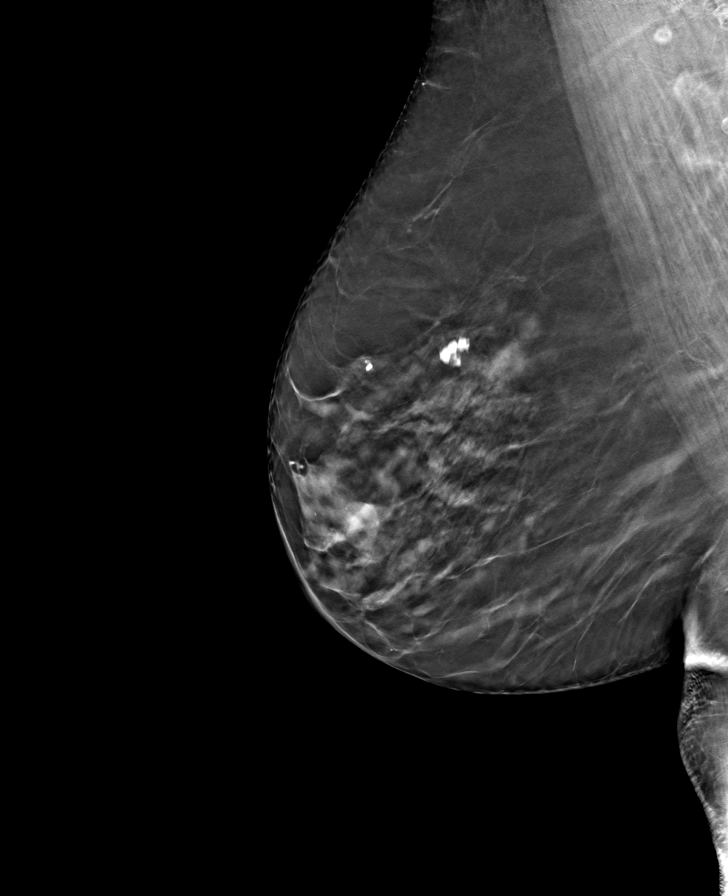

[8 of 24 positions shown; findings below may reference images not displayed]

ACR Breast Density Category c: The breast tissue is heterogeneously
dense, which may obscure small masses.
FINDINGS: There are no findings suspicious for malignancy. Images were
processed with CAD.
IMPRESSION: No mammographic evidence of malignancy. A result letter of this
screening mammogram will be mailed directly to the patient.

RECOMMENDATION:
Screening mammogram in one year. (Code:[id number])

BI-RADS CATEGORY  1: Negative.

## 2019-02-23 ENCOUNTER — Encounter: Payer: Self-pay | Admitting: Internal Medicine

## 2019-03-20 ENCOUNTER — Ambulatory Visit: Payer: Medicare Other | Admitting: Family

## 2019-04-03 ENCOUNTER — Other Ambulatory Visit: Payer: Self-pay | Admitting: Family

## 2019-04-03 DIAGNOSIS — E785 Hyperlipidemia, unspecified: Secondary | ICD-10-CM

## 2019-04-19 ENCOUNTER — Other Ambulatory Visit: Payer: Self-pay

## 2019-04-19 ENCOUNTER — Ambulatory Visit (INDEPENDENT_AMBULATORY_CARE_PROVIDER_SITE_OTHER): Payer: Medicare Other | Admitting: Psychiatry

## 2019-04-19 ENCOUNTER — Encounter: Payer: Self-pay | Admitting: Psychiatry

## 2019-04-19 DIAGNOSIS — F3174 Bipolar disorder, in full remission, most recent episode manic: Secondary | ICD-10-CM

## 2019-04-19 DIAGNOSIS — Z79899 Other long term (current) drug therapy: Secondary | ICD-10-CM | POA: Diagnosis not present

## 2019-04-19 NOTE — Progress Notes (Signed)
Provider Location : [facility] Patient Location : Home  Virtual Visit via Telephone Note  I connected with Rachel Macias on 04/19/19 at 10:00 AM EST by telephone and verified that I am speaking with the correct person using two identifiers.   I discussed the limitations, risks, security and privacy concerns of performing an evaluation and management service by telephone and the availability of in person appointments. I also discussed with the patient that there may be a patient responsible charge related to this service. The patient expressed understanding and agreed to proceed.      I discussed the assessment and treatment plan with the patient. The patient was provided an opportunity to ask questions and all were answered. The patient agreed with the plan and demonstrated an understanding of the instructions.   The patient was advised to call back or seek an in-person evaluation if the symptoms worsen or if the condition fails to improve as anticipated.   BH MD OP Progress Note  04/19/2019 5:31 PM Rachel Macias  MRN:  [id number]  Chief Complaint:  Chief Complaint    Follow-up     HPI: Rachel Macias is a 74 year old Caucasian female, widowed, lives in [place], has a history of bipolar disorder, insomnia was evaluated by phone today.  Patient preferred to do a phone call.  Patient today reports she is worried about the current pandemic and wants to get the COVID-19 vaccine.  She reports she has been trying to get in touch with the county to register however has not been very successful with it.  She reports mood wise she is otherwise doing okay.  She denies any manic or depressive symptoms.  She is compliant on medications as prescribed.  Patient denies any suicidality, homicidality or perceptual disturbances.  She continues to have good support system from her sister.  She denies any other concerns today.   Visit Diagnosis:    ICD-10-CM   1. Bipolar disorder, in full remission, most recent  episode manic (HCC)  F31.74   2. High risk medication use  Z79.899     Past Psychiatric History: I have reviewed past psychiatric history from my progress note on 06/10/2017.  Past trials of Prozac, lithium, Zyprexa.  Past Medical History:  Past Medical History:  Diagnosis Date  . Bipolar 1 disorder (HCC) 1995   Dr. Caryn Section [phone])  . Ex-smoker 2001   minimal  . Osteoporosis 2015   fosamax reaction, then was on boniva IV for ~1 yr  . Postmenopausal    age 20  . Vitamin D deficiency     Past Surgical History:  Procedure Laterality Date  . CESAREAN SECTION    . DEXA  09/2013   T -2.8 femur  . TUBAL LIGATION     with reversal    Family Psychiatric History: I have reviewed family psychiatric history from my progress note on 06/10/2017.  Family History:  Family History  Problem Relation Age of Onset  . Thyroid disease Other        runs in family  . Diabetes Brother   . Bipolar disorder Mother   . Depression Mother   . Anxiety disorder Mother   . Stroke Neg Hx   . CAD Neg Hx   . Cancer Neg Hx     Social History: I have reviewed social history from my progress note on 06/10/2017. Social History   Socioeconomic History  . Marital status: Widowed    Spouse name: Not on file  . Number of children:  1  . Years of education: Not on file  . Highest education level: Associate degree: occupational, Scientist, product/process development, or vocational program  Occupational History    Comment: retired  Tobacco Use  . Smoking status: Former Smoker    Quit date: 03/10/1999    Years since quitting: 20.1  . Smokeless tobacco: Never Used  Substance and Sexual Activity  . Alcohol use: No  . Drug use: No  . Sexual activity: Not Currently  Other Topics Concern  . Not on file  Social History Narrative   Lives alone with 2 dogs, widow   30 yrs ago son died at age 30yo (hit by car).   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 Difficulty of Paying Living Expenses: Not on file  Food Insecurity:   . Worried About Programme researcher, broadcasting/film/video in the Last Year: Not on file  . Ran Out of Food in the Last Year: Not on file  Transportation Needs:   . Lack of Transportation (Medical): Not on file  . Lack of Transportation (Non-Medical): Not on file  Physical Activity:   . Days of Exercise per Week: Not on file  . Minutes of Exercise per Session: Not on file  Stress:   . Feeling of Stress : Not on file  Social Connections:   . Frequency of Communication with Friends and Family: Not on file  . Frequency of Social Gatherings with Friends and Family: Not on file  . Attends Religious Services: Not on file  . Active Member of Clubs or Organizations: Not on file  . Attends Banker Meetings: Not on file  . Marital Status: Not on file    Allergies:  Allergies  Allergen Reactions  . Alendronate Sodium Other (See Comments)    GI distress GI distress GI distress  . Amoxicillin Hives    Metabolic Disorder Labs: Lab Results  Component Value Date   HGBA1C 5.0 01/13/2018   Lab Results  Component Value Date   PROLACTIN 11.7 01/13/2018   PROLACTIN 10.8 04/16/2017   Lab Results  Component Value Date   CHOL 144 01/13/2018   TRIG 142 01/13/2018   HDL 49 01/13/2018   CHOLHDL 7 12/24/2015   VLDL 47.2 (H) 12/24/2015   LDLCALC 67 01/13/2018   LDLCALC 202 (H) 08/15/2013   Lab Results  Component Value Date   TSH 3.30 12/24/2015   TSH 2.93 10/12/2014    Therapeutic Level Labs: Lab Results  Component Value Date   LITHIUM 1.40 (H) 08/15/2016   LITHIUM 2.10 (HH) 08/15/2016   Lab Results  Component Value Date   VALPROATE 68 12/21/2018   VALPROATE 78 01/13/2018   No components found for:  CBMZ  Current Medications: Current Outpatient Medications   Medication Sig Dispense Refill  . cholecalciferol (VITAMIN D) 1000 UNITS tablet Take 2 tablets (2,000 Units total) by mouth daily.    . divalproex (DEPAKOTE ER) 500 MG 24 hr tablet Take 2 tablets (1,000 mg total) by mouth at bedtime. 180 tablet 1  . QUEtiapine (SEROQUEL) 200 MG tablet TAKE 1 TABLET BY MOUTH AT  BEDTIME 90 tablet 3  . simvastatin (ZOCOR) 20 MG tablet TAKE 1 TABLET(20 MG) BY MOUTH EVERY EVENING 90 tablet 0  No current facility-administered medications for this visit.     Musculoskeletal: Strength & Muscle Tone: UTA Gait & Station: Reports as WNL Patient leans: N/A  Psychiatric Specialty Exam: Review of Systems  Psychiatric/Behavioral: Negative for agitation, behavioral problems, confusion, decreased concentration, dysphoric mood, hallucinations, self-injury, sleep disturbance and suicidal ideas. The patient is not nervous/anxious and is not hyperactive.   All other systems reviewed and are negative.   There were no vitals taken for this visit.There is no height or weight on file to calculate BMI.  General Appearance: UTA  Eye Contact:  UTA  Speech:  Clear and Coherent  Volume:  Normal  Mood:  Euthymic  Affect:  UTA  Thought Process:  Goal Directed and Descriptions of Associations: Intact  Orientation:  Full (Time, Place, and Person)  Thought Content: Logical   Suicidal Thoughts:  No  Homicidal Thoughts:  No  Memory:  Immediate;   Fair Recent;   Fair Remote;   Fair  Judgement:  Fair  Insight:  Fair  Psychomotor Activity:  UTA  Concentration:  Concentration: Fair and Attention Span: Fair  Recall:  [employer] of Knowledge: Fair  Language: Fair  Akathisia:  No  Handed:  Right  AIMS (if indicated): Denies tremors, rigidity  Assets:  Communication Skills Desire for Improvement Housing Social Support  ADL's:  Intact  Cognition: WNL  Sleep:  Fair   Screenings: PHQ2-9     Office Visit from 05/29/2016 in [place] Office Visit from  02/12/2016 in [place] Office Visit from 09/14/2014 in [place] at [place] from 08/22/2013 in [place] at [facility]  PHQ-2 Total Score  0  0  0  0       Assessment and Plan: Rachel Macias is a 74 year old Caucasian female, has a history of bipolar disorder, history of lithium toxicity, was evaluated by phone today.  Patient is currently stable on current medication regimen.  Plan as noted below.  Plan Bipolar disorder in remission Depakote ER 1000 mg p.o. nightly. Depakote level on 12/21/2018 -68-therapeutic I have also reviewed CMP dated 12/21/2018-AST ALT-within normal limits, sodium slightly elevated at 145.  Creatinine slightly elevated and patient was advised to reach out to her primary care provider and the results were also faxed to the provider previously. CBC with differential-RBC-low at 3.64-otherwise within normal limits then she was advised to contact her primary care for follow-up. Continue Seroquel 200 mg p.o. nightly.  Patient's bipolar symptoms have been stable since the past 3 years.  Writer discussed with patient about coming off of the neuroleptic due to possible adverse side effects likely due to long-term use.  Patient however declines and reports she wants to stay on the Seroquel and is not interested in tapering it off.  Insomnia-stable Continue Seroquel as prescribed.  Also provided information for COVID-19 vaccination clinic-advised patient to contact [phone] to register.  Follow-up in clinic in 4 months or sooner if needed.  June 9 at 10 AM  I have spent atleast 20 minutes non  face to face with patient today. More than 50 % of the time was spent for preparing to see the patient ( e.g., review of test, records ), obtaining and to review and separately obtained history , ordering medications and test ,psychoeducation and supportive psychotherapy and care coordination,as well as documenting clinical information in  electronic health record,interpreting results of test and communication of results This note was generated in part or whole with voice recognition  software. Voice recognition is usually quite accurate but there are transcription errors that can and very often do occur. I apologize for any typographical errors that were not detected and corrected.        Jomarie Longs, MD 04/19/2019, 5:31 PM

## 2019-04-30 ENCOUNTER — Other Ambulatory Visit: Payer: Self-pay | Admitting: Psychiatry

## 2019-04-30 DIAGNOSIS — F5105 Insomnia due to other mental disorder: Secondary | ICD-10-CM

## 2019-05-03 ENCOUNTER — Other Ambulatory Visit: Payer: Self-pay

## 2019-05-08 ENCOUNTER — Other Ambulatory Visit: Payer: Self-pay

## 2019-05-08 ENCOUNTER — Ambulatory Visit (INDEPENDENT_AMBULATORY_CARE_PROVIDER_SITE_OTHER): Payer: Medicare Other | Admitting: Family

## 2019-05-08 ENCOUNTER — Encounter: Payer: Self-pay | Admitting: Family

## 2019-05-08 DIAGNOSIS — M810 Age-related osteoporosis without current pathological fracture: Secondary | ICD-10-CM

## 2019-05-08 DIAGNOSIS — F319 Bipolar disorder, unspecified: Secondary | ICD-10-CM | POA: Diagnosis not present

## 2019-05-08 DIAGNOSIS — E785 Hyperlipidemia, unspecified: Secondary | ICD-10-CM

## 2019-05-08 MED ORDER — SIMVASTATIN 20 MG PO TABS: ORAL_TABLET | ORAL | 2 refills | Status: DC

## 2019-05-08 NOTE — Assessment & Plan Note (Signed)
Chronic. Pending lipid panel

## 2019-05-08 NOTE — Assessment & Plan Note (Signed)
Stable, follows with psychiatry

## 2019-05-08 NOTE — Progress Notes (Signed)
Subjective:    Patient ID: Rachel Macias, female    DOB: May 13, 1945, 74 y.o.   MRN: [id number]  CC: Rachel Macias is a 74 y.o. female who presents today for follow up.   HPI: Coping well. Reading, small dog.  Enjoys nieces.   Bipolar- Continues to follow with Dr Elna Breslow. Feels well on current regimen.   Elevated Crt 12/2018.   HLD- feels well on zocor.   Due dexa 02/2020  UTD mammogram      HISTORY:  Past Medical History:  Diagnosis Date  . Bipolar 1 disorder (HCC) 1995   Dr. Caryn Section [phone])  . Ex-smoker 2001   minimal  . Osteoporosis 2015   fosamax reaction, then was on boniva IV for ~1 yr  . Postmenopausal    age 17  . Vitamin D deficiency    Past Surgical History:  Procedure Laterality Date  . CESAREAN SECTION    . DEXA  09/2013   T -2.8 femur  . TUBAL LIGATION     with reversal   Family History  Problem Relation Age of Onset  . Thyroid disease Other        runs in family  . Diabetes Brother   . Bipolar disorder Mother   . Depression Mother   . Anxiety disorder Mother   . Stroke Neg Hx   . CAD Neg Hx   . Cancer Neg Hx     Allergies: Alendronate sodium and Amoxicillin Current Outpatient Medications on File Prior to Visit  Medication Sig Dispense Refill  . cholecalciferol (VITAMIN D) 1000 UNITS tablet Take 2 tablets (2,000 Units total) by mouth daily.    . divalproex (DEPAKOTE ER) 500 MG 24 hr tablet TAKE 2 TABLETS BY MOUTH AT  BEDTIME 180 tablet 3  . QUEtiapine (SEROQUEL) 200 MG tablet TAKE 1 TABLET BY MOUTH AT  BEDTIME 90 tablet 3   No current facility-administered medications on file prior to visit.    Social History   Tobacco Use  . Smoking status: Former Smoker    Quit date: 03/10/1999    Years since quitting: 20.1  . Smokeless tobacco: Never Used  Substance Use Topics  . Alcohol use: No  . Drug use: No    Review of Systems  Constitutional: Negative for chills and fever.  Respiratory: Negative for cough.   Cardiovascular:  Negative for chest pain and palpitations.  Gastrointestinal: Negative for nausea and vomiting.      Objective:    BP 100/62   Pulse 96   Temp (!) 96.1 F (35.6 C) (Temporal)   Ht 5\' 3"  (1.6 m)   Wt 155 lb (70.3 kg)   SpO2 97%   BMI 27.46 kg/m  BP Readings from Last 3 Encounters:  05/08/19 100/62  01/12/18 122/84  01/12/18 122/84   Wt Readings from Last 3 Encounters:  05/08/19 155 lb (70.3 kg)  01/12/18 144 lb 8 oz (65.5 kg)  01/12/18 144 lb 12.8 oz (65.7 kg)    Physical Exam Vitals reviewed.  Constitutional:      Appearance: She is well-developed.  Eyes:     Conjunctiva/sclera: Conjunctivae normal.  Cardiovascular:     Rate and Rhythm: Normal rate and regular rhythm.     Pulses: Normal pulses.     Heart sounds: Normal heart sounds.  Pulmonary:     Effort: Pulmonary effort is normal.     Breath sounds: Normal breath sounds. No wheezing, rhonchi or rales.  Skin:    General: Skin is  warm and dry.  Neurological:     Mental Status: She is alert.  Psychiatric:        Speech: Speech normal.        Behavior: Behavior normal.        Thought Content: Thought content normal.        Assessment & Plan:   Problem List Items Addressed This Visit      Musculoskeletal and Integument   Osteoporosis    Chronic. Declined medication in the past. She will continue calcium, vitamin d. Advised repeat dexa 02/2020, patient will call the office so we can place order closer to time.         Other   Bipolar 1 disorder ([place])    Stable, follows with psychiatry      Dyslipidemia    Chronic. Pending lipid panel      Relevant Medications   simvastatin (ZOCOR) 20 MG tablet    Other Visit Diagnoses    Hyperlipidemia, unspecified hyperlipidemia type       Relevant Medications   simvastatin (ZOCOR) 20 MG tablet   Other Relevant Orders   Lipid panel   Comprehensive metabolic panel       I am having Latorria Zeoli maintain her cholecalciferol, QUEtiapine, divalproex, and  simvastatin.   Meds ordered this encounter  Medications  . simvastatin (ZOCOR) 20 MG tablet    Sig: TAKE 1 TABLET(20 MG) BY MOUTH EVERY EVENING    Dispense:  90 tablet    Refill:  2    Order Specific Question:   Supervising Provider    Answer:   Crecencio Mc [[id number]]    Return precautions given.   Risks, benefits, and alternatives of the medications and treatment plan prescribed today were discussed, and patient expressed understanding.   Education regarding symptom management and diagnosis given to patient on AVS.  Continue to follow with Burnard Hawthorne, FNP for routine health maintenance.   Margarito Courser and I agreed with plan.   Mable Paris, FNP

## 2019-05-08 NOTE — Patient Instructions (Addendum)
Please ensure we order your bone density in 02/2020  Stay safe!

## 2019-05-08 NOTE — Assessment & Plan Note (Signed)
Chronic. Declined medication in the past. She will continue calcium, vitamin d. Advised repeat dexa 02/2020, patient will call the office so we can place order closer to time.

## 2019-05-11 DIAGNOSIS — E785 Hyperlipidemia, unspecified: Secondary | ICD-10-CM | POA: Diagnosis not present

## 2019-05-12 LAB — COMPREHENSIVE METABOLIC PANEL
ALT: 9 IU/L (ref 0–32)
AST: 16 IU/L (ref 0–40)
Albumin/Globulin Ratio: 1.4 (ref 1.2–2.2)
Albumin: 4.2 g/dL (ref 3.7–4.7)
Alkaline Phosphatase: 81 IU/L (ref 39–117)
BUN/Creatinine Ratio: 13 (ref 12–28)
BUN: 13 mg/dL (ref 8–27)
Bilirubin Total: 0.3 mg/dL (ref 0.0–1.2)
CO2: 24 mmol/L (ref 20–29)
Calcium: 9.5 mg/dL (ref 8.7–10.3)
Chloride: 102 mmol/L (ref 96–106)
Creatinine, Ser: 0.97 mg/dL (ref 0.57–1.00)
GFR calc Af Amer: 67 mL/min/{1.73_m2} (ref >59)
GFR calc non Af Amer: 58 mL/min/{1.73_m2} — ABNORMAL LOW (ref >59)
Globulin, Total: 2.9 g/dL (ref 1.5–4.5)
Glucose: 107 mg/dL — ABNORMAL HIGH (ref 65–99)
Potassium: 4.4 mmol/L (ref 3.5–5.2)
Sodium: 141 mmol/L (ref 134–144)
Total Protein: 7.1 g/dL (ref 6.0–8.5)

## 2019-05-12 LAB — LIPID PANEL
Chol/HDL Ratio: 3.3 ratio (ref 0.0–4.4)
Cholesterol, Total: 170 mg/dL (ref 100–199)
HDL: 51 mg/dL (ref >39)
LDL Chol Calc (NIH): 94 mg/dL (ref 0–99)
Triglycerides: 144 mg/dL (ref 0–149)
VLDL Cholesterol Cal: 25 mg/dL (ref 5–40)

## 2019-07-12 ENCOUNTER — Encounter: Payer: Self-pay | Admitting: Family

## 2019-08-16 ENCOUNTER — Telehealth (INDEPENDENT_AMBULATORY_CARE_PROVIDER_SITE_OTHER): Payer: Medicare Other | Admitting: Psychiatry

## 2019-08-16 ENCOUNTER — Other Ambulatory Visit: Payer: Self-pay

## 2019-08-16 ENCOUNTER — Encounter: Payer: Self-pay | Admitting: Psychiatry

## 2019-08-16 DIAGNOSIS — F3174 Bipolar disorder, in full remission, most recent episode manic: Secondary | ICD-10-CM | POA: Diagnosis not present

## 2019-08-16 DIAGNOSIS — Z79899 Other long term (current) drug therapy: Secondary | ICD-10-CM | POA: Diagnosis not present

## 2019-08-16 NOTE — Progress Notes (Deleted)
Patient ID: Rachel Macias, female   DOB: October 07, 1945, 74 y.o.   MRN: [id number]

## 2019-08-16 NOTE — Progress Notes (Signed)
Provider Location : [facility] Patient Location : Home  Virtual Visit via Telephone Note  I connected with Rachel Macias on 08/16/19 at 10:00 AM EDT by telephone and verified that I am speaking with the correct person using two identifiers.   I discussed the limitations, risks, security and privacy concerns of performing an evaluation and management service by telephone and the availability of in person appointments. I also discussed with the patient that there may be a patient responsible charge related to this service. The patient expressed understanding and agreed to proceed.   I discussed the assessment and treatment plan with the patient. The patient was provided an opportunity to ask questions and all were answered. The patient agreed with the plan and demonstrated an understanding of the instructions.   The patient was advised to call back or seek an in-person evaluation if the symptoms worsen or if the condition fails to improve as anticipated.   BH MD OP Progress Note  08/16/2019 12:42 PM Ayako Tapanes  MRN:  [id number]  Chief Complaint:  Chief Complaint    Follow-up     HPI: Rachel Macias is a 74 year old Caucasian female, widowed, lives in [place], has a history of bipolar disorder, insomnia was evaluated by phone today.  Patient preferred to do a phone call.  Patient today reports she is currently doing well.  She was able to get her COVID-19 vaccine and tolerated it well.  She reports now that she has more freedom she feels much better.  She reports she is compliant on her medications as prescribed.  She is on the Depakote and the Seroquel.  She denies side effects.  She reports sleep and appetite is good.  She denies any suicidality, homicidality or perceptual disturbances.  She does not feel comfortable tapering off of the Seroquel even though her symptoms are currently stable.  She reports she does not have anyone living in the same house who can monitor her closely as well as she  does not want to go back into that place when she ended up in the hospital with a flareup few years ago.    She continues to follow-up with her primary care provider and reports all her medical problems are under control.  Patient denies any other concerns today.  Visit Diagnosis:    ICD-10-CM   1. Bipolar disorder, in full remission, most recent episode manic (HCC)  F31.74   2. High risk medication use  Z79.899     Past Psychiatric History: I have reviewed past psychiatric history from my progress note on 06/10/2017.  Past trials of Prozac, lithium, Zyprexa  Past Medical History:  Past Medical History:  Diagnosis Date  . Bipolar 1 disorder (HCC) 1995   Dr. Caryn Section [phone])  . Ex-smoker 2001   minimal  . Osteoporosis 2015   fosamax reaction, then was on boniva IV for ~1 yr  . Postmenopausal    age 59  . Vitamin D deficiency     Past Surgical History:  Procedure Laterality Date  . CESAREAN SECTION    . DEXA  09/2013   T -2.8 femur  . TUBAL LIGATION     with reversal    Family Psychiatric History: I have reviewed family psychiatric history from my progress note on 06/10/2017  Family History:  Family History  Problem Relation Age of Onset  . Thyroid disease Other        runs in family  . Diabetes Brother   . Bipolar disorder Mother   .  Depression Mother   . Anxiety disorder Mother   . Stroke Neg Hx   . CAD Neg Hx   . Cancer Neg Hx     Social History: Reviewed social history from my progress note on 06/10/2017 Social History   Socioeconomic History  . Marital status: Widowed    Spouse name: Not on file  . Number of children: 1  . Years of education: Not on file  . Highest education level: Associate degree: occupational, Scientist, product/process development, or vocational program  Occupational History    Comment: retired  Tobacco Use  . Smoking status: Former Smoker    Quit date: 03/10/1999    Years since quitting: 20.4  . Smokeless tobacco: Never Used  Substance and Sexual  Activity  . Alcohol use: No  . Drug use: No  . Sexual activity: Not Currently  Other Topics Concern  . Not on file  Social History Narrative   Lives alone with 2 dogs, widow; husband died from brain cancer around 07-17-09.    Had lived in [place], [place]. Moved to [place] to be closer to family.    son died at age 81yo (hit by car).   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 Difficulty of Paying Living Expenses:   Food Insecurity:   . Worried About Programme researcher, broadcasting/film/video in the Last Year:   . Barista in the Last Year:   Transportation Needs:   . Freight forwarder (Medical):   Marland Kitchen Lack of Transportation (Non-Medical):   Physical Activity:   . Days of Exercise per Week:   . Minutes of Exercise per Session:   Stress:   . Feeling of Stress :   Social Connections:   . Frequency of Communication with Friends and Family:   . Frequency of Social Gatherings with Friends and Family:   . Attends Religious Services:   . Active Member of Clubs or Organizations:   . Attends Banker Meetings:   Marland Kitchen Marital Status:     Allergies:  Allergies  Allergen Reactions  . Alendronate Sodium Other (See Comments)    GI distress GI distress GI distress  . Amoxicillin Hives    Metabolic Disorder Labs: Lab Results  Component Value Date   HGBA1C 5.0 01/13/2018   Lab Results  Component Value Date   PROLACTIN 11.7 01/13/2018   PROLACTIN 10.8 04/16/2017   Lab Results  Component Value Date   CHOL 170 05/11/2019   TRIG 144 05/11/2019   HDL 51 05/11/2019   CHOLHDL 3.3 05/11/2019   VLDL 09.9 (H) 12/24/2015   LDLCALC 94 05/11/2019   LDLCALC 67 01/13/2018   Lab Results  Component Value Date   TSH 3.30 12/24/2015   TSH 2.93 10/12/2014     Therapeutic Level Labs: Lab Results  Component Value Date   LITHIUM 1.40 (H) 08/15/2016   LITHIUM 2.10 (HH) 08/15/2016   Lab Results  Component Value Date   VALPROATE 68 12/21/2018   VALPROATE 78 01/13/2018   No components found for:  CBMZ  Current Medications: Current Outpatient Medications  Medication Sig Dispense Refill  . cholecalciferol (VITAMIN D) 1000 UNITS tablet Take 2 tablets (2,000 Units total) by mouth daily.    . divalproex (DEPAKOTE  ER) 500 MG 24 hr tablet TAKE 2 TABLETS BY MOUTH AT  BEDTIME 180 tablet 3  . QUEtiapine (SEROQUEL) 200 MG tablet TAKE 1 TABLET BY MOUTH AT  BEDTIME 90 tablet 3  . simvastatin (ZOCOR) 20 MG tablet TAKE 1 TABLET(20 MG) BY MOUTH EVERY EVENING 90 tablet 2   No current facility-administered medications for this visit.     Musculoskeletal: Strength & Muscle Tone: UTA Gait & Station: UTA Patient leans: N/A  Psychiatric Specialty Exam: Review of Systems  Psychiatric/Behavioral: Negative for agitation, behavioral problems, confusion, decreased concentration, dysphoric mood, hallucinations, self-injury, sleep disturbance and suicidal ideas. The patient is not nervous/anxious and is not hyperactive.   All other systems reviewed and are negative.   There were no vitals taken for this visit.There is no height or weight on file to calculate BMI.  General Appearance: UTA  Eye Contact:  UTA  Speech:  Clear and Coherent  Volume:  Normal  Mood:  Euthymic  Affect:  UTA  Thought Process:  Goal Directed and Descriptions of Associations: Intact  Orientation:  Full (Time, Place, and Person)  Thought Content: Logical   Suicidal Thoughts:  No  Homicidal Thoughts:  No  Memory:  Immediate;   Fair Recent;   Fair Remote;   Fair  Judgement:  Fair  Insight:  Fair  Psychomotor Activity:  UTA  Concentration:  Concentration: Fair and Attention Span: Fair  Recall:  [employer] of Knowledge: Fair  Language: Fair  Akathisia:  No  Handed:  Right   AIMS (if indicated): UTA  Assets:  Communication Skills Desire for Improvement Housing Social Support  ADL's:  Intact  Cognition: WNL  Sleep:  Fair   Screenings: PHQ2-9     Office Visit from 05/08/2019 in [place] Visit from 05/29/2016 in [place] Office Visit from 02/12/2016 in [place] Office Visit from 09/14/2014 in [place] at [facility] Visit from 08/22/2013 in [place] at [facility] Total Score  0  0  0  0  0  PHQ-9 Total Score  3  --  --  --  --       Assessment and Plan: Oyuki Hogan is a 74 year old Caucasian female who has a history of bipolar disorder, history of lithium toxicity was evaluated by phone today.  Patient is currently stable on current medication regimen.  Plan as noted below.  Plan Bipolar disorder in remission Depakote ER 1000 mg p.o. nightly Depakote level on 12/21/2018-68-therapeutic. Seroquel 200 mg p.o. nightly.  Patient wants to stay on this current dosage and is not interested in tapering.  High risk medication use-I have reviewed and discussed most recent CMP-05/11/2019-within normal limits Patient will benefit from a repeat CBC with differential as well as Depakote level when she returns.  Follow-up in clinic in 3 months or sooner if needed.  I have spent atleast 20 minutes non face to face with patient today. More than 50 % of the time was spent for preparing to see the patient ( e.g., review of test, records ), ordering medications and test ,psychoeducation and supportive psychotherapy and care coordination,as well as documenting clinical information in electronic health record,interpreting results of test and communication of results This note was generated in part or whole with voice recognition software. Voice recognition is usually quite accurate but there are transcription errors that can and very often do occur. I apologize for any  typographical errors that were not detected  and corrected.        Jomarie Longs, MD 08/16/2019, 12:42 PM

## 2019-08-31 ENCOUNTER — Other Ambulatory Visit: Payer: Self-pay | Admitting: Psychiatry

## 2019-08-31 DIAGNOSIS — F3110 Bipolar disorder, current episode manic without psychotic features, unspecified: Secondary | ICD-10-CM

## 2019-11-04 ENCOUNTER — Encounter: Payer: Self-pay | Admitting: Family

## 2019-11-08 ENCOUNTER — Telehealth (INDEPENDENT_AMBULATORY_CARE_PROVIDER_SITE_OTHER): Payer: Medicare Other | Admitting: Family

## 2019-11-08 ENCOUNTER — Encounter: Payer: Self-pay | Admitting: Family

## 2019-11-08 DIAGNOSIS — E785 Hyperlipidemia, unspecified: Secondary | ICD-10-CM

## 2019-11-08 NOTE — Assessment & Plan Note (Signed)
LDL < 100. Continue crestor.

## 2019-11-08 NOTE — Progress Notes (Signed)
Verbal consent for services obtained from patient prior to services given to TELEPHONE visit:   Location of call:  provider at work patient at home  Names of all persons present for services: Rennie Plowman, NP and patient Chief complaint:  Feels well today No complaints. No fatigue.   HLD- compliant with zocor.   Bipolar- feels well controlled on seroquel and depakote.  Sleeping well.  continues to follow with dr Elna Breslow, last visit 08/2019.   Due tdap UTD mammogram, cologuard  History, background, results pertinent:    A/P/next steps: Problem List Items Addressed This Visit      Other   Dyslipidemia    LDL < 100. Continue crestor.          I spent 15 min  discussing plan of care over the phone.

## 2019-12-06 ENCOUNTER — Other Ambulatory Visit: Payer: Self-pay

## 2019-12-06 ENCOUNTER — Encounter: Payer: Self-pay | Admitting: Psychiatry

## 2019-12-06 ENCOUNTER — Telehealth (INDEPENDENT_AMBULATORY_CARE_PROVIDER_SITE_OTHER): Payer: Medicare Other | Admitting: Psychiatry

## 2019-12-06 DIAGNOSIS — F3174 Bipolar disorder, in full remission, most recent episode manic: Secondary | ICD-10-CM | POA: Diagnosis not present

## 2019-12-06 DIAGNOSIS — Z79899 Other long term (current) drug therapy: Secondary | ICD-10-CM | POA: Diagnosis not present

## 2019-12-06 NOTE — Progress Notes (Signed)
Provider Location : [facility] Patient Location : Home  Participants: Patient , Provider  Virtual Visit via Telephone Note  I connected with Rachel Macias on 12/06/19 at 10:00 AM EDT by telephone and verified that I am speaking with the correct person using two identifiers.   I discussed the limitations, risks, security and privacy concerns of performing an evaluation and management service by telephone and the availability of in person appointments. I also discussed with the patient that there may be a patient responsible charge related to this service. The patient expressed understanding and agreed to proceed.     I discussed the assessment and treatment plan with the patient. The patient was provided an opportunity to ask questions and all were answered. The patient agreed with the plan and demonstrated an understanding of the instructions.   The patient was advised to call back or seek an in-person evaluation if the symptoms worsen or if the condition fails to improve as anticipated.   BH MD OP Progress Note  12/06/2019 6:11 PM Rachel Macias  MRN:  [id number]  Chief Complaint:  Chief Complaint    Follow-up     HPI: Rachel Macias is a 74 year old Caucasian female, widowed, lives in [place], has a history of bipolar disorder, insomnia was evaluated by phone today.  Patient today reports she is currently doing well on the current medication regimen.  Patient denies any significant depression or mood lability.  She reports she is sleeping well.  She reports she is compliant on medications as prescribed.  She wants to stay on the Depakote and the Seroquel for now.  She is not interested in tapering down from the Seroquel at this time since her symptoms are currently stable and she is worried about what might happen if she tries to get off of it.  She reports her provider at her primary care office is currently keeping track of her metabolic panel including her hemoglobin A1c and lipid panel  and everything seems to be going well.  She reports she is reading a lot and that does help her a lot.  She also has been watching movies and recently went to the movies by herself which she truly enjoyed to her surprise.  Patient denies any suicidality, homicidality or perceptual disturbances.  Patient denies any other concerns right now.  Visit Diagnosis:    ICD-10-CM   1. Bipolar disorder, in full remission, most recent episode manic (HCC)  F31.74   2. High risk medication use  Z79.899 Valproic acid level    CBC With Differential    Past Psychiatric History: I have reviewed past psychiatric history from my progress note on 06/10/2017.  Past trials of Prozac, lithium, Zyprexa  Past Medical History:  Past Medical History:  Diagnosis Date  . Bipolar 1 disorder (HCC) 1995   Dr. Caryn Section [phone])  . Ex-smoker 2001   minimal  . Osteoporosis 2015   fosamax reaction, then was on boniva IV for ~1 yr  . Postmenopausal    age 82  . Vitamin D deficiency     Past Surgical History:  Procedure Laterality Date  . CESAREAN SECTION    . DEXA  09/2013   T -2.8 femur  . TUBAL LIGATION     with reversal    Family Psychiatric History: I have reviewed family psychiatric history from my progress note on 06/10/2017  Family History:  Family History  Problem Relation Age of Onset  . Thyroid disease Other  runs in family  . Diabetes Brother   . Bipolar disorder Mother   . Depression Mother   . Anxiety disorder Mother   . Stroke Neg Hx   . CAD Neg Hx   . Cancer Neg Hx     Social History: Reviewed social history from my progress note on 06/10/2017 Social History   Socioeconomic History  . Marital status: Widowed    Spouse name: Not on file  . Number of children: 1  . Years of education: Not on file  . Highest education level: Associate degree: occupational, Scientist, product/process development, or vocational program  Occupational History    Comment: retired  Tobacco Use  . Smoking status: Former  Smoker    Quit date: 03/10/1999    Years since quitting: 20.7  . Smokeless tobacco: Never Used  Vaping Use  . Vaping Use: Never used  Substance and Sexual Activity  . Alcohol use: No  . Drug use: No  . Sexual activity: Not Currently  Other Topics Concern  . Not on file  Social History Narrative   Lives alone with 2 dogs, widow; husband died from brain cancer around 07/04/09.    Had lived in [place], [place]. Moved to [place] to be closer to family.    son died at age 72yo (hit by car).   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 Difficulty of Paying Living Expenses: Not on file  Food Insecurity:   . Worried About Programme researcher, broadcasting/film/video in the Last Year: Not on file  . Ran Out of Food in the Last Year: Not on file  Transportation Needs:   . Lack of Transportation (Medical): Not on file  . Lack of Transportation (Non-Medical): Not on file  Physical Activity:   . Days of Exercise per Week: Not on file  . Minutes of Exercise per Session: Not on file  Stress:   . Feeling of Stress : Not on file  Social Connections:   . Frequency of Communication with Friends and Family: Not on file  . Frequency of Social Gatherings with Friends and Family: Not on file  . Attends Religious Services: Not on file  . Active Member of Clubs or Organizations: Not on file  . Attends Banker Meetings: Not on file  . Marital Status: Not on file    Allergies:  Allergies  Allergen Reactions  . Alendronate Sodium Other (See Comments)    GI distress GI distress GI distress  . Amoxicillin Hives    Metabolic Disorder Labs: Lab Results  Component Value Date   HGBA1C 5.0 01/13/2018   Lab Results  Component Value Date   PROLACTIN 11.7 01/13/2018   PROLACTIN 10.8  04/16/2017   Lab Results  Component Value Date   CHOL 170 05/11/2019   TRIG 144 05/11/2019   HDL 51 05/11/2019   CHOLHDL 3.3 05/11/2019   VLDL 60.6 (H) 12/24/2015   LDLCALC 94 05/11/2019   LDLCALC 67 01/13/2018   Lab Results  Component Value Date   TSH 3.30 12/24/2015   TSH 2.93 10/12/2014    Therapeutic Level Labs: Lab Results  Component Value Date   LITHIUM 1.40 (H) 08/15/2016   LITHIUM 2.10 (HH) 08/15/2016   Lab Results  Component Value  Date   VALPROATE 68 12/21/2018   VALPROATE 78 01/13/2018   No components found for:  CBMZ  Current Medications: Current Outpatient Medications  Medication Sig Dispense Refill  . cholecalciferol (VITAMIN D) 1000 UNITS tablet Take 2 tablets (2,000 Units total) by mouth daily.    . divalproex (DEPAKOTE ER) 500 MG 24 hr tablet TAKE 2 TABLETS BY MOUTH AT  BEDTIME 180 tablet 3  . QUEtiapine (SEROQUEL) 200 MG tablet TAKE 1 TABLET BY MOUTH AT  BEDTIME 90 tablet 3  . simvastatin (ZOCOR) 20 MG tablet TAKE 1 TABLET(20 MG) BY MOUTH EVERY EVENING 90 tablet 2   No current facility-administered medications for this visit.     Musculoskeletal: Strength & Muscle Tone: UTA Gait & Station: UTA Patient leans: N/A  Psychiatric Specialty Exam: Review of Systems  Psychiatric/Behavioral: Negative for agitation, behavioral problems, confusion, decreased concentration, dysphoric mood, hallucinations, self-injury, sleep disturbance and suicidal ideas. The patient is not nervous/anxious and is not hyperactive.   All other systems reviewed and are negative.   There were no vitals taken for this visit.There is no height or weight on file to calculate BMI.  General Appearance: UTA  Eye Contact:  UTA  Speech:  Clear and Coherent  Volume:  Normal  Mood:  Euthymic  Affect:  UTA  Thought Process:  Goal Directed and Descriptions of Associations: Intact  Orientation:  Full (Time, Place, and Person)  Thought Content: Logical   Suicidal Thoughts:  No   Homicidal Thoughts:  No  Memory:  Immediate;   Fair Recent;   Fair Remote;   Fair  Judgement:  Fair  Insight:  Fair  Psychomotor Activity:  UTA  Concentration:  Concentration: Fair and Attention Span: Fair  Recall:  [employer] of Knowledge: Fair  Language: Fair  Akathisia:  No  Handed:  Right  AIMS (if indicated): UTA  Assets:  Communication Skills Desire for Improvement Housing Social Support  ADL's:  Intact  Cognition: WNL  Sleep:  Fair   Screenings: PHQ2-9     Office Visit from 05/08/2019 in [facility] Office Visit from 05/29/2016 in [facility] [place] [facility] Visit from 02/12/2016 in [facility] Visit from 09[facility]14/2014 in [facility] Visit from 08/22/2013 in [facility] at [facility] Total Score 0 0 0 0 0  PHQ-9 Total Score 3 -- -- -- --       Assessment and Plan: Rachel Macias is a 74 year old Caucasian female who has a history of bipolar disorder, history of lithium toxicity was evaluated by phone today.  Patient is currently stable on current medication regimen.  Plan as noted below.  Plan Bipolar disorder in remission Depakote ER 1000 mg p.o. nightly Depakote level on 12/21/2018-68-therapeutic Seroquel 200 mg p.o. nightly.  She is not interested in tapering off and wants to stay on this medication at this time.  She is aware about the long-term risk and her primary care doctor is monitoring her metabolic panel.  High risk medication use-will order Depakote level, CBC with differential.  Will mail the lab slip to her per patient preference.  Follow-up in clinic in 3 months or sooner if needed.  I have spent atleast 20 minutes non face to face with patient today. More than 50 % of the time was spent for preparing to see the patient ( e.g., review of test, records ),  ordering medications and test ,psychoeducation and supportive psychotherapy and care coordination,as well  as documenting clinical information in electronic health record. This note was generated in part or whole with voice recognition software. Voice recognition is usually quite accurate but there are transcription errors that can and very often do occur. I apologize for any typographical errors that were not detected and corrected.       Jomarie LongsSaramma Daryle Boyington, MD 12/06/2019, 6:11 PM

## 2019-12-08 DIAGNOSIS — Z23 Encounter for immunization: Secondary | ICD-10-CM | POA: Diagnosis not present

## 2019-12-10 ENCOUNTER — Encounter: Payer: Self-pay | Admitting: Family

## 2019-12-14 ENCOUNTER — Other Ambulatory Visit: Payer: Self-pay | Admitting: Family

## 2019-12-14 DIAGNOSIS — E785 Hyperlipidemia, unspecified: Secondary | ICD-10-CM

## 2019-12-22 DIAGNOSIS — Z79899 Other long term (current) drug therapy: Secondary | ICD-10-CM | POA: Diagnosis not present

## 2019-12-23 LAB — CBC WITH DIFFERENTIAL
Basophils Absolute: 0 10*3/uL (ref 0.0–0.2)
Basos: 1 %
EOS (ABSOLUTE): 0.1 10*3/uL (ref 0.0–0.4)
Eos: 2 %
Hematocrit: 41.6 % (ref 34.0–46.6)
Hemoglobin: 14.5 g/dL (ref 11.1–15.9)
Immature Grans (Abs): 0 10*3/uL (ref 0.0–0.1)
Immature Granulocytes: 0 %
Lymphocytes Absolute: 3 10*3/uL (ref 0.7–3.1)
Lymphs: 49 %
MCH: 33.6 pg — ABNORMAL HIGH (ref 26.6–33.0)
MCHC: 34.9 g/dL (ref 31.5–35.7)
MCV: 96 fL (ref 79–97)
Monocytes Absolute: 0.5 10*3/uL (ref 0.1–0.9)
Monocytes: 8 %
Neutrophils Absolute: 2.4 10*3/uL (ref 1.4–7.0)
Neutrophils: 40 %
RBC: 4.32 x10E6/uL (ref 3.77–5.28)
RDW: 11.8 % (ref 11.7–15.4)
WBC: 6.1 10*3/uL (ref 3.4–10.8)

## 2019-12-23 LAB — VALPROIC ACID LEVEL: Valproic Acid Lvl: 82 ug/mL (ref 50–100)

## 2020-01-19 ENCOUNTER — Ambulatory Visit (INDEPENDENT_AMBULATORY_CARE_PROVIDER_SITE_OTHER): Payer: Medicare Other

## 2020-01-19 VITALS — Ht 64.0 in | Wt 157.0 lb

## 2020-01-19 DIAGNOSIS — Z0000 Encounter for general adult medical examination without abnormal findings: Secondary | ICD-10-CM

## 2020-01-19 NOTE — Patient Instructions (Addendum)
Rachel Macias , Thank you for taking time to come for your Medicare Wellness Visit. I appreciate your ongoing commitment to your health goals. Please review the following plan we discussed and let me know if I can assist you in the future.   These are the goals we discussed: Goals      Patient Stated   .  Increase physical activity (pt-stated)      I will walk more for exercise       This is a list of the screening recommended for you and due dates:  Health Maintenance  Topic Date Due  . Tetanus Vaccine  Never done  . Mammogram  02/20/2021  . Cologuard (Stool DNA test)  01/30/2022  . Flu Shot  Completed  . DEXA scan (bone density measurement)  Completed  . COVID-19 Vaccine  Completed  .  Hepatitis C: One time screening is recommended by Center for Disease Control  (CDC) for  adults born from [birth year] through 1965.   Completed  . Pneumonia vaccines  Completed    Immunizations Immunization History  Administered Date(s) Administered  . Influenza, High Dose Seasonal PF 12/12/2015, 01/04/2017, 01/12/2018, 12/19/2018  . Influenza-Unspecified 01/07/2013, 12/08/2019  . PFIZER SARS-COV-2 Vaccination 05/10/2019, 05/31/2019, 12/08/2019  . Pneumococcal Conjugate-13 08/22/2013  . Pneumococcal Polysaccharide-23 09/14/2014   Keep all routine maintenance appointments.   Follow up 05/08/20 @ 10:30  Advanced directives: completed, on file  Conditions/risks identified: none new  Follow up in one year for your annual wellness visit.   Preventive Care 1 Years and Older, Female Preventive care refers to lifestyle choices and visits with your health care provider that can promote health and wellness. What does preventive care include?  A yearly physical exam. This is also called an annual well check.  Dental exams once or twice a year.  Routine eye exams. Ask your health care provider how often you should have your eyes checked.  Personal lifestyle choices, including:  Daily care of your  teeth and gums.  Regular physical activity.  Eating a healthy diet.  Avoiding tobacco and drug use.  Limiting alcohol use.  Practicing safe sex.  Taking low-dose aspirin every day.  Taking vitamin and mineral supplements as recommended by your health care provider. What happens during an annual well check? The services and screenings done by your health care provider during your annual well check will depend on your age, overall health, lifestyle risk factors, and family history of disease. Counseling  Your health care provider may ask you questions about your:  Alcohol use.  Tobacco use.  Drug use.  Emotional well-being.  Home and relationship well-being.  Sexual activity.  Eating habits.  History of falls.  Memory and ability to understand (cognition).  Work and work Astronomer.  Reproductive health. Screening  You may have the following tests or measurements:  Height, weight, and BMI.  Blood pressure.  Lipid and cholesterol levels. These may be checked every 5 years, or more frequently if you are over 78 years old.  Skin check.  Lung cancer screening. You may have this screening every year starting at age 27 if you have a 30-pack-year history of smoking and currently smoke or have quit within the past 15 years.  Fecal occult blood test (FOBT) of the stool. You may have this test every year starting at age 59.  Flexible sigmoidoscopy or colonoscopy. You may have a sigmoidoscopy every 5 years or a colonoscopy every 10 years starting at age 77.  Hepatitis C  blood test.  Hepatitis B blood test.  Sexually transmitted disease (STD) testing.  Diabetes screening. This is done by checking your blood sugar (glucose) after you have not eaten for a while (fasting). You may have this done every 1-3 years.  Bone density scan. This is done to screen for osteoporosis. You may have this done starting at age 70.  Mammogram. This may be done every 1-2 years. Talk  to your health care provider about how often you should have regular mammograms. Talk with your health care provider about your test results, treatment options, and if necessary, the need for more tests. Vaccines  Your health care provider may recommend certain vaccines, such as:  Influenza vaccine. This is recommended every year.  Tetanus, diphtheria, and acellular pertussis (Tdap, Td) vaccine. You may need a Td booster every 10 years.  Zoster vaccine. You may need this after age 15.  Pneumococcal 13-valent conjugate (PCV13) vaccine. One dose is recommended after age 67.  Pneumococcal polysaccharide (PPSV23) vaccine. One dose is recommended after age 33. Talk to your health care provider about which screenings and vaccines you need and how often you need them. This information is not intended to replace advice given to you by your health care provider. Make sure you discuss any questions you have with your health care provider. Document Released: 03/22/2015 Document Revised: 11/13/2015 Document Reviewed: 12/25/2014 Elsevier Interactive Patient Education  2017 [employer].  Fall Prevention in the Home Falls can cause injuries. They can happen to people of all ages. There are many things you can do to make your home safe and to help prevent falls. What can I do on the outside of my home?  Regularly fix the edges of walkways and driveways and fix any cracks.  Remove anything that might make you trip as you walk through a door, such as a raised step or threshold.  Trim any bushes or trees on the path to your home.  Use bright outdoor lighting.  Clear any walking paths of anything that might make someone trip, such as rocks or tools.  Regularly check to see if handrails are loose or broken. Make sure that both sides of any steps have handrails.  Any raised decks and porches should have guardrails on the edges.  Have any leaves, snow, or ice cleared regularly.  Use sand or salt on  walking paths during winter.  Clean up any spills in your garage right away. This includes oil or grease spills. What can I do in the bathroom?  Use night lights.  Install grab bars by the toilet and in the tub and shower. Do not use towel bars as grab bars.  Use non-skid mats or decals in the tub or shower.  If you need to sit down in the shower, use a plastic, non-slip stool.  Keep the floor dry. Clean up any water that spills on the floor as soon as it happens.  Remove soap buildup in the tub or shower regularly.  Attach bath mats securely with double-sided non-slip rug tape.  Do not have throw rugs and other things on the floor that can make you trip. What can I do in the bedroom?  Use night lights.  Make sure that you have a light by your bed that is easy to reach.  Do not use any sheets or blankets that are too big for your bed. They should not hang down onto the floor.  Have a firm chair that has side arms. You  can use this for support while you get dressed.  Do not have throw rugs and other things on the floor that can make you trip. What can I do in the kitchen?  Clean up any spills right away.  Avoid walking on wet floors.  Keep items that you use a lot in easy-to-reach places.  If you need to reach something above you, use a strong step stool that has a grab bar.  Keep electrical cords out of the way.  Do not use floor polish or wax that makes floors slippery. If you must use wax, use non-skid floor wax.  Do not have throw rugs and other things on the floor that can make you trip. What can I do with my stairs?  Do not leave any items on the stairs.  Make sure that there are handrails on both sides of the stairs and use them. Fix handrails that are broken or loose. Make sure that handrails are as long as the stairways.  Check any carpeting to make sure that it is firmly attached to the stairs. Fix any carpet that is loose or worn.  Avoid having throw  rugs at the top or bottom of the stairs. If you do have throw rugs, attach them to the floor with carpet tape.  Make sure that you have a light switch at the top of the stairs and the bottom of the stairs. If you do not have them, ask someone to add them for you. What else can I do to help prevent falls?  Wear shoes that:  Do not have high heels.  Have rubber bottoms.  Are comfortable and fit you well.  Are closed at the toe. Do not wear sandals.  If you use a stepladder:  Make sure that it is fully opened. Do not climb a closed stepladder.  Make sure that both sides of the stepladder are locked into place.  Ask someone to hold it for you, if possible.  Clearly mark and make sure that you can see:  Any grab bars or handrails.  First and last steps.  Where the edge of each step is.  Use tools that help you move around (mobility aids) if they are needed. These include:  Canes.  Walkers.  Scooters.  Crutches.  Turn on the lights when you go into a dark area. Replace any light bulbs as soon as they burn out.  Set up your furniture so you have a clear path. Avoid moving your furniture around.  If any of your floors are uneven, fix them.  If there are any pets around you, be aware of where they are.  Review your medicines with your doctor. Some medicines can make you feel dizzy. This can increase your chance of falling. Ask your doctor what other things that you can do to help prevent falls. This information is not intended to replace advice given to you by your health care provider. Make sure you discuss any questions you have with your health care provider. Document Released: 12/20/2008 Document Revised: 08/01/2015 Document Reviewed: 03/30/2014 Elsevier Interactive Patient Education  2017 [employer].

## 2020-01-19 NOTE — Progress Notes (Addendum)
Subjective:   Rachel Macias is a 74 y.o. female who presents for Medicare Annual (Subsequent) preventive examination.  Review of Systems    No ROS.  Medicare Wellness Virtual Visit.   Cardiac Risk Factors include: advanced age (>55men, >57 women)     Objective:    Today's Vitals   01/19/20 1040  Weight: 157 lb (71.2 kg)  Height: 5\' 4"  (1.626 m)   Body mass index is 26.95 kg/m.  Advanced Directives 01/19/2020 01/18/2019 01/12/2018 01/04/2017  Does Patient Have a Medical Advance Directive? Yes Yes Yes Yes  Type of 01/06/2017 of [place];Living will Healthcare Power of [place];Living will Healthcare Power of [place];Living will Healthcare Power of Attorney  Does patient want to make changes to medical advance directive? No - Patient declined No - Patient declined No - Patient declined No - Patient declined  Copy of Healthcare Power of Attorney in Chart? Yes - validated most recent copy scanned in chart (See row information) Yes - validated most recent copy scanned in chart (See row information) Yes - validated most recent copy scanned in chart (See row information) No - copy requested  Some encounter information is confidential and restricted. Go to Review Flowsheets activity to see all data.    Current Medications (verified) Outpatient Encounter Medications as of 01/19/2020  Medication Sig  . cholecalciferol (VITAMIN D) 1000 UNITS tablet Take 2 tablets (2,000 Units total) by mouth daily.  . divalproex (DEPAKOTE ER) 500 MG 24 hr tablet TAKE 2 TABLETS BY MOUTH AT  BEDTIME  . QUEtiapine (SEROQUEL) 200 MG tablet TAKE 1 TABLET BY MOUTH AT  BEDTIME  . simvastatin (ZOCOR) 20 MG tablet TAKE 1 TABLET BY MOUTH IN  THE EVENING   No facility-administered encounter medications on file as of 01/19/2020.    Allergies (verified) Alendronate sodium and Amoxicillin   History: Past Medical History:  Diagnosis Date  . Bipolar 1 disorder (HCC) 1995   Dr. 13/02/2020  [phone])  . Ex-smoker 2001   minimal  . Osteoporosis 2015   fosamax reaction, then was on boniva IV for ~1 yr  . Postmenopausal    age 1  . Vitamin D deficiency    Past Surgical History:  Procedure Laterality Date  . CESAREAN SECTION    . DEXA  09/2013   T -2.8 femur  . TUBAL LIGATION     with reversal   Family History  Problem Relation Age of Onset  . Thyroid disease Other        runs in family  . Diabetes Brother   . Bipolar disorder Mother   . Depression Mother   . Anxiety disorder Mother   . Stroke Neg Hx   . CAD Neg Hx   . Cancer Neg Hx    Social History   Socioeconomic History  . Marital status: Widowed    Spouse name: Not on file  . Number of children: 1  . Years of education: Not on file  . Highest education level: Associate degree: occupational, 10/2013, or vocational program  Occupational History    Comment: retired  Tobacco Use  . Smoking status: Former Smoker    Quit date: 03/10/1999    Years since quitting: 20.8  . Smokeless tobacco: Never Used  Vaping Use  . Vaping Use: Never used  Substance and Sexual Activity  . Alcohol use: No  . Drug use: No  . Sexual activity: Not Currently  Other Topics Concern  . Not on file  Social History  Narrative   Lives alone with 2 dogs, widow; husband died from brain cancer around 07-01-2009.    Had lived in [place], [place]. Moved to [place] to be closer to family.    son died at age 10yo (hit by car).   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 Difficulty of Paying Living Expenses: Not hard at all  Food Insecurity: No Food Insecurity  . Worried About Programme researcher, broadcasting/film/video in the Last Year: Never true  . Ran Out of Food in the Last Year: Never true  Transportation Needs: No  Transportation Needs  . Lack of Transportation (Medical): No  . Lack of Transportation (Non-Medical): No  Physical Activity:   . Days of Exercise per Week: Not on file  . Minutes of Exercise per Session: Not on file  Stress: No Stress Concern Present  . Feeling of Stress : Not at all  Social Connections:   . Frequency of Communication with Friends and Family: Not on file  . Frequency of Social Gatherings with Friends and Family: Not on file  . Attends Religious Services: Not on file  . Active Member of Clubs or Organizations: Not on file  . Attends Banker Meetings: Not on file  . Marital Status: Not on file    Tobacco Counseling Counseling given: Not Answered   Clinical Intake:  Pre-visit preparation completed: Yes        Diabetes: No  How often do you need to have someone help you when you read instructions, pamphlets, or other written materials from your doctor or pharmacy?: 1 - Never  Interpreter Needed?: No      Activities of Daily Living In your present state of health, do you have any difficulty performing the following activities: 01/19/2020  Hearing? N  Vision? N  Difficulty concentrating or making decisions? N  Walking or climbing stairs? N  Dressing or bathing? N  Doing errands, shopping? N  Preparing Food and eating ? N  Using the Toilet? N  In the past six months, have you accidently leaked urine? N  Do you have problems with loss of bowel control? N  Managing your Medications? N  Managing your Finances? N  Housekeeping or managing your Housekeeping? N  Some recent data might be hidden    Patient Care Team: Allegra Grana, FNP as PCP - General (Family Medicine)  Indicate any recent Medical Services you may have received from other than [facility] providers in the past year (date may be approximate).     Assessment:   This is a routine wellness examination for Rachel Macias.  I connected with Artelia today by telephone and verified that I am  speaking with the correct person using two identifiers. Location patient: home Location provider: work Persons participating in the virtual visit: patient, Engineer, civil (consulting).    I discussed the limitations, risks, security and privacy concerns of performing an evaluation and management service by telephone and the availability of in person appointments. The patient expressed understanding and verbally consented to this telephonic visit.    Interactive audio and video telecommunications were attempted between this provider and patient, however failed, due to patient having technical difficulties OR patient did not have access to video capability.  We continued and completed visit with audio only.  Some vital signs may be absent or patient reported.   Hearing/Vision screen  Hearing Screening   125Hz  250Hz  500Hz  1000Hz  2000Hz  3000Hz  4000Hz  6000Hz  8000Hz   Right ear:           Left ear:           Comments: Patient is able to hear conversational tones without difficulty. No issues reported.  Vision Screening Comments: Wears corrective lenses when reading  They have regular follow up with the ophthalmologist  Dietary issues and exercise activities discussed: Current Exercise Habits: Home exercise routine, Type of exercise: walking, Intensity: Mild  Healthy diet Good water intake  Goals      Patient Stated   .  Increase physical activity (pt-stated)      I will walk more for exercise      Depression Screen PHQ 2/9 Scores 01/19/2020 05/08/2019 01/18/2019 01/12/2018 01/04/2017 05/29/2016 02/12/2016  PHQ - 2 Score - 0 - - - 0 0  PHQ- 9 Score - 3 - - - - -  Exception Documentation Other- indicate reason in comment box - Other- indicate reason in comment box Other- indicate reason in comment box Other- indicate reason in comment box - -  Not completed - - - Psychiatrist visits every 3 months Currently in treatment with Dr.Braden ([facility]) - -    Fall Risk Fall Risk  01/19/2020 05/08/2019 02/01/2019 01/18/2019  01/24/2018  Falls in the past year? 0 0 0 0 0  Comment - - [place] Telephone Survey: data to providers prior to load - [place] Telephone Survey: data to providers prior to load  Number falls in past yr: 0 - - - -  Injury with Fall? 0 - - - -  Follow up Falls evaluation completed Falls evaluation completed - - -   Handrails in use when climbing stairs? Yes Home free of loose throw rugs in walkways, pet beds, electrical cords, etc? Yes  Adequate lighting in your home to reduce risk of falls? Yes   ASSISTIVE DEVICES UTILIZED TO PREVENT FALLS: Use of a cane, walker or w/c? No   TIMED UP AND GO: Was the test performed? No . Virtual visit.   Cognitive Function: Patient is alert and oriented x3.  Denies difficulty focusing, making decisions, memory loss.  Enjoys reading for brain health.  MMSE/6CIT deferred. Normal by direct communication/observation.   MMSE - Mini Mental State Exam 01/04/2017  Not completed: Refused     6CIT Screen 01/18/2019 01/12/2018  What Year? 0 points 0 points  What month? 0 points 0 points  What time? 0 points 0 points  Count back from 20 0 points 0 points  Months in reverse 0 points 0 points  Repeat phrase 0 points 0 points  Total Score 0 0   Immunizations Immunization History  Administered Date(s) Administered  . Influenza, High Dose Seasonal PF 12/12/2015, 01/04/2017, 01/12/2018, 12/19/2018  . Influenza-Unspecified 01/07/2013, 12/08/2019  . PFIZER SARS-COV-2 Vaccination 05/10/2019, 05/31/2019, 12/08/2019  . Pneumococcal Conjugate-13 08/22/2013  . Pneumococcal Polysaccharide-23 09/14/2014    TDAP status: Due, Education has been provided regarding the importance of this vaccine. Advised may receive this vaccine at local pharmacy or Health Dept. Aware to provide a copy of the vaccination record if obtained from local pharmacy or Health Dept. Verbalized acceptance and understanding. Deferred.   Health Maintenance Health Maintenance  Topic Date Due  .  TETANUS/TDAP  Never done  . MAMMOGRAM  02/20/2021  . Fecal DNA (Cologuard)  01/30/2022  . INFLUENZA VACCINE  Completed  . DEXA SCAN  Completed  . COVID-19 Vaccine  Completed  . Hepatitis C Screening  Completed  . PNA vac Low Risk Adult  Completed   Cologuard- 01/31/19. Repeat every 3 years.   Mammogram status: Completed 02/21/19. Repeat every year. 3D Screen. Bilateral.  Bone density- 02/22/18. Repeat every 2 years.   Lung Cancer Screening: (Low Dose CT Chest recommended if Age 52-80 years, 30 pack-year currently smoking OR have quit w/in 15years.) does not qualify.   Hepatitis C Screening: Completed 12/24/15  Dental Screening: Recommended annual dental exams for proper oral hygiene.Visits every 6 months.   Community Resource Referral / Chronic Care Management: CRR required this visit?  No   CCM required this visit?  No      Plan:   Keep all routine maintenance appointments.   Follow up 05/08/20 @ 10:30  I have personally reviewed and noted the following in the patient's chart:   . Medical and social history . Use of alcohol, tobacco or illicit drugs  . Current medications and supplements . Functional ability and status . Nutritional status . Physical activity . Advanced directives . List of other physicians . Hospitalizations, surgeries, and ER visits in previous 12 months . Vitals . Screenings to include cognitive, depression, and falls . Referrals and appointments  In addition, I have reviewed and discussed with patient certain preventive protocols, quality metrics, and best practice recommendations. A written personalized care plan for preventive services as well as general preventive health recommendations were provided to patient via mychart.     Ashok Pall, LPN   [id number]    Agree with plan. Rennie Plowman, NP

## 2020-02-09 ENCOUNTER — Other Ambulatory Visit: Payer: Self-pay | Admitting: Family

## 2020-02-09 DIAGNOSIS — Z1231 Encounter for screening mammogram for malignant neoplasm of breast: Secondary | ICD-10-CM

## 2020-02-22 ENCOUNTER — Other Ambulatory Visit: Payer: Self-pay

## 2020-02-22 ENCOUNTER — Ambulatory Visit
Admission: RE | Admit: 2020-02-22 | Discharge: 2020-02-22 | Disposition: A | Discharge status: Home / Self Care | Payer: Medicare Other | Source: Ambulatory Visit | Attending: Family | Admitting: Family

## 2020-02-22 DIAGNOSIS — Z1231 Encounter for screening mammogram for malignant neoplasm of breast: Secondary | ICD-10-CM | POA: Insufficient documentation

## 2020-02-22 IMAGING — MG DIGITAL SCREENING BILAT W/ TOMO W/ CAD
8 series · 8 of 24 positions shown · non-contrast
Comparison: Previous exam(s).

CLINICAL DATA: Screening.

EXAM:
DIGITAL SCREENING BILATERAL MAMMOGRAM WITH TOMO AND CAD

[R MLO synth-2D]
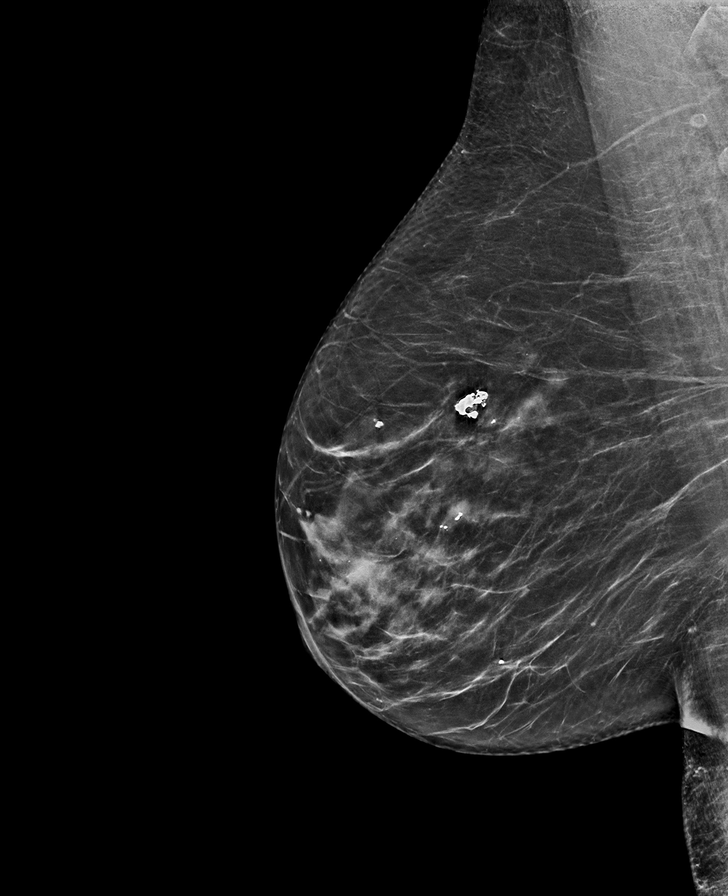

[L MLO synth-2D]
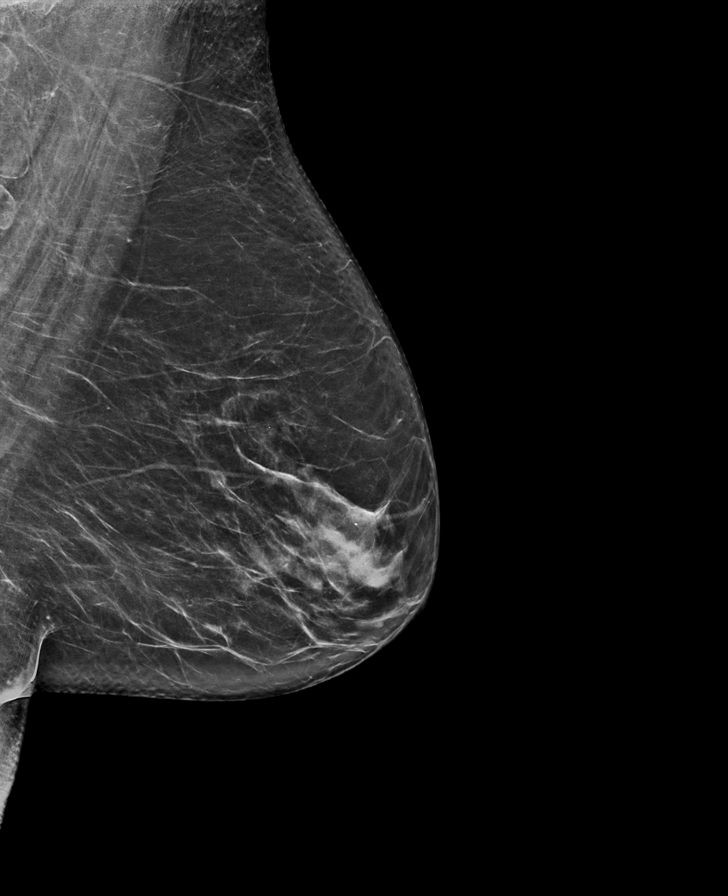

[R CC synth-2D]
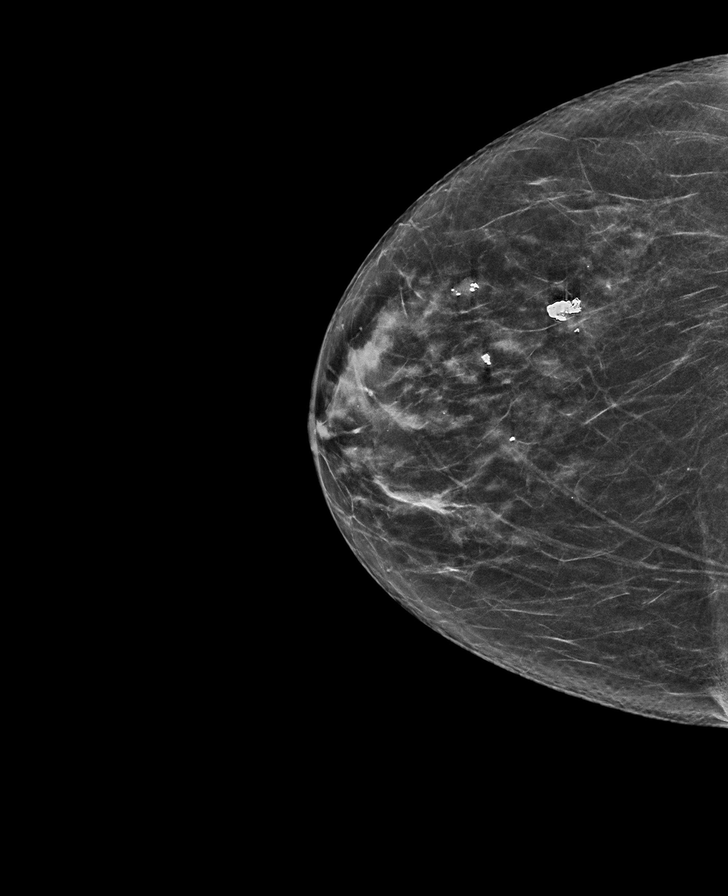

[L CC synth-2D]
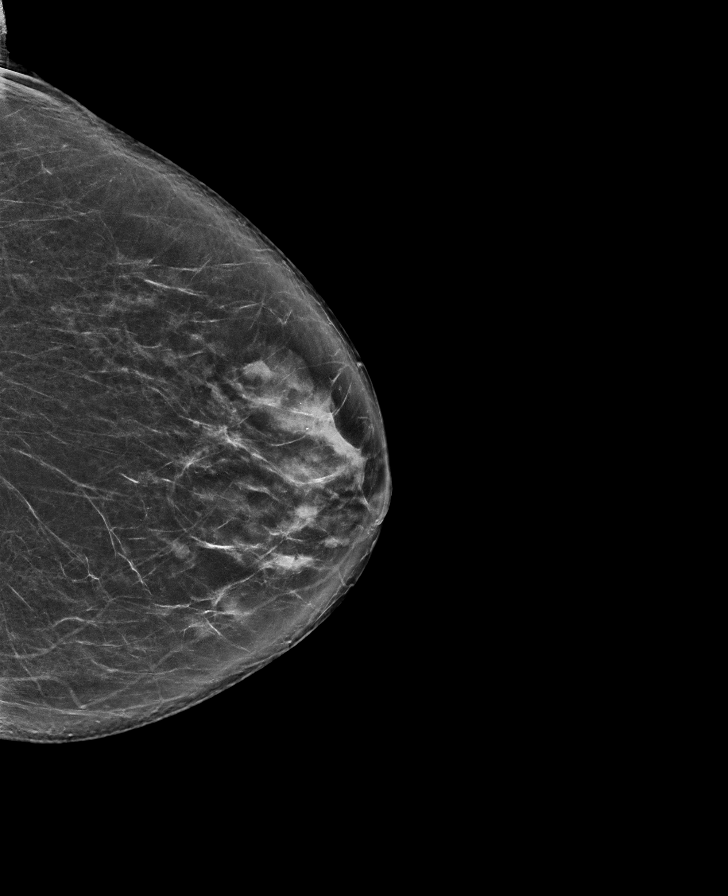

[R CC tomo · tomo slice 32/63.0]
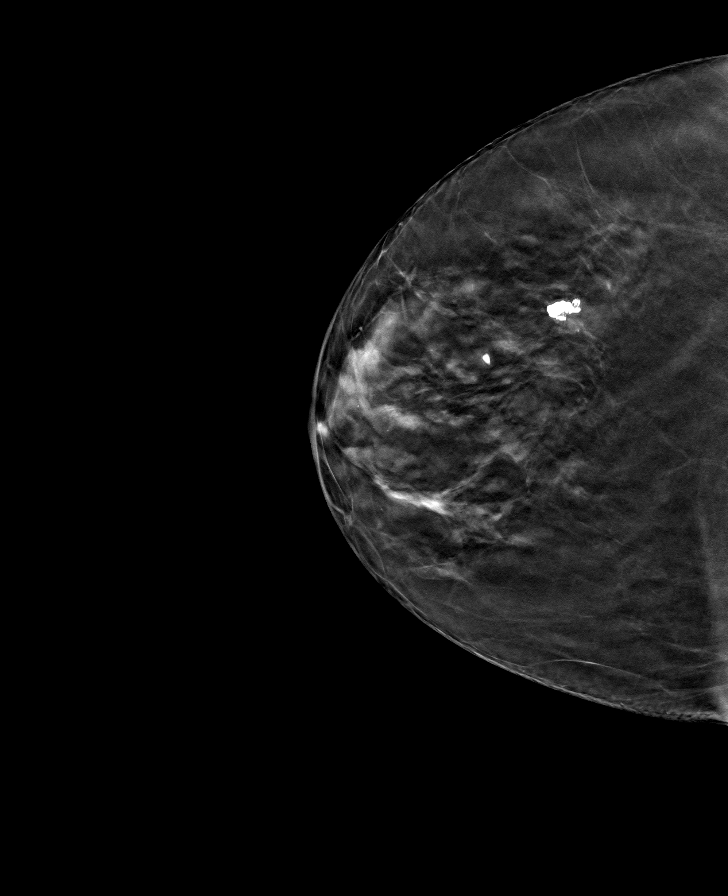

[L MLO tomo · tomo slice 37/73.0]
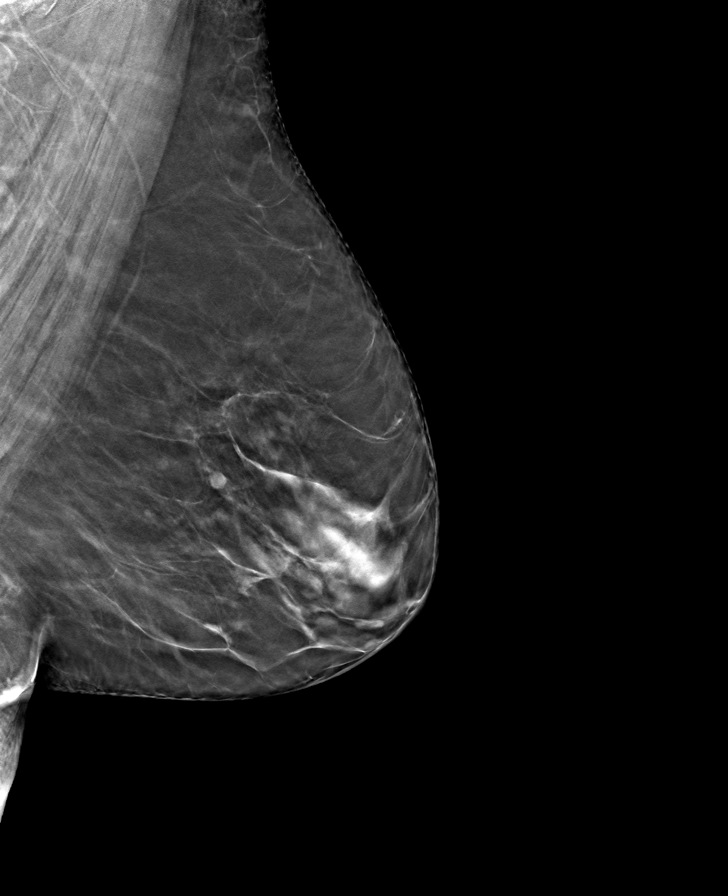

[R MLO tomo · tomo slice 35/68.0]
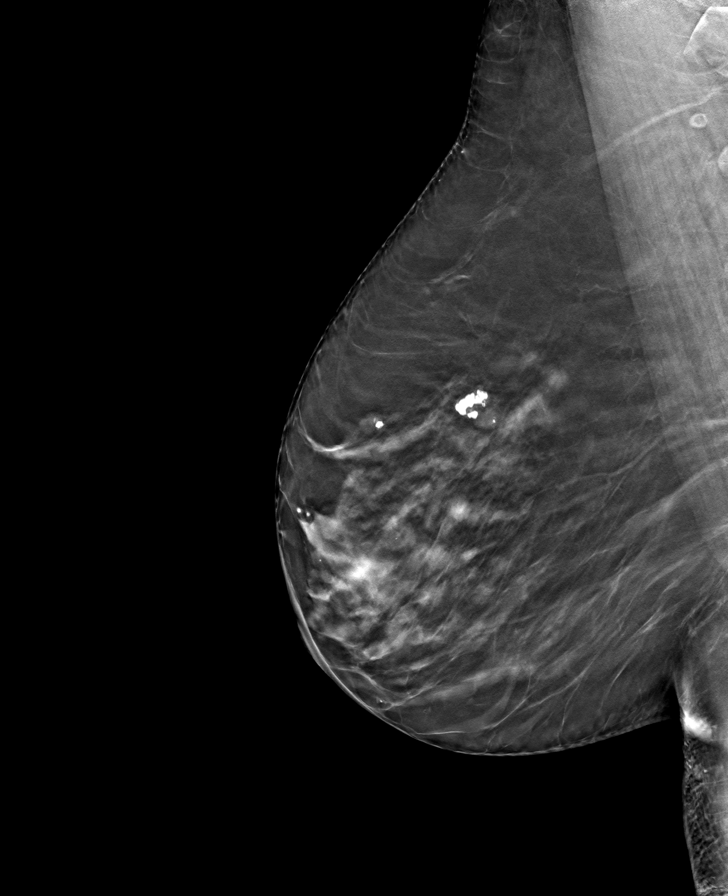

[L CC tomo · tomo slice 40/79.0]
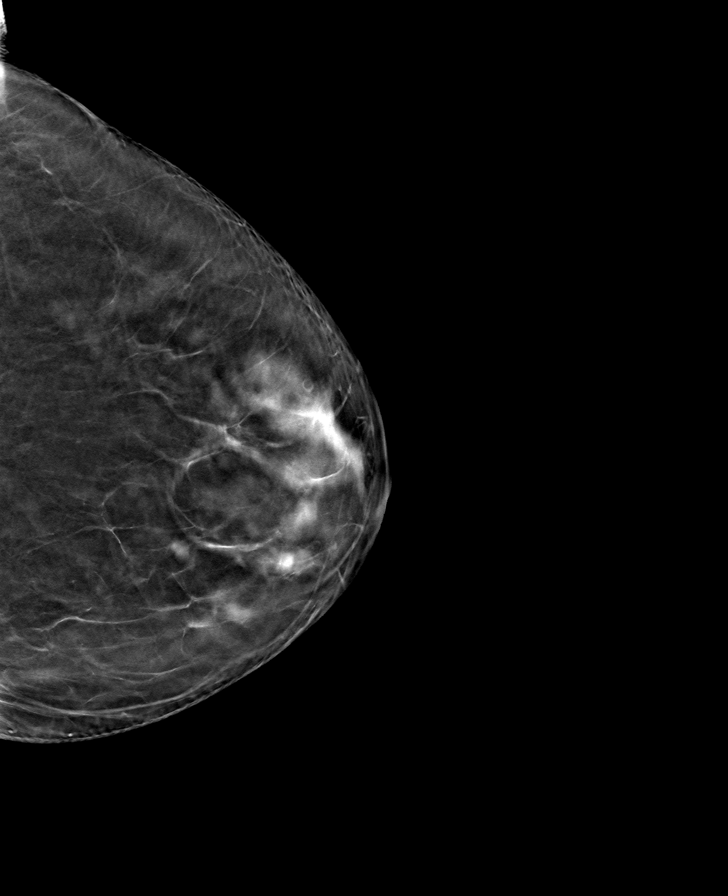

[8 of 24 positions shown; findings below may reference images not displayed]

ACR Breast Density Category b: There are scattered areas of
fibroglandular density.
FINDINGS: There are no findings suspicious for malignancy. Images were
processed with CAD.
IMPRESSION: No mammographic evidence of malignancy. A result letter of this
screening mammogram will be mailed directly to the patient.

RECOMMENDATION:
Screening mammogram in one year. (Code:[id number])

BI-RADS CATEGORY  1: Negative.

## 2020-02-29 ENCOUNTER — Other Ambulatory Visit: Payer: Self-pay | Admitting: Psychiatry

## 2020-02-29 DIAGNOSIS — F5105 Insomnia due to other mental disorder: Secondary | ICD-10-CM

## 2020-03-13 ENCOUNTER — Telehealth (INDEPENDENT_AMBULATORY_CARE_PROVIDER_SITE_OTHER): Payer: Medicare Other | Admitting: Psychiatry

## 2020-03-13 ENCOUNTER — Encounter: Payer: Self-pay | Admitting: Psychiatry

## 2020-03-13 ENCOUNTER — Other Ambulatory Visit: Payer: Self-pay

## 2020-03-13 DIAGNOSIS — Z79899 Other long term (current) drug therapy: Secondary | ICD-10-CM | POA: Diagnosis not present

## 2020-03-13 DIAGNOSIS — F3174 Bipolar disorder, in full remission, most recent episode manic: Secondary | ICD-10-CM | POA: Diagnosis not present

## 2020-03-13 NOTE — Progress Notes (Signed)
Virtual Visit via Telephone Note  I connected with Rachel Macias on 03/13/20 at 10:00 AM EST by telephone and verified that I am speaking with the correct person using two identifiers.  Location Provider Location : [facility] Patient Location : Home  Participants: Patient , Provider   I discussed the limitations, risks, security and privacy concerns of performing an evaluation and management service by telephone and the availability of in person appointments. I also discussed with the patient that there may be a patient responsible charge related to this service. The patient expressed understanding and agreed to proceed.   I discussed the assessment and treatment plan with the patient. The patient was provided an opportunity to ask questions and all were answered. The patient agreed with the plan and demonstrated an understanding of the instructions.   The patient was advised to call back or seek an in-person evaluation if the symptoms worsen or if the condition fails to improve as anticipated.   [place] MD OP Progress Note  03/13/2020 2:57 PM Rachel Macias  MRN:  [id number]  Chief Complaint:  Chief Complaint    Follow-up     HPI: Rachel Macias is a 75 year old Caucasian female, widowed, lives in [place], has a history of bipolar disorder, insomnia was evaluated by telemedicine today.  Patient today reports she is currently doing well with regards to her mood.  Denies any mood swings.  Denies any depression or anxiety.  She reports sleep as improving.  She reports she is able to sleep through the night and feels rested when she wakes up in the morning.  Patient denies any suicidality, homicidality or perceptual disturbances.  Patient reports she had her labs done.  Reviewed and discussed Depakote level, CBC with differential with patient.  She is compliant on all her medications.  Denies side effects.  She had a good holiday season and spent it with her family.  Patient denies any other concerns  today.  Visit Diagnosis:    ICD-10-CM   1. Bipolar disorder, in full remission, most recent episode manic ([place])  F31.74   2. High risk medication use  Z79.899     Past Psychiatric History: I have reviewed past psychiatric history from my progress note on 06/10/2017.  Past trials of Prozac, lithium, Zyprexa  Past Medical History:  Past Medical History:  Diagnosis Date  . Bipolar 1 disorder (HCC) 1995   Dr. Cephus Shelling ([phone])  . Ex-smoker 2001   minimal  . Osteoporosis 2015   fosamax reaction, then was on boniva IV for ~1 yr  . Postmenopausal    age 40  . Vitamin D deficiency     Past Surgical History:  Procedure Laterality Date  . CESAREAN SECTION    . DEXA  09/2013   T -2.8 femur  . TUBAL LIGATION     with reversal    Family Psychiatric History: I have reviewed family psychiatric history from my progress note on 06/10/2017  Family History:  Family History  Problem Relation Age of Onset  . Thyroid disease Other        runs in family  . Diabetes Brother   . Bipolar disorder Mother   . Depression Mother   . Anxiety disorder Mother   . Stroke Neg Hx   . CAD Neg Hx   . Cancer Neg Hx     Social History: Reviewed social history from my progress note on 06/10/2017 Social History   Socioeconomic History  . Marital status: Widowed  Spouse name: Not on file  . Number of children: 1  . Years of education: Not on file  . Highest education level: Associate degree: occupational, Scientist, product/process development, or vocational program  Occupational History    Comment: retired  Tobacco Use  . Smoking status: Former Smoker    Quit date: 03/10/1999    Years since quitting: 21.0  . Smokeless tobacco: Never Used  Vaping Use  . Vaping Use: Never used  Substance and Sexual Activity  . Alcohol use: No  . Drug use: No  . Sexual activity: Not Currently  Other Topics Concern  . Not on file  Social History Narrative   Lives alone with 2 dogs, widow; husband died from brain cancer around 07/06/2009.     Had lived in [place], [place]. Moved to [place] to be closer to family.    son died at age 54yo (hit by car).   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 Difficulty of Paying Living Expenses: Not hard at all  Food Insecurity: No Food Insecurity  . Worried About Programme researcher, broadcasting/film/video in the Last Year: Never true  . Ran Out of Food in the Last Year: Never true  Transportation Needs: No Transportation Needs  . Lack of Transportation (Medical): No  . Lack of Transportation (Non-Medical): No  Physical Activity: Not on file  Stress: No Stress Concern Present  . Feeling of Stress : Not at all  Social Connections: Not on file    Allergies:  Allergies  Allergen Reactions  . Alendronate Sodium Other (See Comments)    GI distress GI distress GI distress  . Amoxicillin Hives    Metabolic Disorder Labs: Lab Results  Component Value Date   HGBA1C 5.0 01/13/2018   Lab Results  Component Value Date   PROLACTIN 11.7 01/13/2018   PROLACTIN 10.8 04/16/2017   Lab Results  Component Value Date   CHOL 170 05/11/2019   TRIG 144 05/11/2019   HDL 51 05/11/2019   CHOLHDL 3.3 05/11/2019   VLDL 44.0 (H) 12/24/2015   LDLCALC 94 05/11/2019   LDLCALC 67 01/13/2018   Lab Results  Component Value Date   TSH 3.30 12/24/2015   TSH 2.93 10/12/2014    Therapeutic Level Labs: Lab Results  Component Value Date   LITHIUM 1.40 (H) 08/15/2016   LITHIUM 2.10 (HH) 08/15/2016   Lab Results  Component Value Date   VALPROATE 82 12/22/2019   VALPROATE 68 12/21/2018   No components found for:  CBMZ  Current Medications: Current Outpatient Medications  Medication Sig Dispense Refill  . cholecalciferol (VITAMIN D) 1000 UNITS tablet Take 2 tablets (2,000 Units  total) by mouth daily.    . divalproex (DEPAKOTE ER) 500 MG 24 hr tablet TAKE 2 TABLETS BY MOUTH AT  BEDTIME 180 tablet 3  . QUEtiapine (SEROQUEL) 200 MG tablet TAKE 1 TABLET BY MOUTH AT  BEDTIME 90 tablet 3  . simvastatin (ZOCOR) 20 MG tablet TAKE 1 TABLET BY MOUTH IN  THE EVENING 90 tablet 3   No current facility-administered medications for this visit.     Musculoskeletal: Strength & Muscle Tone: UTA Gait & Station: UTA Patient leans: N/A  Psychiatric Specialty Exam: Review of Systems  Psychiatric/Behavioral: Negative for  agitation, behavioral problems, confusion, decreased concentration, dysphoric mood, hallucinations, self-injury, sleep disturbance and suicidal ideas. The patient is not nervous/anxious and is not hyperactive.   All other systems reviewed and are negative.   There were no vitals taken for this visit.There is no height or weight on file to calculate BMI.  General Appearance: UTA  Eye Contact:  UTA  Speech:  Clear and Coherent  Volume:  Normal  Mood:  Euthymic  Affect:  UTA  Thought Process:  Goal Directed and Descriptions of Associations: Intact  Orientation:  Full (Time, Place, and Person)  Thought Content: Logical   Suicidal Thoughts:  No  Homicidal Thoughts:  No  Memory:  Immediate;   Fair Recent;   Fair Remote;   Fair  Judgement:  Fair  Insight:  Fair  Psychomotor Activity:  UTA  Concentration:  Concentration: Fair and Attention Span: Fair  Recall:  [employer] of Knowledge: Fair  Language: Fair  Akathisia:  No  Handed:  Right  AIMS (if indicated): UTA  Assets:  Communication Skills Desire for Improvement Housing Social Support  ADL's:  Intact  Cognition: WNL  Sleep:  Fair   Screenings: Optometrist Office Visit from 05/08/2019 in [facility] [place] Office Visit from 05/29/2016 in [facility] [place] Office Visit from 02/12/2016 in [facility] Visit from 09/14/2014 [facility] Visit from 08/22/2013 in [facility] Total Score 0 0 0 0 0  PHQ-9 Total Score 3 -- -- -- --       Assessment and Plan: Tymara Saur is a 75 year old Caucasian female who has a history of bipolar disorder, history of lithium toxicity was evaluated by telemedicine today.  Patient is currently stable on current medication regimen.  Plan as noted below.  Plan Bipolar disorder in remission Depakote ER 1000 mg p.o. nightly Depakote level on 12/22/2019-reviewed and discussed manage-82-therapeutic CBC with differential-dated 12/22/2019-M[facility] slightly higher at 33.6.  Otherwise within normal limits. Seroquel 200 mg p.o. nightly.  High risk medication use-Depakote level and CBC reviewed and discussed with patient as noted above Patient advised to repeat vitamin B12, folate and thyroid panel.  She will discuss with her primary care provider.  Follow-up in clinic in 5 to 6 months or sooner if needed.  I have spent atleast 20 minutes non face to face with patient today. More than 50 % of the time was spent for preparing to see the patient ( e.g., review of test, records ),  ordering medications and test ,psychoeducation and supportive psychotherapy and care coordination,as well as documenting clinical information in electronic health record,interpreting and communication of test results This note was generated in part or whole with voice recognition software. Voice recognition is usually quite accurate but there are transcription errors that can and very often do occur. I apologize for any typographical errors that were not detected and corrected.      Jomarie Longs, MD 03/13/2020, 2:57 PM

## 2020-05-08 ENCOUNTER — Other Ambulatory Visit: Payer: Self-pay

## 2020-05-08 ENCOUNTER — Encounter: Payer: Self-pay | Admitting: Family

## 2020-05-08 ENCOUNTER — Ambulatory Visit (INDEPENDENT_AMBULATORY_CARE_PROVIDER_SITE_OTHER): Payer: Medicare Other | Admitting: Family

## 2020-05-08 VITALS — BP 116/78 | HR 100 | Temp 98.3°F | Ht 64.0 in | Wt 156.8 lb

## 2020-05-08 DIAGNOSIS — F3174 Bipolar disorder, in full remission, most recent episode manic: Secondary | ICD-10-CM

## 2020-05-08 DIAGNOSIS — F319 Bipolar disorder, unspecified: Secondary | ICD-10-CM | POA: Diagnosis not present

## 2020-05-08 DIAGNOSIS — M810 Age-related osteoporosis without current pathological fracture: Secondary | ICD-10-CM | POA: Diagnosis not present

## 2020-05-08 DIAGNOSIS — E785 Hyperlipidemia, unspecified: Secondary | ICD-10-CM | POA: Diagnosis not present

## 2020-05-08 LAB — CBC WITH DIFFERENTIAL/PLATELET
Basophils Absolute: 0 K/uL (ref 0.0–0.1)
Basophils Relative: 0.4 % (ref 0.0–3.0)
Eosinophils Absolute: 0.1 K/uL (ref 0.0–0.7)
Eosinophils Relative: 1.4 % (ref 0.0–5.0)
HCT: 40.5 % (ref 36.0–46.0)
Hemoglobin: 14 g/dL (ref 12.0–15.0)
Lymphocytes Relative: 40.9 % (ref 12.0–46.0)
Lymphs Abs: 2.2 K/uL (ref 0.7–4.0)
MCHC: 34.6 g/dL (ref 30.0–36.0)
MCV: 96.6 fl (ref 78.0–100.0)
Monocytes Absolute: 0.5 K/uL (ref 0.1–1.0)
Monocytes Relative: 9.5 % (ref 3.0–12.0)
Neutro Abs: 2.5 K/uL (ref 1.4–7.7)
Neutrophils Relative %: 47.8 % (ref 43.0–77.0)
Platelets: 239 K/uL (ref 150.0–400.0)
RBC: 4.19 Mil/uL (ref 3.87–5.11)
RDW: 12.5 % (ref 11.5–15.5)
WBC: 5.3 K/uL (ref 4.0–10.5)

## 2020-05-08 LAB — COMPREHENSIVE METABOLIC PANEL WITH GFR
ALT: 9 U/L (ref 0–35)
AST: 13 U/L (ref 0–37)
Albumin: 4.3 g/dL (ref 3.5–5.2)
Alkaline Phosphatase: 61 U/L (ref 39–117)
BUN: 22 mg/dL (ref 6–23)
CO2: 30 meq/L (ref 19–32)
Calcium: 9.9 mg/dL (ref 8.4–10.5)
Chloride: 103 meq/L (ref 96–112)
Creatinine, Ser: 1.07 mg/dL (ref 0.40–1.20)
GFR: 51.1 mL/min — ABNORMAL LOW
Glucose, Bld: 99 mg/dL (ref 70–99)
Potassium: 4.9 meq/L (ref 3.5–5.1)
Sodium: 141 meq/L (ref 135–145)
Total Bilirubin: 0.4 mg/dL (ref 0.2–1.2)
Total Protein: 7.6 g/dL (ref 6.0–8.3)

## 2020-05-08 LAB — LIPID PANEL
Cholesterol: 177 mg/dL (ref 0–200)
HDL: 48.4 mg/dL (ref >39.00)
LDL Cholesterol: 97 mg/dL (ref 0–99)
NonHDL: 128.42
Total CHOL/HDL Ratio: 4
Triglycerides: 156 mg/dL — ABNORMAL HIGH (ref 0.0–149.0)
VLDL: 31.2 mg/dL (ref 0.0–40.0)

## 2020-05-08 NOTE — Patient Instructions (Addendum)
  For post menopausal women, guidelines recommend a diet with 1200 mg of Calcium per day. If you are eating calcium rich foods, you do not need a calcium supplement. The body better absorbs the calcium that you eat over supplementation. If you do supplement, I recommend not supplementing the full 1200 mg/ day as this can lead to increased risk of cardiovascular disease. I recommend Calcium Citrate over the counter, and you may take a total of 600 to 800 mg per day in divided doses with meals for best absorption.   For bone health, you need adequate vitamin D, and I recommend you supplement as it is harder to do so with diet alone. I recommend cholecalciferol 800 units daily.  Also, please ensure you are following a diet high in calcium -- research shows better outcomes with dietary sources including kale, yogurt, broccolii, cheese, okra, almonds- to name a few.     Also remember that exercise is a great medicine for maintain and preserve bone health. Advise moderate exercise for 30 minutes , 3 times per week.     Nice to see you!

## 2020-05-08 NOTE — Assessment & Plan Note (Signed)
Overdue. Discussed to optimize vitamin d and calcium. Patient declines treating with medication for osteoporosis as well as repeat DEXA. She will let me know if were to want to pursue either.

## 2020-05-08 NOTE — Progress Notes (Signed)
Subjective:    Patient ID: Rachel Macias, female    DOB: 03-04-1946, 75 y.o.   MRN: [id number]  CC: Rachel Macias is a 75 y.o. female who presents today for follow up.   HPI: Feels well today  No complaints.   Bipolar- feels well and symptoms stable on seroquel and depakote.   continues to follow with dr Elna Breslow, last visit 08/2019  HLD- compliant with zocor 20mg   Known osteoporosis- Due dexa.  Compliant with vitamin d 2000 units/day ; she does miss doses. She is doing light weights and elliptical 30 minutes per day. She doesn't take supplemental calcium   HISTORY:  Past Medical History:  Diagnosis Date  . Bipolar 1 disorder (HCC) 1995   Dr. [phone])  . Ex-smoker 2001   minimal  . Osteoporosis 2015   fosamax reaction, then was on boniva IV for ~1 yr  . Postmenopausal    age 14  . Vitamin D deficiency    Past Surgical History:  Procedure Laterality Date  . CESAREAN SECTION    . DEXA  09/2013   T -2.8 femur  . TUBAL LIGATION     with reversal   Family History  Problem Relation Age of Onset  . Thyroid disease Other        runs in family  . Diabetes Brother   . Bipolar disorder Mother   . Depression Mother   . Anxiety disorder Mother   . Stroke Neg Hx   . CAD Neg Hx   . Cancer Neg Hx     Allergies: Alendronate sodium and Amoxicillin Current Outpatient Medications on File Prior to Visit  Medication Sig Dispense Refill  . cholecalciferol (VITAMIN D) 1000 UNITS tablet Take 2 tablets (2,000 Units total) by mouth daily.    . divalproex (DEPAKOTE ER) 500 MG 24 hr tablet TAKE 2 TABLETS BY MOUTH AT  BEDTIME 180 tablet 3  . QUEtiapine (SEROQUEL) 200 MG tablet TAKE 1 TABLET BY MOUTH AT  BEDTIME 90 tablet 3  . simvastatin (ZOCOR) 20 MG tablet TAKE 1 TABLET BY MOUTH IN  THE EVENING 90 tablet 3   No current facility-administered medications on file prior to visit.    Social History   Tobacco Use  . Smoking status: Former Smoker    Quit date: 03/10/1999     Years since quitting: 21.1  . Smokeless tobacco: Never Used  Vaping Use  . Vaping Use: Never used  Substance Use Topics  . Alcohol use: No  . Drug use: No    Review of Systems  Constitutional: Negative for chills and fever.  Respiratory: Negative for cough.   Cardiovascular: Negative for chest pain and palpitations.  Gastrointestinal: Negative for nausea and vomiting.      Objective:    BP 116/78   Pulse 100   Temp 98.3 F (36.8 C)   Ht 5\' 4"  (1.626 m)   Wt 156 lb 12.8 oz (71.1 kg)   SpO2 97%   BMI 26.91 kg/m  BP Readings from Last 3 Encounters:  05/08/20 116/78  05/08/19 100/62  01/12/18 122/84   Wt Readings from Last 3 Encounters:  05/08/20 156 lb 12.8 oz (71.1 kg)  01/19/20 157 lb (71.2 kg)  11/08/19 157 lb (71.2 kg)    Physical Exam Vitals reviewed.  Constitutional:      Appearance: She is well-developed and well-nourished.  Eyes:     Conjunctiva/sclera: Conjunctivae normal.  Cardiovascular:     Rate and Rhythm: Normal rate and  regular rhythm.     Pulses: Normal pulses.     Heart sounds: Normal heart sounds.  Pulmonary:     Effort: Pulmonary effort is normal.     Breath sounds: Normal breath sounds. No wheezing, rhonchi or rales.  Skin:    General: Skin is warm and dry.  Neurological:     Mental Status: She is alert.  Psychiatric:        Mood and Affect: Mood and affect normal.        Speech: Speech normal.        Behavior: Behavior normal.        Thought Content: Thought content normal.        Assessment & Plan:   Problem List Items Addressed This Visit      Musculoskeletal and Integument   Osteoporosis    Overdue. Discussed to optimize vitamin d and calcium. Patient declines treating with medication for osteoporosis as well as repeat DEXA. She will let me know if were to want to pursue either.       Relevant Orders   VITAMIN D 25 Hydroxy (Vit-D Deficiency, Fractures)     Other   Bipolar 1 disorder (HCC)    Symptomatically stable.  Pending baseline labs due to seroquel, depakote. Will follow       Relevant Orders   CBC with Differential/Platelet   Comprehensive metabolic panel   TSH   VITAMIN D 25 Hydroxy (Vit-D Deficiency, Fractures)   Bipolar disorder, in full remission, most recent episode manic (HCC)   Dyslipidemia - Primary   Relevant Orders   CBC with Differential/Platelet   Comprehensive metabolic panel   Lipid panel   TSH   VITAMIN D 25 Hydroxy (Vit-D Deficiency, Fractures)     Declines repeating DEXA Scan.   I am having Rachel Macias maintain her cholecalciferol, QUEtiapine, simvastatin, and divalproex.   No orders of the defined types were placed in this encounter.   Return precautions given.   Risks, benefits, and alternatives of the medications and treatment plan prescribed today were discussed, and patient expressed understanding.   Education regarding symptom management and diagnosis given to patient on AVS.  Continue to follow with Rachel Grana, FNP for routine health maintenance.   Rachel Macias and I agreed with plan.   Rachel Plowman, FNP

## 2020-05-08 NOTE — Assessment & Plan Note (Signed)
Symptomatically stable. Pending baseline labs due to seroquel, depakote. Will follow

## 2020-05-09 LAB — TSH: TSH: 1.5 u[IU]/mL (ref 0.35–4.50)

## 2020-05-09 LAB — VITAMIN D 25 HYDROXY (VIT D DEFICIENCY, FRACTURES): VITD: 34.58 ng/mL (ref 30.00–100.00)

## 2020-07-29 ENCOUNTER — Other Ambulatory Visit: Payer: Self-pay | Admitting: Psychiatry

## 2020-07-29 DIAGNOSIS — F3110 Bipolar disorder, current episode manic without psychotic features, unspecified: Secondary | ICD-10-CM

## 2020-09-05 ENCOUNTER — Telehealth: Payer: Medicare Other | Admitting: Psychiatry

## 2020-09-16 ENCOUNTER — Telehealth (INDEPENDENT_AMBULATORY_CARE_PROVIDER_SITE_OTHER): Payer: Medicare Other | Admitting: Psychiatry

## 2020-09-16 ENCOUNTER — Encounter: Payer: Self-pay | Admitting: Psychiatry

## 2020-09-16 ENCOUNTER — Other Ambulatory Visit: Payer: Self-pay

## 2020-09-16 DIAGNOSIS — F3174 Bipolar disorder, in full remission, most recent episode manic: Secondary | ICD-10-CM

## 2020-09-16 NOTE — Progress Notes (Signed)
Virtual Visit via Telephone Note  I connected with Rachel Macias on 09/16/20 at 10:00 AM EDT by telephone and verified that I am speaking with the correct person using two identifiers.  Location Provider Location : [facility] Patient Location : Home  Participants: Patient , Provider   I discussed the limitations, risks, security and privacy concerns of performing an evaluation and management service by telephone and the availability of in person appointments. I also discussed with the patient that there may be a patient responsible charge related to this service. The patient expressed understanding and agreed to proceed.   I discussed the assessment and treatment plan with the patient. The patient was provided an opportunity to ask questions and all were answered. The patient agreed with the plan and demonstrated an understanding of the instructions.   The patient was advised to call back or seek an in-person evaluation if the symptoms worsen or if the condition fails to improve as anticipated.   BH MD OP Progress Note  09/16/2020 3:54 PM Rachel Macias  MRN:  [id number]  Chief Complaint:  Chief Complaint   Follow-up    HPI: Rachel Macias is a 74 year old Caucasian female, widowed, lives in [place], has a history of bipolar disorder, insomnia was evaluated by telemedicine today.  Patient today reports she is currently doing well with regards to her mood.  Denies any significant depression, manic or hypomanic symptoms.  She reports she is compliant on the Depakote and her Seroquel.  She denies side effects.  Patient reports sleep as good.  She reports appetite is fair.  Patient reports she has good social support system from her sister and they recently celebrated their birthdays together.  Patient reports she continues to follow-up with her primary care provider and was told that she is currently doing well.  She had slight variation of her renal function however she currently denies any  symptoms and reports she was told it was likely due to dehydration.  Patient denies any other concerns today.  Visit Diagnosis:    ICD-10-CM   1. Bipolar disorder, in full remission, most recent episode manic (HCC)  F31.74       Past Psychiatric History: Reviewed past psychiatric history from progress note on 06/10/2017.  Past trials of Prozac, lithium, Zyprexa  Past Medical History:  Past Medical History:  Diagnosis Date   Bipolar 1 disorder (HCC) 1995   Dr. Caryn Section [phone])   Ex-smoker 2001   minimal   Osteoporosis 2015   fosamax reaction, then was on boniva IV for ~1 yr   Postmenopausal    age 38   Vitamin D deficiency     Past Surgical History:  Procedure Laterality Date   CESAREAN SECTION     DEXA  09/2013   T -2.8 femur   TUBAL LIGATION     with reversal    Family Psychiatric History: Reviewed family psychiatric history from progress note on 06/10/2017.  Family History:  Family History  Problem Relation Age of Onset   Thyroid disease Other        runs in family   Diabetes Brother    Bipolar disorder Mother    Depression Mother    Anxiety disorder Mother    Stroke Neg Hx    CAD Neg Hx    Cancer Neg Hx     Social History: Reviewed social history from progress note on 06/10/2017. Social History   Socioeconomic History   Marital status: Widowed    Spouse name: Not  on file   Number of children: 1   Years of education: Not on file   Highest education level: Associate degree: occupational, Scientist, product/process development, or vocational program  Occupational History    Comment: retired  Tobacco Use   Smoking status: Former    Pack years: 0.00    Types: Cigarettes    Quit date: 03/10/1999    Years since quitting: 21.5   Smokeless tobacco: Never  Vaping Use   Vaping Use: Never used  Substance and Sexual Activity   Alcohol use: No   Drug use: No   Sexual activity: Not Currently  Other Topics Concern   Not on file  Social History Narrative   Lives alone with 2 dogs,  widow; husband died from brain cancer around 05-Jul-2009.    Had lived in [place], [place]. Moved to [place] to be closer to family.    son died at age 85yo (hit by car).   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)   Edu: 13 yrs   Activity: no regular exercise   Diet: good water, fruits/vegetables some   Social Determinants of Corporate investment banker Strain: Low Risk    Difficulty of Paying Living Expenses: Not hard at all  Food Insecurity: No Food Insecurity   Worried About Programme researcher, broadcasting/film/video in the Last Year: Never true   Barista in the Last Year: Never true  Transportation Needs: No Transportation Needs   Lack of Transportation (Medical): No   Lack of Transportation (Non-Medical): No  Physical Activity: Not on file  Stress: No Stress Concern Present   Feeling of Stress : Not at all  Social Connections: Not on file    Allergies:  Allergies  Allergen Reactions   Alendronate Sodium Other (See Comments)    GI distress GI distress GI distress   Amoxicillin Hives    Metabolic Disorder Labs: Lab Results  Component Value Date   HGBA1C 5.0 01/13/2018   Lab Results  Component Value Date   PROLACTIN 11.7 01/13/2018   PROLACTIN 10.8 04/16/2017   Lab Results  Component Value Date   CHOL 177 05/08/2020   TRIG 156.0 (H) 05/08/2020   HDL 48.40 05/08/2020   CHOLHDL 4 05/08/2020   VLDL 31.2 05/08/2020   LDLCALC 97 05/08/2020   LDLCALC 94 05/11/2019   Lab Results  Component Value Date   TSH 1.50 05/08/2020   TSH 3.30 12/24/2015    Therapeutic Level Labs: Lab Results  Component Value Date   LITHIUM 1.40 (H) 08/15/2016   LITHIUM 2.10 (HH) 08/15/2016   Lab Results  Component Value Date   VALPROATE 82 12/22/2019   VALPROATE 68 12/21/2018   No components found for:  CBMZ  Current Medications: Current Outpatient Medications  Medication Sig Dispense Refill   cholecalciferol (VITAMIN D)  1000 UNITS tablet Take 2 tablets (2,000 Units total) by mouth daily.     divalproex (DEPAKOTE ER) 500 MG 24 hr tablet TAKE 2 TABLETS BY MOUTH AT  BEDTIME 180 tablet 3   QUEtiapine (SEROQUEL) 200 MG tablet TAKE 1 TABLET BY MOUTH AT  BEDTIME 90 tablet 3   simvastatin (ZOCOR) 20 MG tablet TAKE 1 TABLET BY MOUTH IN  THE EVENING 90 tablet 3   No current facility-administered medications for this visit.     Musculoskeletal: Strength & Muscle Tone:  UTA Gait & Station:  UTA Patient leans: N/A  Psychiatric Specialty  Exam: Review of Systems  Psychiatric/Behavioral:  Negative for agitation, behavioral problems, confusion, decreased concentration, dysphoric mood, self-injury, sleep disturbance and suicidal ideas. The patient is not nervous/anxious and is not hyperactive.   All other systems reviewed and are negative.  There were no vitals taken for this visit.There is no height or weight on file to calculate BMI.  General Appearance:  UTA  Eye Contact:   UTA  Speech:  Clear and Coherent  Volume:  Normal  Mood:  Euthymic  Affect:   UTA  Thought Process:  Goal Directed and Descriptions of Associations: Intact  Orientation:  Full (Time, Place, and Person)  Thought Content: Logical   Suicidal Thoughts:  No  Homicidal Thoughts:  No  Memory:  Immediate;   Fair Recent;   Fair Remote;   Fair  Judgement:  Fair  Insight:  Fair  Psychomotor Activity:   UTA  Concentration:  Concentration: Fair and Attention Span: Fair  Recall:  [employer] of Knowledge: Fair  Language: Fair  Akathisia:  No  Handed:  Right  AIMS (if indicated): not done  Assets:  Communication Skills Desire for Improvement Housing Social Support  ADL's:  Intact  Cognition: WNL  Sleep:  Fair   Screenings: PHQ2-9    Flowsheet Row Video Visit from 09/16/2020 in [facility] Office Visit from 05/08/2019 in [facility] [place] Office Visit from 05/29/2016 in [facility] [place]  Office Visit from 02/12/2016 in [facility] Office Visit from 09/14/2014 in [facility]  PHQ-2 Total Score 0 0 0 0 0  PHQ-9 Total Score 0 3 -- -- --      Flowsheet Row Video Visit from 09/16/2020 in [facility]  C-SSRS RISK CATEGORY No Risk        Assessment and Plan: Sanyah Molnar is a 75 year old Caucasian female who has a history of bipolar disorder, history of lithium toxicity was evaluated by telemedicine today.  Patient is currently stable.  Plan as noted below.  Plan Bipolar disorder in remission Depakote ER 1000 mg p.o. nightly Seroquel 200 mg p.o. nightly Depakote level on 12/22/2019-82-therapeutic.   Patient advised to come into the office for in office visit in 3 months from now.  We will consider repeating her Depakote level and other labs as needed at that visit.  Follow-up in clinic in 3 months in office or sooner if needed.   I have spent at least 15 minutes non face to face with patient today.   This note was generated in part or whole with voice recognition software. Voice recognition is usually quite accurate but there are transcription errors that can and very often do occur. I apologize for any typographical errors that were not detected and corrected.     Jomarie Longs, MD 09/16/2020, 3:54 PM

## 2020-10-10 ENCOUNTER — Other Ambulatory Visit: Payer: Self-pay | Admitting: Family

## 2020-10-10 DIAGNOSIS — E785 Hyperlipidemia, unspecified: Secondary | ICD-10-CM

## 2020-12-15 ENCOUNTER — Other Ambulatory Visit: Payer: Self-pay | Admitting: Psychiatry

## 2020-12-15 DIAGNOSIS — F5105 Insomnia due to other mental disorder: Secondary | ICD-10-CM

## 2020-12-17 ENCOUNTER — Other Ambulatory Visit: Payer: Self-pay | Admitting: Psychiatry

## 2020-12-17 ENCOUNTER — Other Ambulatory Visit: Payer: Self-pay

## 2020-12-17 ENCOUNTER — Encounter: Payer: Self-pay | Admitting: Psychiatry

## 2020-12-17 ENCOUNTER — Ambulatory Visit (INDEPENDENT_AMBULATORY_CARE_PROVIDER_SITE_OTHER): Payer: Medicare Other | Admitting: Psychiatry

## 2020-12-17 VITALS — BP 143/88 | HR 87 | Temp 97.2°F | Wt 152.2 lb

## 2020-12-17 DIAGNOSIS — R030 Elevated blood-pressure reading, without diagnosis of hypertension: Secondary | ICD-10-CM | POA: Diagnosis not present

## 2020-12-17 DIAGNOSIS — Z79899 Other long term (current) drug therapy: Secondary | ICD-10-CM | POA: Diagnosis not present

## 2020-12-17 DIAGNOSIS — F3174 Bipolar disorder, in full remission, most recent episode manic: Secondary | ICD-10-CM

## 2020-12-17 NOTE — Progress Notes (Signed)
BH MD OP Progress Note  12/17/2020 10:27 AM Mardelle Pandolfi  MRN:  [id number]  Chief Complaint:  Chief Complaint   Follow-up; Medication Refill; Depression    HPI: Rachel Macias is a 75 year old Caucasian female, widowed, lives in [place], has a history of bipolar disorder, insomnia was evaluated in office today.  Patient today reports she is currently doing well with regards to her mood.  Denies any mood swings.  Denies any sadness, racing thoughts.  Patient reports sleep is overall okay.  She reports appetite is fair.  Patient is compliant on medications.  Denies side effects.  She spends her time reading books, watching TV and also spending time with family.  Patient with elevated blood pressure reading in session today, reports her blood pressure is usually low, not currently on blood pressure medication.  Patient denies any other concerns today.  Visit Diagnosis:    ICD-10-CM   1. Bipolar disorder, in full remission, most recent episode manic (HCC)  F31.74 Valproic acid level    Hepatic function panel    Platelet count    Sodium    Prolactin    2. High risk medication use  Z79.899 Valproic acid level    Hepatic function panel    Platelet count    Sodium    Prolactin    3. Elevated blood pressure reading  R03.0       Past Psychiatric History: Reviewed past psychiatric history from progress note on 06/10/2017.  Past trials of Prozac, lithium, Zyprexa  Past Medical History:  Past Medical History:  Diagnosis Date   Bipolar 1 disorder (HCC) 1995   Dr. Caryn Section [phone])   Ex-smoker 07-21-1999   minimal   Osteoporosis 2015   fosamax reaction, then was on boniva IV for ~1 yr   Postmenopausal    age 3   Vitamin D deficiency     Past Surgical History:  Procedure Laterality Date   CESAREAN SECTION     DEXA  09/2013   T -2.8 femur   TUBAL LIGATION     with reversal    Family Psychiatric History: Reviewed family psychiatric history from progress note on  06/10/2017  Family History:  Family History  Problem Relation Age of Onset   Thyroid disease Other        runs in family   Diabetes Brother    Bipolar disorder Mother    Depression Mother    Anxiety disorder Mother    Stroke Neg Hx    CAD Neg Hx    Cancer Neg Hx     Social History: Reviewed social history from progress note on 06/10/2017 Social History   Socioeconomic History   Marital status: Widowed    Spouse name: Not on file   Number of children: 1   Years of education: Not on file   Highest education level: Associate degree: occupational, Scientist, product/process development, or vocational program  Occupational History    Comment: retired  Tobacco Use   Smoking status: Former    Types: Cigarettes    Quit date: 03/10/1999    Years since quitting: 21.7   Smokeless tobacco: Never  Vaping Use   Vaping Use: Never used  Substance and Sexual Activity   Alcohol use: No   Drug use: No   Sexual activity: Not Currently  Other Topics Concern   Not on file  Social History Narrative   Lives alone with 2 dogs, widow; husband died from brain cancer around 20-Jul-2009.    Had lived in [place],  [place]. Moved to [place] to be closer to family.    son died at age 72yo (hit by car).   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)   Edu: 13 yrs   Activity: no regular exercise   Diet: good water, fruits/vegetables some   Social Determinants of Corporate investment banker Strain: Low Risk    Difficulty of Paying Living Expenses: Not hard at all  Food Insecurity: No Food Insecurity   Worried About Programme researcher, broadcasting/film/video in the Last Year: Never true   Barista in the Last Year: Never true  Transportation Needs: No Transportation Needs   Lack of Transportation (Medical): No   Lack of Transportation (Non-Medical): No  Physical Activity: Not on file  Stress: No Stress Concern Present   Feeling of Stress : Not at all  Social Connections: Not on  file    Allergies:  Allergies  Allergen Reactions   Alendronate Sodium Other (See Comments)    GI distress GI distress GI distress   Amoxicillin Hives    Metabolic Disorder Labs: Lab Results  Component Value Date   HGBA1C 5.0 01/13/2018   Lab Results  Component Value Date   PROLACTIN 11.7 01/13/2018   PROLACTIN 10.8 04/16/2017   Lab Results  Component Value Date   CHOL 177 05/08/2020   TRIG 156.0 (H) 05/08/2020   HDL 48.40 05/08/2020   CHOLHDL 4 05/08/2020   VLDL 31.2 05/08/2020   LDLCALC 97 05/08/2020   LDLCALC 94 05/11/2019   Lab Results  Component Value Date   TSH 1.50 05/08/2020   TSH 3.30 12/24/2015    Therapeutic Level Labs: Lab Results  Component Value Date   LITHIUM 1.40 (H) 08/15/2016   LITHIUM 2.10 (HH) 08/15/2016   Lab Results  Component Value Date   VALPROATE 82 12/22/2019   VALPROATE 68 12/21/2018   No components found for:  CBMZ  Current Medications: Current Outpatient Medications  Medication Sig Dispense Refill   cholecalciferol (VITAMIN D) 1000 UNITS tablet Take 2 tablets (2,000 Units total) by mouth daily.     divalproex (DEPAKOTE ER) 500 MG 24 hr tablet TAKE 2 TABLETS BY MOUTH AT  BEDTIME 180 tablet 3   QUEtiapine (SEROQUEL) 200 MG tablet TAKE 1 TABLET BY MOUTH AT  BEDTIME 90 tablet 3   simvastatin (ZOCOR) 20 MG tablet TAKE 1 TABLET BY MOUTH IN  THE EVENING 90 tablet 3   No current facility-administered medications for this visit.     Musculoskeletal: Strength & Muscle Tone: within normal limits Gait & Station: normal Patient leans: N/A  Psychiatric Specialty Exam: Review of Systems  Psychiatric/Behavioral:  Negative for agitation, behavioral problems, confusion, decreased concentration, dysphoric mood, hallucinations, self-injury, sleep disturbance and suicidal ideas. The patient is not nervous/anxious and is not hyperactive.   All other systems reviewed and are negative.  Blood pressure (!) 158/88, pulse 94, temperature (!)  97.2 F (36.2 C), temperature source Temporal, weight 152 lb 3.2 oz (69 kg).Body mass index is 26.13 kg/m.  General Appearance: Casual  Eye Contact:  Good  Speech:  Clear and Coherent  Volume:  Normal  Mood:  Euthymic  Affect:  Congruent  Thought Process:  Goal Directed and Descriptions of Associations: Intact  Orientation:  Full (Time, Place, and Person)  Thought Content: Logical   Suicidal Thoughts:  No  Homicidal Thoughts:  No  Memory:  Immediate;   Fair  Recent;   Fair Remote;   Fair  Judgement:  Fair  Insight:  Fair  Psychomotor Activity:  Normal  Concentration:  Concentration: Fair and Attention Span: Fair  Recall:  [employer] of Knowledge: Fair  Language: Fair  Akathisia:  No  Handed:  Right  AIMS (if indicated): done  Assets:  Communication Skills Desire for Improvement Housing Social Support Talents/Skills Transportation  ADL's:  Intact  Cognition: WNL  Sleep:  Fair   Screenings: AIMS    Flowsheet Row Office Visit from 12/17/2020 in [facility]  AIMS Total Score 0      PHQ2-9    Flowsheet Row Office Visit from 12/17/2020 in [facility] Video Visit from 09/16/2020 in [facility] Office Visit from 05/08/2019 in [facility] Office Visit from 05/29/2016 in [facility] [place] Office Visit from 02/12/2016 in [facility] [place]  PHQ-2 Total Score 0 0 0 0 0  PHQ-9 Total Score -- 0 3 -- --      Flowsheet Row Video Visit from 09/16/2020 in [facility]  C-SSRS RISK CATEGORY No Risk        Assessment and Plan: Karalina Tift is a 75 year old Caucasian female, has a history of bipolar disorder, history of lithium toxicity was evaluated in office today.  Patient is currently stable with regards to her mood however does have elevated blood pressure reading today.  She will benefit from the following  plan.  Plan Bipolar disorder in remission Depakote ER 1000 mg p.o. nightly Seroquel 200 mg p.o. nightly Depakote level on 12/22/2019-82-therapeutic AIMS - 0  High risk medication use-will order Depakote level, prolactin, hepatic function panel, sodium, platelet count.  Provided lab slip.  Elevated blood pressure reading in session today-provided education.  Patient to follow up with primary care provider.  Follow-up in clinic in 3 months in person.  This note was generated in part or whole with voice recognition software. Voice recognition is usually quite accurate but there are transcription errors that can and very often do occur. I apologize for any typographical errors that were not detected and corrected.      Jomarie Longs, MD 12/17/2020, 10:27 AM

## 2020-12-18 DIAGNOSIS — Z23 Encounter for immunization: Secondary | ICD-10-CM | POA: Diagnosis not present

## 2020-12-18 LAB — SODIUM: Sodium: 141 mmol/L (ref 134–144)

## 2020-12-18 LAB — HEPATIC FUNCTION PANEL
ALT: 12 IU/L (ref 0–32)
AST: 20 IU/L (ref 0–40)
Albumin: 4.4 g/dL (ref 3.7–4.7)
Alkaline Phosphatase: 66 IU/L (ref 44–121)
Bilirubin Total: 0.3 mg/dL (ref 0.0–1.2)
Bilirubin, Direct: <0.1 mg/dL (ref 0.00–0.40)
Total Protein: 7.2 g/dL (ref 6.0–8.5)

## 2020-12-18 LAB — PROLACTIN: Prolactin: 7.7 ng/mL (ref 4.8–23.3)

## 2020-12-18 LAB — VALPROIC ACID LEVEL: Valproic Acid Lvl: 63 ug/mL (ref 50–100)

## 2020-12-18 LAB — PLATELET COUNT: Platelets: 211 10*3/uL (ref 150–450)

## 2021-01-09 ENCOUNTER — Other Ambulatory Visit: Payer: Self-pay | Admitting: Family

## 2021-01-09 DIAGNOSIS — Z1231 Encounter for screening mammogram for malignant neoplasm of breast: Secondary | ICD-10-CM

## 2021-01-21 ENCOUNTER — Ambulatory Visit (INDEPENDENT_AMBULATORY_CARE_PROVIDER_SITE_OTHER): Payer: Medicare Other

## 2021-01-21 VITALS — Ht 64.0 in | Wt 152.0 lb

## 2021-01-21 DIAGNOSIS — Z0000 Encounter for general adult medical examination without abnormal findings: Secondary | ICD-10-CM | POA: Diagnosis not present

## 2021-01-21 NOTE — Progress Notes (Signed)
Subjective:   Rachel Macias is a 75 y.o. female who presents for Medicare Annual (Subsequent) preventive examination.  Review of Systems    No ROS.  Medicare Wellness Virtual Visit.  Visual/audio telehealth visit, UTA vital signs.   See social history for additional risk factors.   Cardiac Risk Factors include: advanced age (>59men, >34 women)     Objective:    Today's Vitals   01/21/21 1032  Weight: 152 lb (68.9 kg)  Height: 5\' 4"  (1.626 m)   Body mass index is 26.09 kg/m.  Advanced Directives 01/21/2021 01/19/2020 01/18/2019 01/12/2018 01/04/2017  Does Patient Have a Medical Advance Directive? Yes Yes Yes Yes Yes  Type of 01/06/2017 of [place];Living will Healthcare Power of [place];Living will Healthcare Power of [place];Living will Healthcare Power of [place];Living will Healthcare Power of Attorney  Does patient want to make changes to medical advance directive? No - Patient declined No - Patient declined No - Patient declined No - Patient declined No - Patient declined  Copy of Healthcare Power of Attorney in Chart? Yes - validated most recent copy scanned in chart (See row information) Yes - validated most recent copy scanned in chart (See row information) Yes - validated most recent copy scanned in chart (See row information) Yes - validated most recent copy scanned in chart (See row information) No - copy requested  Some encounter information is confidential and restricted. Go to Review Flowsheets activity to see all data.    Current Medications (verified) Outpatient Encounter Medications as of 01/21/2021  Medication Sig   cholecalciferol (VITAMIN D) 1000 UNITS tablet Take 2 tablets (2,000 Units total) by mouth daily.   divalproex (DEPAKOTE ER) 500 MG 24 hr tablet TAKE 2 TABLETS BY MOUTH AT  BEDTIME   QUEtiapine (SEROQUEL) 200 MG tablet TAKE 1 TABLET BY MOUTH AT  BEDTIME   simvastatin (ZOCOR) 20 MG tablet TAKE 1 TABLET BY MOUTH IN  THE EVENING    No facility-administered encounter medications on file as of 01/21/2021.    Allergies (verified) Alendronate sodium and Amoxicillin   History: Past Medical History:  Diagnosis Date   Bipolar 1 disorder (HCC) 1995   Dr. 01/23/2021 [id number])   Ex-smoker 2001   minimal   Osteoporosis 2015   fosamax reaction, then was on boniva IV for ~1 yr   Postmenopausal    age 22   Vitamin D deficiency    Past Surgical History:  Procedure Laterality Date   CESAREAN SECTION     DEXA  09/2013   T -2.8 femur   TUBAL LIGATION     with reversal   Family History  Problem Relation Age of Onset   Thyroid disease Other        runs in family   Diabetes Brother    Bipolar disorder Mother    Depression Mother    Anxiety disorder Mother    Stroke Neg Hx    CAD Neg Hx    Cancer Neg Hx    Social History   Socioeconomic History   Marital status: Widowed    Spouse name: Not on file   Number of children: 1   Years of education: Not on file   Highest education level: Associate degree: occupational, 10/2013, or vocational program  Occupational History    Comment: retired  Tobacco Use   Smoking status: Former    Types: Cigarettes    Quit date: 03/10/1999    Years since quitting: 21.8   Smokeless tobacco: Never  Vaping Use   Vaping Use: Never used  Substance and Sexual Activity   Alcohol use: No   Drug use: No   Sexual activity: Not Currently  Other Topics Concern   Not on file  Social History Narrative   Lives alone with 2 dogs, widow; husband died from brain cancer around 2009-07-02.    Had lived in [place], [place]. Moved to [place] to be closer to family.    son died at age 68yo (hit by car).   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)   Edu: 13 yrs   Activity: no regular exercise   Diet: good water, fruits/vegetables some   Social Determinants of Corporate investment banker Strain: Low Risk     Difficulty of Paying Living Expenses: Not hard at all  Food Insecurity: No Food Insecurity   Worried About Programme researcher, broadcasting/film/video in the Last Year: Never true   Barista in the Last Year: Never true  Transportation Needs: No Transportation Needs   Lack of Transportation (Medical): No   Lack of Transportation (Non-Medical): No  Physical Activity: Sufficiently Active   Days of Exercise per Week: 5 days   Minutes of Exercise per Session: 30 min  Stress: No Stress Concern Present   Feeling of Stress : Not at all  Social Connections: Moderately Isolated   Frequency of Communication with Friends and Family: Three times a week   Frequency of Social Gatherings with Friends and Family: Once a week   Attends Religious Services: More than 4 times per year   Active Member of [employer] or Organizations: No   Attends Banker Meetings: Never   Marital Status: Widowed    Tobacco Counseling Counseling given: Not Answered   Clinical Intake:  Pre-visit preparation completed: Yes        Diabetes: No  How often do you need to have someone help you when you read instructions, pamphlets, or other written materials from your doctor or pharmacy?: 1 - Never   Interpreter Needed?: No      Activities of Daily Living In your present state of health, do you have any difficulty performing the following activities: 01/21/2021  Hearing? N  Vision? N  Difficulty concentrating or making decisions? N  Walking or climbing stairs? N  Dressing or bathing? N  Doing errands, shopping? N  Preparing Food and eating ? N  Using the Toilet? N  In the past six months, have you accidently leaked urine? N  Do you have problems with loss of bowel control? N  Managing your Medications? N  Managing your Finances? N  Housekeeping or managing your Housekeeping? N  Some recent data might be hidden   Patient Care Team: Allegra Grana, FNP as PCP - General (Family Medicine)  Indicate any  recent Medical Services you may have received from other than [facility] providers in the past year (date may be approximate).     Assessment:   This is a routine wellness examination for Rachel Macias.  Virtual Visit via Telephone Note  I connected with  Rachel Macias on 01/21/21 at 10:30 AM EST by telephone and verified that I am speaking with the correct person using two identifiers.  Location: Patient: home  Provider: office Persons participating in the virtual visit: patient/Nurse Health Advisor   I discussed the limitations, risks, security and privacy concerns of performing an evaluation and management service  by telephone and the availability of in person appointments. The patient expressed understanding and agreed to proceed.  Interactive audio and video telecommunications were attempted between this nurse and patient, however failed, due to patient having technical difficulties OR patient did not have access to video capability.  We continued and completed visit with audio only.  Some vital signs may be absent or patient reported.   Hearing/Vision screen Hearing Screening - Comments:: Patient is able to hear conversational tones without difficulty. No issues reported. Vision Screening - Comments:: Wears corrective lenses when reading  They have regular follow up with the ophthalmologist  Dietary issues and exercise activities discussed: Current Exercise Habits: Home exercise routine, Type of exercise: walking, Intensity: Mild Regular diet Good water intake   Goals Addressed             This Visit's Progress    Healthy Lifestyle       I plan to get a fitbit to assist with BP readings and falls.        Depression Screen PHQ 2/9 Scores 01/21/2021 01/19/2020 05/08/2019 01/18/2019 01/12/2018 01/04/2017 05/29/2016  PHQ - 2 Score 0 - 0 - - - 0  PHQ- 9 Score - - 3 - - - -  Exception Documentation - Other- indicate reason in comment box - Other- indicate reason in comment box Other-  indicate reason in comment box Other- indicate reason in comment box -  Not completed - - - - Psychiatrist visits every 3 months Currently in treatment with Dr.Braden ([facility]) -  Some encounter information is confidential and restricted. Go to Review Flowsheets activity to see all data.    Fall Risk Fall Risk  01/21/2021 01/19/2020 05/08/2019 02/01/2019 01/18/2019  Falls in the past year? 0 0 0 0 0  Comment - - - Emmi Telephone Survey: data to providers prior to load -  Number falls in past yr: - 0 - - -  Injury with Fall? - 0 - - -  Follow up Falls evaluation completed Falls evaluation completed Falls evaluation completed - -   FALL RISK PREVENTION PERTAINING TO THE HOME: Home free of loose throw rugs in walkways, pet beds, electrical cords, etc? Yes  Adequate lighting in your home to reduce risk of falls? Yes   ASSISTIVE DEVICES UTILIZED TO PREVENT FALLS: Life alert? No  Use of a cane, walker or w/c? No   TIMED UP AND GO: Was the test performed? No .   Cognitive Function: Patient is alert and oriented x3.  MMSE - Mini Mental State Exam 01/04/2017  Not completed: Refused     6CIT Screen 01/21/2021 01/18/2019 01/12/2018  What Year? 0 points 0 points 0 points  What month? 0 points 0 points 0 points  What time? 0 points 0 points 0 points  Count back from 20 0 points 0 points 0 points  Months in reverse 0 points 0 points 0 points  Repeat phrase 2 points 0 points 0 points  Total Score 2 0 0    Immunizations Immunization History  Administered Date(s) Administered   Influenza, High Dose Seasonal PF 12/12/2015, 01/04/2017, 01/12/2018, 12/19/2018, 12/18/2020   Influenza-Unspecified 01/07/2013, 12/08/2019   PFIZER(Purple Top)SARS-COV-2 Vaccination 05/10/2019, 05/31/2019, 12/08/2019, 12/18/2020   Pneumococcal Conjugate-13 08/22/2013   Pneumococcal Polysaccharide-23 09/14/2014   TDAP status: Due, Education has been provided regarding the importance of this vaccine. Advised may receive  this vaccine at local pharmacy or Health Dept. Aware to provide a copy of the vaccination record if obtained from local  pharmacy or Health Dept. Verbalized acceptance and understanding. Deferred.   Qualifies for Shingles Vaccine? Yes   Zostavax completed No   Shingrix Completed?: No.    Education has been provided regarding the importance of this vaccine. Patient has been advised to call insurance company to determine out of pocket expense if they have not yet received this vaccine. Advised may also receive vaccine at local pharmacy or Health Dept. Verbalized acceptance and understanding.  Screening Tests Health Maintenance  Topic Date Due   Zoster Vaccines- Shingrix (1 of 2) 04/23/2021 (Originally 08/28/1964)   TETANUS/TDAP  01/21/2022 (Originally 08/28/1964)   COVID-19 Vaccine (5 - Booster for Pfizer series) 02/12/2021   Fecal DNA (Cologuard)  01/30/2022   Pneumonia Vaccine 54+ Years old  Completed   INFLUENZA VACCINE  Completed   DEXA SCAN  Completed   Hepatitis C Screening  Completed   HPV VACCINES  Aged Out    Health Maintenance There are no preventive care reminders to display for this patient.   Colorectal cancer screening: Type of screening: Cologuard. Completed 01/2019. Repeat every 3 years  Mammogram- scheduled 02/24/21.   Lung Cancer Screening: (Low Dose CT Chest recommended if Age 8-80 years, 30 pack-year currently smoking OR have quit w/in 15years.) does not qualify.   Vision Screening: Recommended annual ophthalmology exams for early detection of glaucoma and other disorders of the eye.  Dental Screening: Recommended annual dental exams for proper oral hygiene  Community Resource Referral / Chronic Care Management: CRR required this visit?  No   CCM required this visit?  No      Plan:   Keep all routine maintenance appointments.   I have personally reviewed and noted the following in the patient's chart:   Medical and social history Use of alcohol, tobacco  or illicit drugs  Current medications and supplements including opioid prescriptions. Not taking opioid.  Functional ability and status Nutritional status Physical activity Advanced directives List of other physicians Hospitalizations, surgeries, and ER visits in previous 12 months Vitals Screenings to include cognitive, depression, and falls Referrals and appointments  In addition, I have reviewed and discussed with patient certain preventive protocols, quality metrics, and best practice recommendations. A written personalized care plan for preventive services as well as general preventive health recommendations were provided to patient.     Ashok Pall, LPN   [id number]

## 2021-01-21 NOTE — Patient Instructions (Addendum)
Rachel Macias , Thank you for taking time to come for your Medicare Wellness Visit. I appreciate your ongoing commitment to your health goals. Please review the following plan we discussed and let me know if I can assist you in the future.   These are the goals we discussed:  Goals       Patient Stated     Increase physical activity (pt-stated)      I will walk more for exercise      Other     Healthy Lifestyle      I plan to get a fitbit to assist with BP readings and falls.         This is a list of the screening recommended for you and due dates:  Health Maintenance  Topic Date Due   Zoster (Shingles) Vaccine (1 of 2) 04/23/2021*   Tetanus Vaccine  01/21/2022*   COVID-19 Vaccine (5 - Booster for Pfizer series) 02/12/2021   Cologuard (Stool DNA test)  01/30/2022   Pneumonia Vaccine  Completed   Flu Shot  Completed   DEXA scan (bone density measurement)  Completed   Hepatitis C Screening: USPSTF Recommendation to screen - Ages 49-79 yo.  Completed   HPV Vaccine  Aged Out  *Topic was postponed. The date shown is not the original due date.    Advanced directives: on file  Conditions/risks identified: none new  Follow up in one year for your annual wellness visit    Preventive Care 65 Years and Older, Female Preventive care refers to lifestyle choices and visits with your health care provider that can promote health and wellness. What does preventive care include? A yearly physical exam. This is also called an annual well check. Dental exams once or twice a year. Routine eye exams. Ask your health care provider how often you should have your eyes checked. Personal lifestyle choices, including: Daily care of your teeth and gums. Regular physical activity. Eating a healthy diet. Avoiding tobacco and drug use. Limiting alcohol use. Practicing safe sex. Taking low-dose aspirin every day. Taking vitamin and mineral supplements as recommended by your health care  provider. What happens during an annual well check? The services and screenings done by your health care provider during your annual well check will depend on your age, overall health, lifestyle risk factors, and family history of disease. Counseling  Your health care provider may ask you questions about your: Alcohol use. Tobacco use. Drug use. Emotional well-being. Home and relationship well-being. Sexual activity. Eating habits. History of falls. Memory and ability to understand (cognition). Work and work Astronomer. Reproductive health. Screening  You may have the following tests or measurements: Height, weight, and BMI. Blood pressure. Lipid and cholesterol levels. These may be checked every 5 years, or more frequently if you are over 70 years old. Skin check. Lung cancer screening. You may have this screening every year starting at age 56 if you have a 30-pack-year history of smoking and currently smoke or have quit within the past 15 years. Fecal occult blood test (FOBT) of the stool. You may have this test every year starting at age 39. Flexible sigmoidoscopy or colonoscopy. You may have a sigmoidoscopy every 5 years or a colonoscopy every 10 years starting at age 71. Hepatitis C blood test. Hepatitis B blood test. Sexually transmitted disease (STD) testing. Diabetes screening. This is done by checking your blood sugar (glucose) after you have not eaten for a while (fasting). You may have this done every 1-3  years. Bone density scan. This is done to screen for osteoporosis. You may have this done starting at age 80. Mammogram. This may be done every 1-2 years. Talk to your health care provider about how often you should have regular mammograms. Talk with your health care provider about your test results, treatment options, and if necessary, the need for more tests. Vaccines  Your health care provider may recommend certain vaccines, such as: Influenza vaccine. This is  recommended every year. Tetanus, diphtheria, and acellular pertussis (Tdap, Td) vaccine. You may need a Td booster every 10 years. Zoster vaccine. You may need this after age 76. Pneumococcal 13-valent conjugate (PCV13) vaccine. One dose is recommended after age 66. Pneumococcal polysaccharide (PPSV23) vaccine. One dose is recommended after age 81. Talk to your health care provider about which screenings and vaccines you need and how often you need them. This information is not intended to replace advice given to you by your health care provider. Make sure you discuss any questions you have with your health care provider. Document Released: 03/22/2015 Document Revised: 11/13/2015 Document Reviewed: 12/25/2014 Elsevier Interactive Patient Education  2017 [place] Prevention in the Home Falls can cause injuries. They can happen to people of all ages. There are many things you can do to make your home safe and to help prevent falls. What can I do on the outside of my home? Regularly fix the edges of walkways and driveways and fix any cracks. Remove anything that might make you trip as you walk through a door, such as a raised step or threshold. Trim any bushes or trees on the path to your home. Use bright outdoor lighting. Clear any walking paths of anything that might make someone trip, such as rocks or tools. Regularly check to see if handrails are loose or broken. Make sure that both sides of any steps have handrails. Any raised decks and porches should have guardrails on the edges. Have any leaves, snow, or ice cleared regularly. Use sand or salt on walking paths during winter. Clean up any spills in your garage right away. This includes oil or grease spills. What can I do in the bathroom? Use night lights. Install grab bars by the toilet and in the tub and shower. Do not use towel bars as grab bars. Use non-skid mats or decals in the tub or shower. If you need to sit down in  the shower, use a plastic, non-slip stool. Keep the floor dry. Clean up any water that spills on the floor as soon as it happens. Remove soap buildup in the tub or shower regularly. Attach bath mats securely with double-sided non-slip rug tape. Do not have throw rugs and other things on the floor that can make you trip. What can I do in the bedroom? Use night lights. Make sure that you have a light by your bed that is easy to reach. Do not use any sheets or blankets that are too big for your bed. They should not hang down onto the floor. Have a firm chair that has side arms. You can use this for support while you get dressed. Do not have throw rugs and other things on the floor that can make you trip. What can I do in the kitchen? Clean up any spills right away. Avoid walking on wet floors. Keep items that you use a lot in easy-to-reach places. If you need to reach something above you, use a strong step stool that has a grab  bar. Keep electrical cords out of the way. Do not use floor polish or wax that makes floors slippery. If you must use wax, use non-skid floor wax. Do not have throw rugs and other things on the floor that can make you trip. What can I do with my stairs? Do not leave any items on the stairs. Make sure that there are handrails on both sides of the stairs and use them. Fix handrails that are broken or loose. Make sure that handrails are as long as the stairways. Check any carpeting to make sure that it is firmly attached to the stairs. Fix any carpet that is loose or worn. Avoid having throw rugs at the top or bottom of the stairs. If you do have throw rugs, attach them to the floor with carpet tape. Make sure that you have a light switch at the top of the stairs and the bottom of the stairs. If you do not have them, ask someone to add them for you. What else can I do to help prevent falls? Wear shoes that: Do not have high heels. Have rubber bottoms. Are comfortable  and fit you well. Are closed at the toe. Do not wear sandals. If you use a stepladder: Make sure that it is fully opened. Do not climb a closed stepladder. Make sure that both sides of the stepladder are locked into place. Ask someone to hold it for you, if possible. Clearly mark and make sure that you can see: Any grab bars or handrails. First and last steps. Where the edge of each step is. Use tools that help you move around (mobility aids) if they are needed. These include: Canes. Walkers. Scooters. Crutches. Turn on the lights when you go into a dark area. Replace any light bulbs as soon as they burn out. Set up your furniture so you have a clear path. Avoid moving your furniture around. If any of your floors are uneven, fix them. If there are any pets around you, be aware of where they are. Review your medicines with your doctor. Some medicines can make you feel dizzy. This can increase your chance of falling. Ask your doctor what other things that you can do to help prevent falls. This information is not intended to replace advice given to you by your health care provider. Make sure you discuss any questions you have with your health care provider. Document Released: 12/20/2008 Document Revised: 08/01/2015 Document Reviewed: 03/30/2014 Elsevier Interactive Patient Education  2017 [employer].

## 2021-02-24 ENCOUNTER — Other Ambulatory Visit: Payer: Self-pay

## 2021-02-24 ENCOUNTER — Ambulatory Visit
Admission: RE | Admit: 2021-02-24 | Discharge: 2021-02-24 | Disposition: A | Discharge status: Home / Self Care | Payer: Medicare Other | Source: Ambulatory Visit | Attending: Family | Admitting: Family

## 2021-02-24 DIAGNOSIS — Z1231 Encounter for screening mammogram for malignant neoplasm of breast: Secondary | ICD-10-CM | POA: Insufficient documentation

## 2021-02-24 IMAGING — MG MM DIGITAL SCREENING BILAT W/ TOMO AND CAD
8 series · 8 of 24 positions shown · non-contrast
Comparison: Previous exam(s).

CLINICAL DATA: Screening.

EXAM:
DIGITAL SCREENING BILATERAL MAMMOGRAM WITH TOMOSYNTHESIS AND CAD
TECHNIQUE: Bilateral screening digital craniocaudal and mediolateral oblique
mammograms were obtained. Bilateral screening digital breast
tomosynthesis was performed. The images were evaluated with
computer-aided detection.

[L MLO synth-2D]
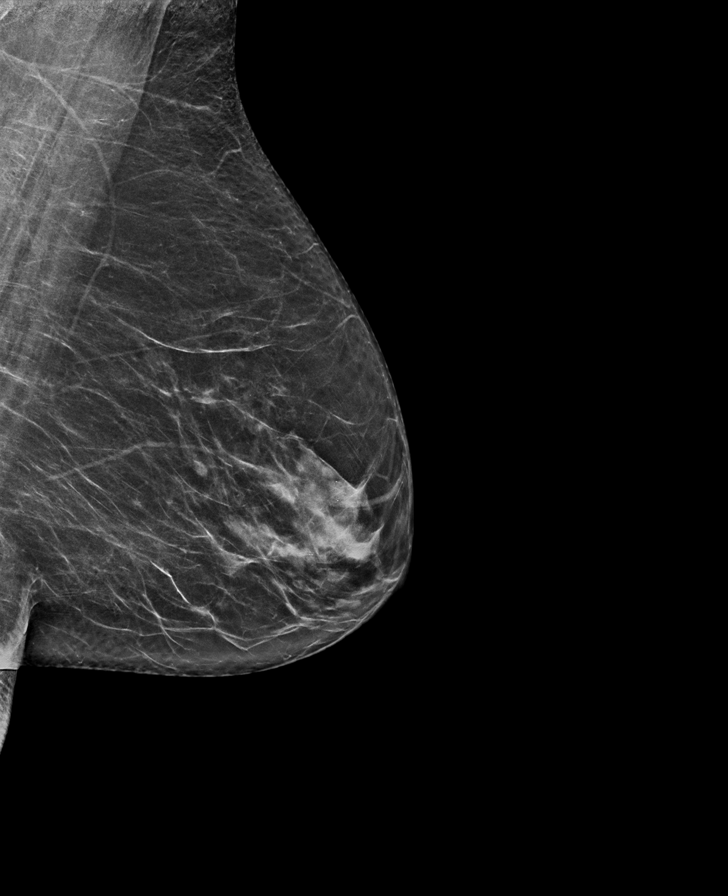

[R CC synth-2D]
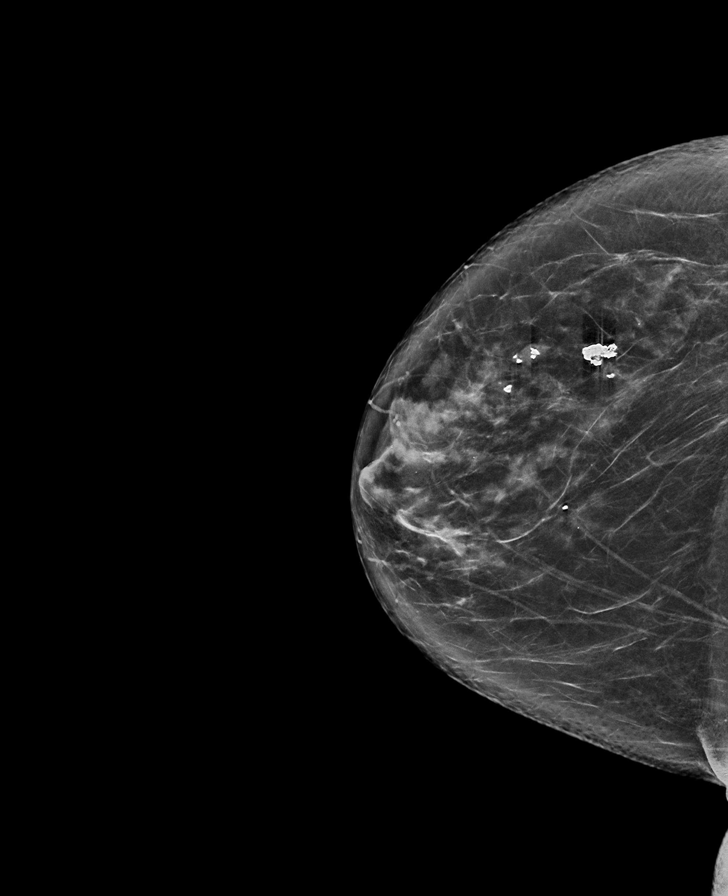

[L CC synth-2D]
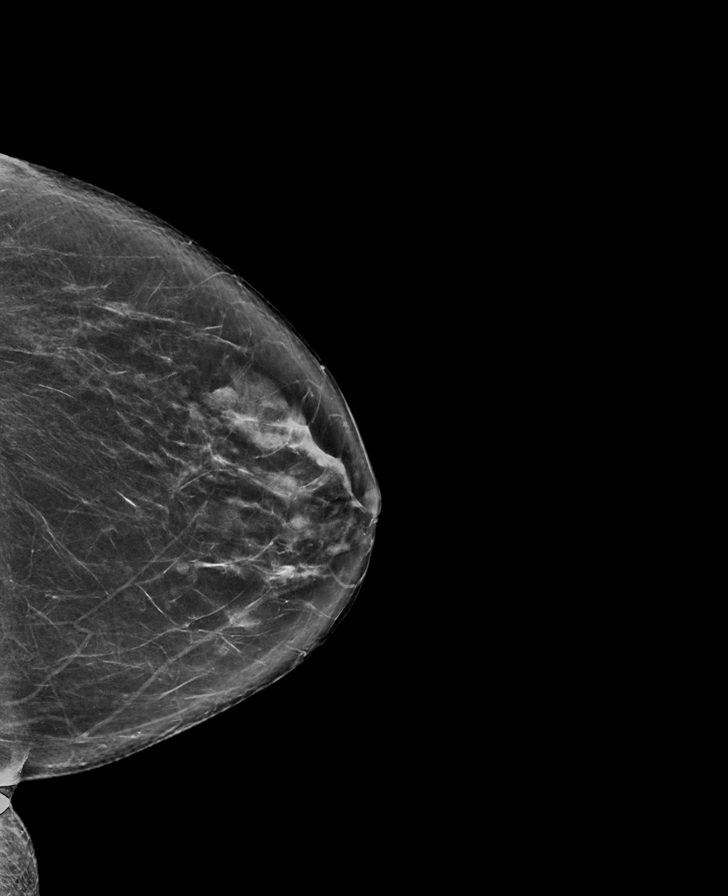

[R MLO synth-2D]
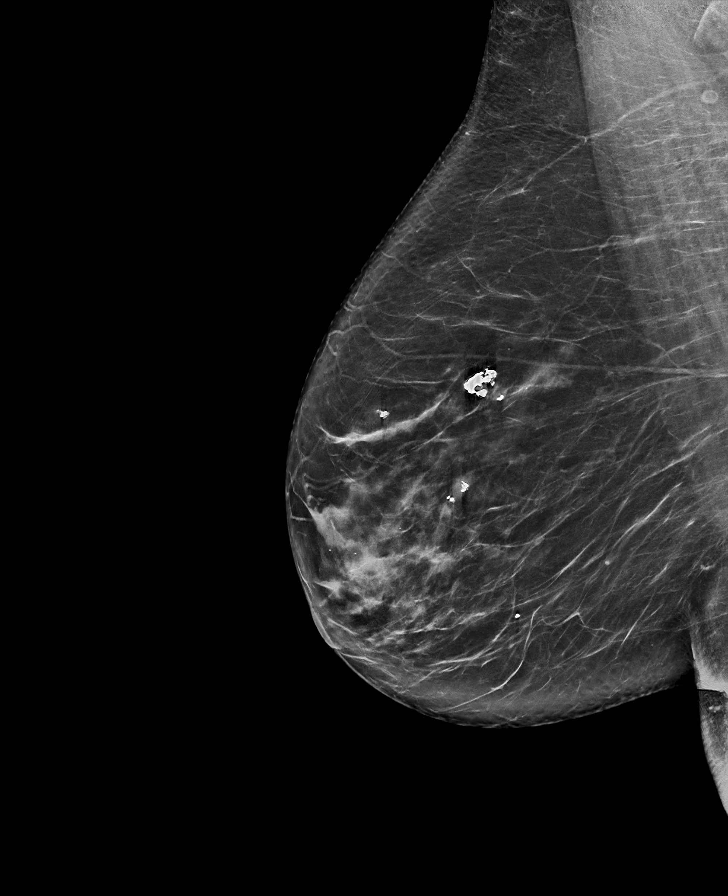

[L CC tomo · tomo slice 39/77.0]
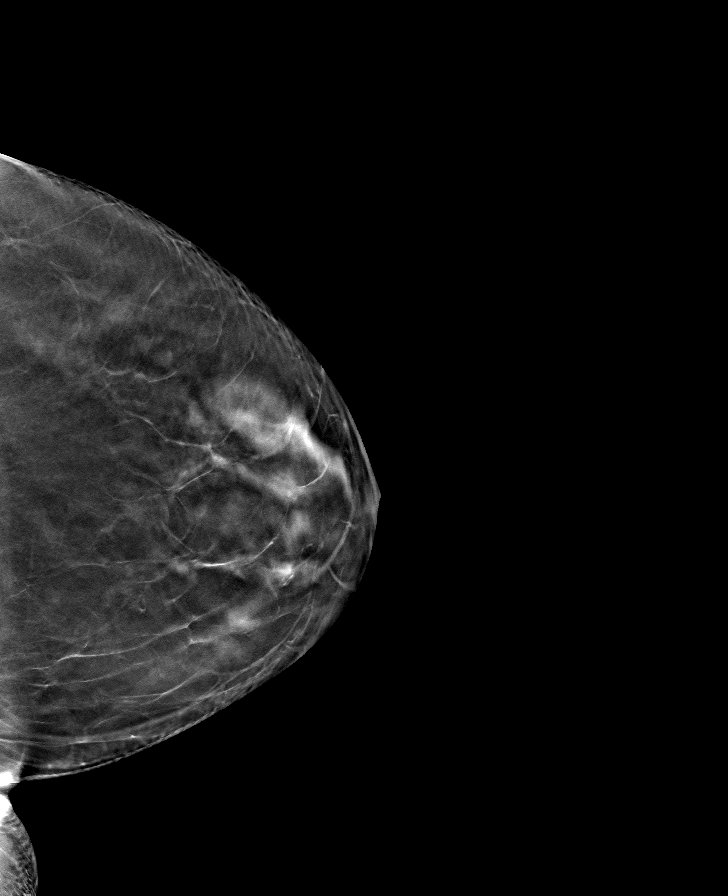

[L MLO tomo · tomo slice 33/64.0]
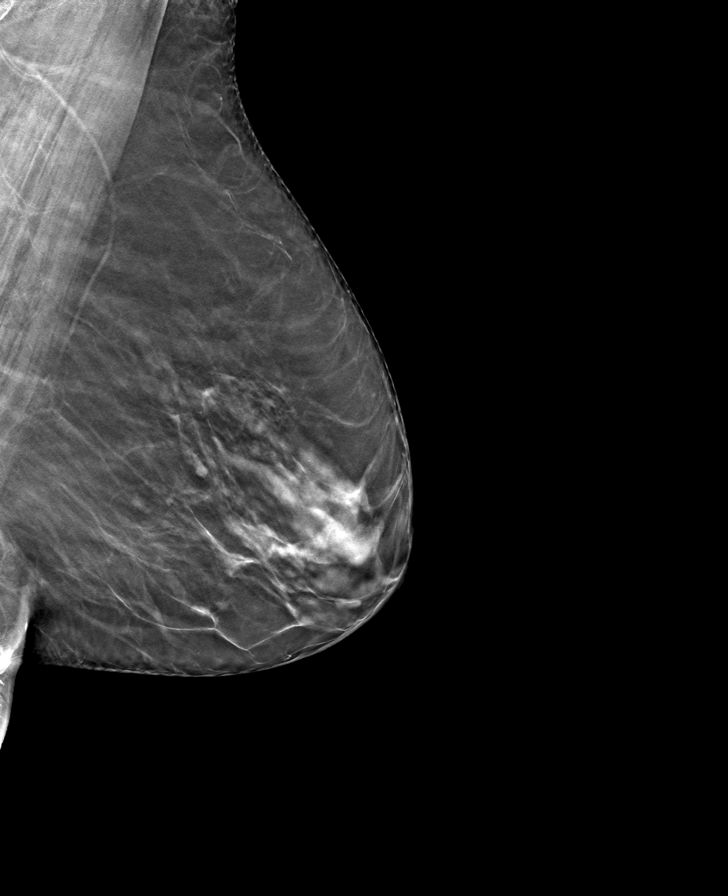

[R CC tomo · tomo slice 41/82.0]
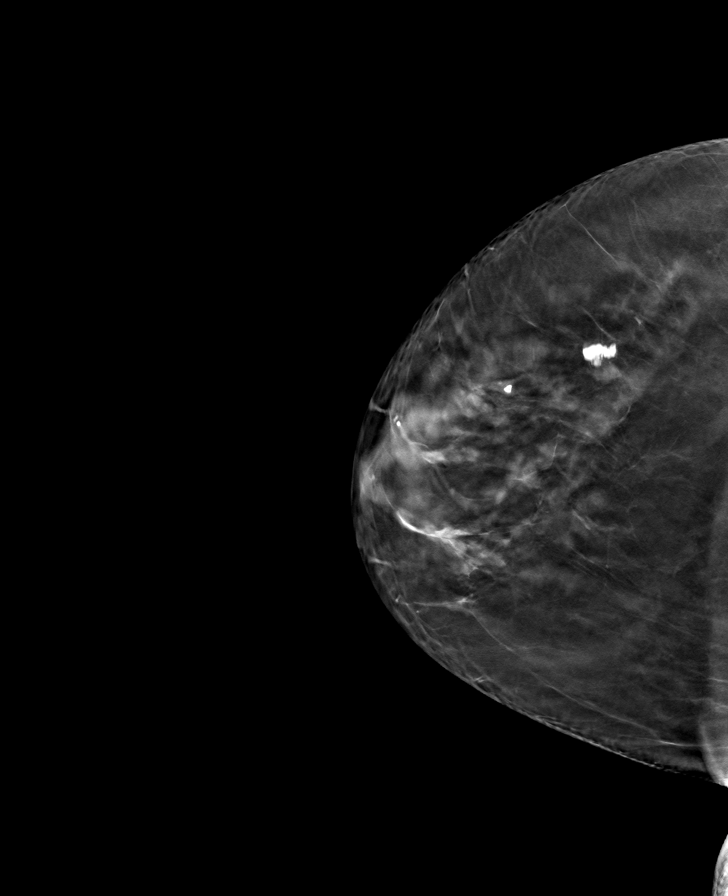

[R MLO tomo · tomo slice 42/83.0]
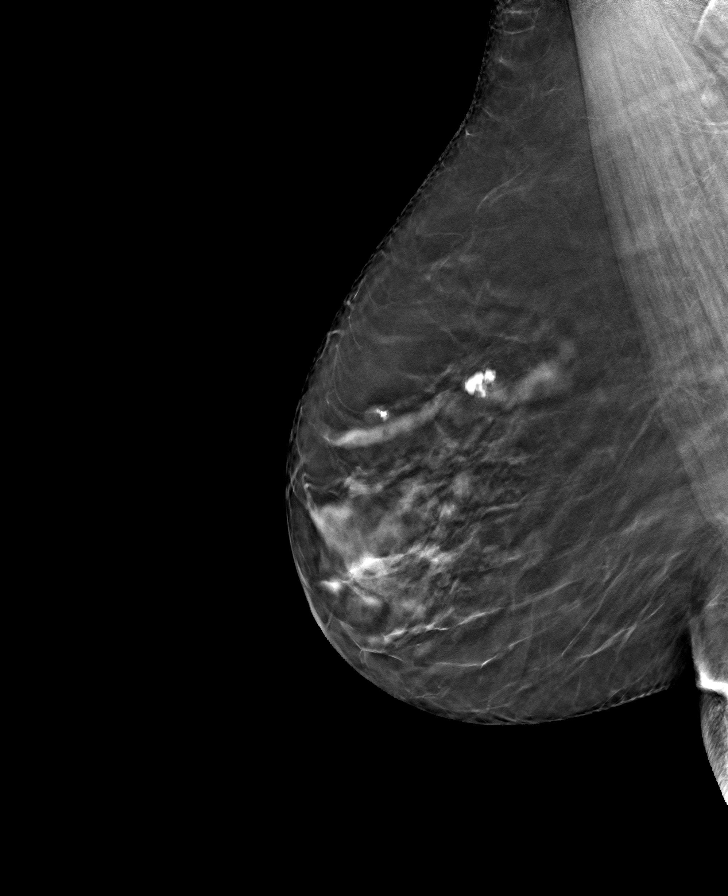

[8 of 24 positions shown; findings below may reference images not displayed]

ACR Breast Density Category b: There are scattered areas of
fibroglandular density.
FINDINGS: There are no findings suspicious for malignancy.
IMPRESSION: No mammographic evidence of malignancy. A result letter of this
screening mammogram will be mailed directly to the patient.

RECOMMENDATION:
Screening mammogram in one year. (Code:51-O-LD2)

BI-RADS CATEGORY  1: Negative.

## 2021-03-16 ENCOUNTER — Encounter: Payer: Self-pay | Admitting: Family

## 2021-03-26 ENCOUNTER — Encounter: Payer: Self-pay | Admitting: Psychiatry

## 2021-03-26 ENCOUNTER — Other Ambulatory Visit: Payer: Self-pay

## 2021-03-26 ENCOUNTER — Ambulatory Visit (INDEPENDENT_AMBULATORY_CARE_PROVIDER_SITE_OTHER): Payer: Medicare Other | Admitting: Psychiatry

## 2021-03-26 VITALS — BP 135/85 | HR 90 | Ht 64.0 in | Wt 154.0 lb

## 2021-03-26 DIAGNOSIS — F3174 Bipolar disorder, in full remission, most recent episode manic: Secondary | ICD-10-CM | POA: Diagnosis not present

## 2021-03-26 DIAGNOSIS — Z79899 Other long term (current) drug therapy: Secondary | ICD-10-CM | POA: Diagnosis not present

## 2021-03-26 NOTE — Progress Notes (Signed)
BH MD OP Progress Note  03/26/2021 5:51 PM Stephannie PetersJane Wigington  MRN:  [id number]  Chief Complaint:  Chief Complaint   Medication Management, 76 year old Caucasian female who has a history of bipolar disorder presents for a follow-up appointment.    HPI: Stephannie PetersJane Morey is a 76 year old Caucasian female, widowed, lives in [place], has a history of bipolar disorder, insomnia was evaluated in office today.  Patient today reports she is currently doing well with regards to her mood.  Denies any mood swings.  Denies any significant anxiety symptoms.  Denies any sadness or racing thoughts.  Reports she is compliant on her medications like Depakote.  Denies side effects.  Reports she continues to have a good sleep hygiene and sleep is overall okay.  Reports appetite is fair.  She reports she spends her time walking her dog as well as reading books.  She was also able to spend some time at her class reunion.  Patient denies any suicidality, homicidality or perceptual disturbances.  Reviewed and discussed most recent labs including Depakote level which came back therapeutic.  Denies any other concerns today.  Visit Diagnosis:    ICD-10-CM   1. Bipolar disorder, in full remission, most recent episode manic (HCC)  F31.74     2. High risk medication use  Z79.899       Past Psychiatric History: Reviewed past psychiatric history from progress note on 06/10/2017.  Past trials of Prozac, lithium, Zyprexa.  Past Medical History:  Past Medical History:  Diagnosis Date   Bipolar 1 disorder (HCC) 1995   Dr. Caryn SectionAarti Kapur [phone])   Ex-smoker 2001   minimal   Osteoporosis 2015   fosamax reaction, then was on boniva IV for ~1 yr   Postmenopausal    age 76   Vitamin D deficiency     Past Surgical History:  Procedure Laterality Date   CESAREAN SECTION     DEXA  09/2013   T -2.8 femur   TUBAL LIGATION     with reversal    Family Psychiatric History: Reviewed family psychiatric history from  progress note on 06/10/2017.  Family History:  Family History  Problem Relation Age of Onset   Thyroid disease Other        runs in family   Diabetes Brother    Bipolar disorder Mother    Depression Mother    Anxiety disorder Mother    Stroke Neg Hx    CAD Neg Hx    Cancer Neg Hx     Social History: Reviewed social history from progress note on 06/10/2017. Social History   Socioeconomic History   Marital status: Widowed    Spouse name: Not on file   Number of children: 1   Years of education: Not on file   Highest education level: Associate degree: occupational, Scientist, product/process developmenttechnical, or vocational program  Occupational History    Comment: retired  Tobacco Use   Smoking status: Former    Types: Cigarettes    Quit date: 03/10/1999    Years since quitting: 22.0   Smokeless tobacco: Never  Vaping Use   Vaping Use: Never used  Substance and Sexual Activity   Alcohol use: No   Drug use: No   Sexual activity: Not Currently  Other Topics Concern   Not on file  Social History Narrative   Lives alone with her dog (reported Vanessa Kickjan 18.2023), widow; husband died from brain cancer around 2011.    Had lived in [place], [place]. Moved to [place] to be closer  to family.    son died at age 91yo (hit by car).   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)   Edu: 13 yrs   Activity: no regular exercise   Diet: good water, fruits/vegetables some   Social Determinants of Corporate investment banker Strain: Low Risk    Difficulty of Paying Living Expenses: Not hard at all  Food Insecurity: No Food Insecurity   Worried About Programme researcher, broadcasting/film/video in the Last Year: Never true   Barista in the Last Year: Never true  Transportation Needs: No Transportation Needs   Lack of Transportation (Medical): No   Lack of Transportation (Non-Medical): No  Physical Activity: Sufficiently Active   Days of Exercise per Week: 5 days   Minutes of  Exercise per Session: 30 min  Stress: No Stress Concern Present   Feeling of Stress : Not at all  Social Connections: Moderately Isolated   Frequency of Communication with Friends and Family: Three times a week   Frequency of Social Gatherings with Friends and Family: Once a week   Attends Religious Services: More than 4 times per year   Active Member of [employer] or Organizations: No   Attends Banker Meetings: Never   Marital Status: Widowed    Allergies:  Allergies  Allergen Reactions   Alendronate Sodium Other (See Comments)    GI distress GI distress GI distress   Amoxicillin Hives    Metabolic Disorder Labs: Lab Results  Component Value Date   HGBA1C 5.0 01/13/2018   Lab Results  Component Value Date   PROLACTIN 7.7 12/17/2020   PROLACTIN 11.7 01/13/2018   Lab Results  Component Value Date   CHOL 177 05/08/2020   TRIG 156.0 (H) 05/08/2020   HDL 48.40 05/08/2020   CHOLHDL 4 05/08/2020   VLDL 31.2 05/08/2020   LDLCALC 97 05/08/2020   LDLCALC 94 05/11/2019   Lab Results  Component Value Date   TSH 1.50 05/08/2020   TSH 3.30 12/24/2015    Therapeutic Level Labs: Lab Results  Component Value Date   LITHIUM 1.40 (H) 08/15/2016   LITHIUM 2.10 (HH) 08/15/2016   Lab Results  Component Value Date   VALPROATE 63 12/17/2020   VALPROATE 82 12/22/2019   No components found for:  CBMZ  Current Medications: Current Outpatient Medications  Medication Sig Dispense Refill   cholecalciferol (VITAMIN D) 1000 UNITS tablet Take 2 tablets (2,000 Units total) by mouth daily.     divalproex (DEPAKOTE ER) 500 MG 24 hr tablet TAKE 2 TABLETS BY MOUTH AT  BEDTIME 180 tablet 3   QUEtiapine (SEROQUEL) 200 MG tablet TAKE 1 TABLET BY MOUTH AT  BEDTIME 90 tablet 3   simvastatin (ZOCOR) 20 MG tablet TAKE 1 TABLET BY MOUTH IN  THE EVENING 90 tablet 3   No current facility-administered medications for this visit.     Musculoskeletal: Strength & Muscle Tone: within  normal limits Gait & Station: normal Patient leans: N/A  Psychiatric Specialty Exam: Review of Systems  Psychiatric/Behavioral:  Negative for agitation, behavioral problems, confusion, decreased concentration, dysphoric mood, hallucinations, self-injury, sleep disturbance and suicidal ideas. The patient is not nervous/anxious and is not hyperactive.   All other systems reviewed and are negative.  Blood pressure 135/85, pulse 90, height 5\' 4"  (1.626 m), weight 154 lb (69.9 kg).Body mass index is 26.43 kg/m.  General Appearance: Casual  Eye Contact:  Fair  Speech:  Clear and Coherent  Volume:  Normal  Mood:  Euthymic  Affect:  Congruent  Thought Process:  Goal Directed and Descriptions of Associations: Intact  Orientation:  Full (Time, Place, and Person)  Thought Content: Logical   Suicidal Thoughts:  No  Homicidal Thoughts:  No  Memory:  Immediate;   Fair Recent;   Fair Remote;   Fair  Judgement:  Fair  Insight:  Fair  Psychomotor Activity:  Normal  Concentration:  Concentration: Fair and Attention Span: Fair  Recall:  [employer] of Knowledge: Fair  Language: Fair  Akathisia:  No  Handed:  Right  AIMS (if indicated): done, 0  Assets:  Communication Skills Desire for Improvement Housing Social Support  ADL's:  Intact  Cognition: WNL  Sleep:  Fair   Screenings: AIMS    Flowsheet Row Office Visit from 12/17/2020 in [facility]  AIMS Total Score 0      PHQ2-9    Flowsheet Row Office Visit from 03/26/2021 in [facility] Clinical Support from 01/21/2021 in [facility] [place] Office Visit from 12/17/2020 in [facility] Video Visit from 09/16/2020 in [facility] Office Visit from 05/08/2019 in [facility] [place]  PHQ-2 Total Score 0 0 0 0 0  PHQ-9 Total Score -- -- -- 0 3      Flowsheet Row Video Visit from 09/16/2020 in [facility]  C-SSRS RISK CATEGORY No Risk        Assessment and Plan: Raeanne Deschler is a 76 year old Caucasian female, has a history of bipolar disorder, history of lithium toxicity was evaluated in office today.  Patient is currently stable.  Plan as noted below.  Plan Bipolar disorder in remission Depakote ER 1000 mg p.o. nightly Seroquel 200 mg p.o. nightly Depakote level-reviewed and discussed dated 12/17/2020-63-therapeutic  High risk medication use-reviewed the following labs-Depakote level dated 12/17/2020-therapeutic at 40, platelet count-within normal limits, sodium-within normal limits, prolactin-within normal limits, hepatic function-within normal limits.  Discussed with patient.  Patient advised to keep a log of her blood pressure and follow up with primary care provider as needed.  This note was generated in part or whole with voice recognition software. Voice recognition is usually quite accurate but there are transcription errors that can and very often do occur. I apologize for any typographical errors that were not detected and corrected.         Jomarie Longs, MD 03/26/2021, 5:51 PM

## 2021-05-09 ENCOUNTER — Encounter: Payer: Medicare Other | Admitting: Family

## 2021-05-20 ENCOUNTER — Other Ambulatory Visit: Payer: Self-pay

## 2021-05-20 DIAGNOSIS — Z0000 Encounter for general adult medical examination without abnormal findings: Secondary | ICD-10-CM

## 2021-05-21 ENCOUNTER — Other Ambulatory Visit: Payer: Self-pay

## 2021-05-21 ENCOUNTER — Ambulatory Visit (INDEPENDENT_AMBULATORY_CARE_PROVIDER_SITE_OTHER): Payer: Medicare Other | Admitting: Family

## 2021-05-21 ENCOUNTER — Encounter: Payer: Self-pay | Admitting: Family

## 2021-05-21 VITALS — BP 130/60 | HR 95 | Temp 97.8°F | Ht 64.0 in | Wt 154.4 lb

## 2021-05-21 DIAGNOSIS — Z0000 Encounter for general adult medical examination without abnormal findings: Secondary | ICD-10-CM

## 2021-05-21 DIAGNOSIS — F319 Bipolar disorder, unspecified: Secondary | ICD-10-CM

## 2021-05-21 DIAGNOSIS — Z87891 Personal history of nicotine dependence: Secondary | ICD-10-CM

## 2021-05-21 DIAGNOSIS — M810 Age-related osteoporosis without current pathological fracture: Secondary | ICD-10-CM | POA: Diagnosis not present

## 2021-05-21 DIAGNOSIS — Z1329 Encounter for screening for other suspected endocrine disorder: Secondary | ICD-10-CM

## 2021-05-21 DIAGNOSIS — E785 Hyperlipidemia, unspecified: Secondary | ICD-10-CM | POA: Diagnosis not present

## 2021-05-21 NOTE — Patient Instructions (Addendum)
Order for ultrasound of abdomen to screen for abdominal aortic aneurysm  ?let us know if you dont hear back within a week in regards to an appointment being scheduled.  ? ?Please call the office in September so I may order your Cologuard ?Monitor blood pressure at home and me 5-6 reading on separate days. Goal is less than 120/80, based on newest guidelines, however we certainly want to be less than 130/80;  if persistently higher, please make sooner follow up appointment so we can recheck you blood pressure and manage/ adjust medications. ? ?Managing Your Hypertension ?Hypertension, also called high blood pressure, is when the force of the blood pressing against the walls of the arteries is too strong. Arteries are blood vessels that carry blood from your heart throughout your body. Hypertension forces the heart to work harder to pump blood and may cause the arteries to become narrow or stiff. ?Understanding blood pressure readings ?Your personal target blood pressure may vary depending on your medical conditions, your age, and other factors. A blood pressure reading includes a higher number over a lower number. Ideally, your blood pressure should be below 120/80. You should know that: ?The first, or top, number is called the systolic pressure. It is a measure of the pressure in your arteries as your heart beats. ?The second, or bottom number, is called the diastolic pressure. It is a measure of the pressure in your arteries as the heart relaxes. ?Blood pressure is classified into four stages. Based on your blood pressure reading, your health care provider may use the following stages to determine what type of treatment you need, if any. Systolic pressure and diastolic pressure are measured in a unit called mmHg. ?Normal ?Systolic pressure: below 120. ?Diastolic pressure: below 80. ?Elevated ?Systolic pressure: 120-129. ?Diastolic pressure: below 80. ?Hypertension stage 1 ?Systolic pressure: 130-139. ?Diastolic  pressure: 80-89. ?Hypertension stage 2 ?Systolic pressure: 140 or above. ?Diastolic pressure: 90 or above. ?How can this condition affect me? ?Managing your hypertension is an important responsibility. Over time, hypertension can damage the arteries and decrease blood flow to important parts of the body, including the brain, heart, and kidneys. Having untreated or uncontrolled hypertension can lead to: ?A heart attack. ?A stroke. ?A weakened blood vessel (aneurysm). ?Heart failure. ?Kidney damage. ?Eye damage. ?Metabolic syndrome. ?Memory and concentration problems. ?Vascular dementia. ?What actions can I take to manage this condition? ?Hypertension can be managed by making lifestyle changes and possibly by taking medicines. Your health care provider will help you make a plan to bring your blood pressure within a normal range. ?Nutrition ? ?Eat a diet that is high in fiber and potassium, and low in salt (sodium), added sugar, and fat. An example eating plan is called the Dietary Approaches to Stop Hypertension (DASH) diet. To eat this way: ?Eat plenty of fresh fruits and vegetables. Try to fill one-half of your plate at each meal with fruits and vegetables. ?Eat whole grains, such as whole-wheat pasta, brown rice, or whole-grain bread. Fill about one-fourth of your plate with whole grains. ?Eat low-fat dairy products. ?Avoid fatty cuts of meat, processed or cured meats, and poultry with skin. Fill about one-fourth of your plate with lean proteins such as fish, chicken without skin, beans, eggs, and tofu. ?Avoid pre-made and processed foods. These tend to be higher in sodium, added sugar, and fat. ?Reduce your daily sodium intake. Most people with hypertension should eat less than 1,500 mg of sodium a day. ?Lifestyle ? ?Work with your health care  provider to maintain a healthy body weight or to lose weight. Ask what an ideal weight is for you. ?Get at least 30 minutes of exercise that causes your heart to beat faster  (aerobic exercise) most days of the week. Activities may include walking, swimming, or biking. ?Include exercise to strengthen your muscles (resistance exercise), such as weight lifting, as part of your weekly exercise routine. Try to do these types of exercises for 30 minutes at least 3 days a week. ?Do not use any products that contain nicotine or tobacco, such as cigarettes, e-cigarettes, and chewing tobacco. If you need help quitting, ask your health care provider. ?Control any long-term (chronic) conditions you have, such as high cholesterol or diabetes. ?Identify your sources of stress and find ways to manage stress. This may include meditation, deep breathing, or making time for fun activities. ?Alcohol use ?Do not drink alcohol if: ?Your health care provider tells you not to drink. ?You are pregnant, may be pregnant, or are planning to become pregnant. ?If you drink alcohol: ?Limit how much you use to: ?0-1 drink a day for women. ?0-2 drinks a day for men. ?Be aware of how much alcohol is in your drink. In the [place] one drink equals one 12 oz bottle of beer (355 mL), one 5 oz glass of wine (148 mL), or one 1? oz glass of hard liquor (44 mL). ?Medicines ?Your health care provider may prescribe medicine if lifestyle changes are not enough to get your blood pressure under control and if: ?Your systolic blood pressure is 130 or higher. ?Your diastolic blood pressure is 80 or higher. ?Take medicines only as told by your health care provider. Follow the directions carefully. Blood pressure medicines must be taken as told by your health care provider. The medicine does not work as well when you skip doses. Skipping doses also puts you at risk for problems. ?Monitoring ?Before you monitor your blood pressure: ?Do not smoke, drink caffeinated beverages, or exercise within 30 minutes before taking a measurement. ?Use the bathroom and empty your bladder (urinate). ?Sit quietly for at least 5 minutes before taking  measurements. ?Monitor your blood pressure at home as told by your health care provider. To do this: ?Sit with your back straight and supported. ?Place your feet flat on the floor. Do not cross your legs. ?Support your arm on a flat surface, such as a table. Make sure your upper arm is at heart level. ?Each time you measure, take two or three readings one minute apart and record the results. ?You may also need to have your blood pressure checked regularly by your health care provider. ?General information ?Talk with your health care provider about your diet, exercise habits, and other lifestyle factors that may be contributing to hypertension. ?Review all the medicines you take with your health care provider because there may be side effects or interactions. ?Keep all visits as told by your health care provider. Your health care provider can help you create and adjust your plan for managing your high blood pressure. ?Where to find more information ?National Heart, Lung, and Blood Institute: PopSte[employer].iswww.nhlbi.nih.gov ?American Heart Association: www.heart.org ?Contact a health care provider if: ?You think you are having a reaction to medicines you have taken. ?You have repeated (recurrent) headaches. ?You feel dizzy. ?You have swelling in your ankles. ?You have trouble with your vision. ?Get help right away if: ?You develop a severe headache or confusion. ?You have unusual weakness or numbness, or you feel faint. ?You have  severe pain in your chest or abdomen. ?You vomit repeatedly. ?You have trouble breathing. ?These symptoms may represent a serious problem that is an emergency. Do not wait to see if the symptoms will go away. Get medical help right away. Call your local emergency services (911 in the [place] Do not drive yourself to the hospital. ?Summary ?Hypertension is when the force of blood pumping through your arteries is too strong. If this condition is not controlled, it may put you at risk for serious  complications. ?Your personal target blood pressure may vary depending on your medical conditions, your age, and other factors. For most people, a normal blood pressure is less than 120/80. ?Hypertension is managed by li

## 2021-05-21 NOTE — Assessment & Plan Note (Signed)
Chronic, stable.  Pending lipid panel.  Continue simvastatin 20 mg 

## 2021-05-21 NOTE — Progress Notes (Signed)
? ?Subjective:  ? ? Patient ID: Rachel Macias, female    DOB: [birth year], 76 y.o.   MRN: [id number] ? ?CC: Rachel Macias is a 76 y.o. female who presents today for physical exam and follow up ? ?HPI: Feels well today.  No new complaints ? ?Hyperlipidemia-compliant with simvastatin 20 mg ?Bipolar disorder-continues to follow with psychiatry and she is maintained on Depakote, Seroquel ? ?Colorectal Cancer Screening: Negative Cologuard 01/2019. ?Breast Cancer Screening: Mammogram UTD ?Cervical Cancer Screening: History of tubal ligation.  No pelvic pain or vaginal bleeding.  She is no longer screening for cervical cancer ? ?Bone Health screening/DEXA for 65+: History of osteoporosis.  Previously declined further evaluation for osteoporosis ? ?Lung Cancer Screening: Doesn't have 20 year pack year history and age > 62 years yo 37 years ? ?AAA screen for all men and women aged 59 to 85 with h/o tobacco, men 81 years older with family h/o AAA and women 4 years or older who have ever smoked or have family history of AAA. ? ?      Tetanus - utd ?       Pneumococcal - complete ?Labs: Screening labs today. ?Exercise: Gets regular exercise with walking dog daily.   ?Alcohol use:  none ?Smoking/tobacco NIO:EVOJJK smoker.   ? ? ?HISTORY:  ?Past Medical History:  ?Diagnosis Date  ? Bipolar 1 disorder ([place]) 1995  ? Dr. Cephus Shelling [phone])  ? Ex-smoker 2001  ? minimal  ? Osteoporosis 2015  ? fosamax reaction, then was on boniva IV for ~1 yr  ? Postmenopausal   ? age 28  ? Vitamin D deficiency   ?  ?Past Surgical History:  ?Procedure Laterality Date  ? CESAREAN SECTION    ? DEXA  09/2013  ? T -2.8 femur  ? TUBAL LIGATION    ? with reversal  ? ?Family History  ?Problem Relation Age of Onset  ? Thyroid disease Other   ?     runs in family  ? Diabetes Brother   ? Bipolar disorder Mother   ? Depression Mother   ? Anxiety disorder Mother   ? Stroke Neg Hx   ? CAD Neg Hx   ? Cancer Neg Hx   ? ?  ? ?ALLERGIES: Alendronate sodium and  Amoxicillin ? ?Current Outpatient Medications on File Prior to Visit  ?Medication Sig Dispense Refill  ? cholecalciferol (VITAMIN D) 1000 UNITS tablet Take 2 tablets (2,000 Units total) by mouth daily.    ? divalproex (DEPAKOTE ER) 500 MG 24 hr tablet TAKE 2 TABLETS BY MOUTH AT  BEDTIME 180 tablet 3  ? QUEtiapine (SEROQUEL) 200 MG tablet TAKE 1 TABLET BY MOUTH AT  BEDTIME 90 tablet 3  ? simvastatin (ZOCOR) 20 MG tablet TAKE 1 TABLET BY MOUTH IN  THE EVENING 90 tablet 3  ? ?No current facility-administered medications on file prior to visit.  ? ? ?Social History  ? ?Tobacco Use  ? Smoking status: Former  ?  Types: Cigarettes  ?  Quit date: 03/10/1999  ?  Years since quitting: 22.2  ? Smokeless tobacco: Never  ?Vaping Use  ? Vaping Use: Never used  ?Substance Use Topics  ? Alcohol use: No  ? Drug use: No  ? ? ?Review of Systems  ?Constitutional:  Negative for chills, fever and unexpected weight change.  ?HENT:  Negative for congestion.   ?Respiratory:  Negative for cough.   ?Cardiovascular:  Negative for chest pain, palpitations and leg swelling.  ?Gastrointestinal:  Negative for  nausea and vomiting.  ?Genitourinary:  Negative for pelvic pain and vaginal bleeding.  ?Musculoskeletal:  Negative for arthralgias and myalgias.  ?Skin:  Negative for rash.  ?Neurological:  Negative for headaches.  ?Hematological:  Negative for adenopathy.  ?Psychiatric/Behavioral:  Negative for confusion.   ?   ?Objective:  ?  ?BP 130/60 (BP Location: Left Arm, Patient Position: Sitting, Cuff Size: Normal)   Pulse 95   Temp 97.8 ?F (36.6 ?C) (Oral)   Ht '5\' 4"'  (1.626 m)   Wt 154 lb 6.4 oz (70 kg)   SpO2 95%   BMI 26.50 kg/m?  ? ?BP Readings from Last 3 Encounters:  ?05/21/21 130/60  ?05/08/20 116/78  ?05/08/19 100/62  ? ?Wt Readings from Last 3 Encounters:  ?05/21/21 154 lb 6.4 oz (70 kg)  ?01/21/21 152 lb (68.9 kg)  ?05/08/20 156 lb 12.8 oz (71.1 kg)  ? ? ?Physical Exam ?Vitals reviewed.  ?Constitutional:   ?   Appearance: She is  well-developed.  ?Eyes:  ?   Conjunctiva/sclera: Conjunctivae normal.  ?Neck:  ?   Thyroid: No thyroid mass or thyromegaly.  ?Cardiovascular:  ?   Rate and Rhythm: Normal rate and regular rhythm.  ?   Pulses: Normal pulses.  ?   Heart sounds: Normal heart sounds.  ?Pulmonary:  ?   Effort: Pulmonary effort is normal.  ?   Breath sounds: Normal breath sounds. No wheezing, rhonchi or rales.  ?Chest:  ?Breasts: ?   Breasts are symmetrical.  ?   Right: No inverted nipple, mass, nipple discharge, skin change or tenderness.  ?   Left: No inverted nipple, mass, nipple discharge, skin change or tenderness.  ?Lymphadenopathy:  ?   Head:  ?   Right side of head: No submental, submandibular, tonsillar, preauricular, posterior auricular or occipital adenopathy.  ?   Left side of head: No submental, submandibular, tonsillar, preauricular, posterior auricular or occipital adenopathy.  ?   Cervical: No cervical adenopathy.  ?   Right cervical: No superficial, deep or posterior cervical adenopathy. ?   Left cervical: No superficial, deep or posterior cervical adenopathy.  ?Skin: ?   General: Skin is warm and dry.  ?Neurological:  ?   Mental Status: She is alert.  ?Psychiatric:     ?   Speech: Speech normal.     ?   Behavior: Behavior normal.     ?   Thought Content: Thought content normal.  ? ? ?   ?Assessment & Plan:  ? ?Problem List Items Addressed This Visit   ? ?  ? Musculoskeletal and Integument  ? Osteoporosis  ? Relevant Orders  ? VITAMIN D 25 Hydroxy (Vit-D Deficiency, Fractures)  ?  ? Other  ? Bipolar 1 disorder ([place])  ? Relevant Orders  ? CBC w/Diff  ? Dyslipidemia  ?  Chronic, stable.  Pending lipid panel.  Continue simvastatin 20 mg ?  ?  ? Routine physical examination - Primary  ?  Clinical breast exam performed today.  She will be due for Cologuard in October of this year.  She will call me the month prior so I may order this test.  As she is a former smoker, I have ordered AAA screening.  She declines any further  screening for bone density.  In the absence of complaints, she declines further screening for cervical cancer.  Encouraged more vigorous exercise.  Systolic blood pressure ever so elevated today which is unusual for her; she will monitor at home.  Encouraged low-sodium  diet.  She will let me know how she is doing ?  ?  ? Relevant Orders  ? Lipid panel  ? Comp Met (CMET)  ? TSH  ? CBC w/Diff  ? ?Other Visit Diagnoses   ? ? Former smoker      ? Relevant Orders  ? US AORTA DUPLEX LIMITED  ? ?  ? ? ? ?I am having Rachel Macias maintain her cholecalciferol, QUEtiapine, simvastatin, and divalproex. ? ? ?No orders of the defined types were placed in this encounter. ? ? ?Return precautions given.  ? ?Risks, benefits, and alternatives of the medications and treatment plan prescribed today were discussed, and patient expressed understanding.  ? ?Education regarding symptom management and diagnosis given to patient on AVS.  ? ?Continue to follow with Burnard Hawthorne, FNP for routine health maintenance.  ? ?Margarito Courser and I agreed with plan.  ? ?Mable Paris, FNP ? ? ? ?

## 2021-05-21 NOTE — Assessment & Plan Note (Signed)
Clinical breast exam performed today.  She will be due for Cologuard in October of this year.  She will call me the month prior so I may order this test.  As she is a former smoker, I have ordered AAA screening.  She declines any further screening for bone density.  In the absence of complaints, she declines further screening for cervical cancer.  Encouraged more vigorous exercise.  Systolic blood pressure ever so elevated today which is unusual for her; she will monitor at home.  Encouraged low-sodium diet.  She will let me know how she is doing ?

## 2021-05-22 LAB — CBC WITH DIFFERENTIAL/PLATELET
Basophils Absolute: 0.1 10*3/uL (ref 0.0–0.1)
Basophils Relative: 0.9 % (ref 0.0–3.0)
Eosinophils Absolute: 0.1 10*3/uL (ref 0.0–0.7)
Eosinophils Relative: 1.6 % (ref 0.0–5.0)
HCT: 39.5 % (ref 36.0–46.0)
Hemoglobin: 13.5 g/dL (ref 12.0–15.0)
Lymphocytes Relative: 37 % (ref 12.0–46.0)
Lymphs Abs: 2.2 10*3/uL (ref 0.7–4.0)
MCHC: 34.2 g/dL (ref 30.0–36.0)
MCV: 97.1 fl (ref 78.0–100.0)
Monocytes Absolute: 0.5 10*3/uL (ref 0.1–1.0)
Monocytes Relative: 8.4 % (ref 3.0–12.0)
Neutro Abs: 3.1 10*3/uL (ref 1.4–7.7)
Neutrophils Relative %: 52.1 % (ref 43.0–77.0)
Platelets: 221 10*3/uL (ref 150.0–400.0)
RBC: 4.07 Mil/uL (ref 3.87–5.11)
RDW: 13 % (ref 11.5–15.5)
WBC: 6 10*3/uL (ref 4.0–10.5)

## 2021-05-22 LAB — TSH: TSH: 1.76 u[IU]/mL (ref 0.35–5.50)

## 2021-05-22 LAB — COMPREHENSIVE METABOLIC PANEL
ALT: 9 U/L (ref 0–35)
AST: 12 U/L (ref 0–37)
Albumin: 4.4 g/dL (ref 3.5–5.2)
Alkaline Phosphatase: 59 U/L (ref 39–117)
BUN: 19 mg/dL (ref 6–23)
CO2: 30 mEq/L (ref 19–32)
Calcium: 9.4 mg/dL (ref 8.4–10.5)
Chloride: 101 mEq/L (ref 96–112)
Creatinine, Ser: 0.99 mg/dL (ref 0.40–1.20)
GFR: 55.69 mL/min — ABNORMAL LOW (ref >60.00)
Glucose, Bld: 103 mg/dL — ABNORMAL HIGH (ref 70–99)
Potassium: 4.1 mEq/L (ref 3.5–5.1)
Sodium: 139 mEq/L (ref 135–145)
Total Bilirubin: 0.4 mg/dL (ref 0.2–1.2)
Total Protein: 7.4 g/dL (ref 6.0–8.3)

## 2021-05-22 LAB — LIPID PANEL
Cholesterol: 163 mg/dL (ref 0–200)
HDL: 54.5 mg/dL (ref >39.00)
LDL Cholesterol: 82 mg/dL (ref 0–99)
NonHDL: 108.89
Total CHOL/HDL Ratio: 3
Triglycerides: 135 mg/dL (ref 0.0–149.0)
VLDL: 27 mg/dL (ref 0.0–40.0)

## 2021-05-22 LAB — VITAMIN D 25 HYDROXY (VIT D DEFICIENCY, FRACTURES): VITD: 21.4 ng/mL — ABNORMAL LOW (ref 30.00–100.00)

## 2021-05-23 ENCOUNTER — Other Ambulatory Visit: Payer: Self-pay | Admitting: Family

## 2021-05-23 DIAGNOSIS — R899 Unspecified abnormal finding in specimens from other organs, systems and tissues: Secondary | ICD-10-CM

## 2021-05-26 ENCOUNTER — Other Ambulatory Visit: Payer: Self-pay | Admitting: Psychiatry

## 2021-05-26 DIAGNOSIS — F3110 Bipolar disorder, current episode manic without psychotic features, unspecified: Secondary | ICD-10-CM

## 2021-05-28 ENCOUNTER — Encounter: Payer: Self-pay | Admitting: Family

## 2021-06-11 ENCOUNTER — Encounter: Payer: Self-pay | Admitting: Family

## 2021-06-16 ENCOUNTER — Other Ambulatory Visit (INDEPENDENT_AMBULATORY_CARE_PROVIDER_SITE_OTHER): Payer: Medicare Other

## 2021-06-16 DIAGNOSIS — R899 Unspecified abnormal finding in specimens from other organs, systems and tissues: Secondary | ICD-10-CM | POA: Diagnosis not present

## 2021-06-16 LAB — BASIC METABOLIC PANEL
BUN: 18 mg/dL (ref 6–23)
CO2: 30 mEq/L (ref 19–32)
Calcium: 9.3 mg/dL (ref 8.4–10.5)
Chloride: 98 mEq/L (ref 96–112)
Creatinine, Ser: 0.94 mg/dL (ref 0.40–1.20)
GFR: 59.24 mL/min — ABNORMAL LOW (ref >60.00)
Glucose, Bld: 92 mg/dL (ref 70–99)
Potassium: 3.8 mEq/L (ref 3.5–5.1)
Sodium: 136 mEq/L (ref 135–145)

## 2021-06-18 ENCOUNTER — Other Ambulatory Visit: Payer: Self-pay

## 2021-06-18 DIAGNOSIS — E559 Vitamin D deficiency, unspecified: Secondary | ICD-10-CM

## 2021-07-23 ENCOUNTER — Encounter: Payer: Self-pay | Admitting: Psychiatry

## 2021-07-23 ENCOUNTER — Ambulatory Visit (INDEPENDENT_AMBULATORY_CARE_PROVIDER_SITE_OTHER): Payer: Medicare Other | Admitting: Psychiatry

## 2021-07-23 VITALS — BP 138/83 | HR 102 | Temp 97.9°F | Wt 153.2 lb

## 2021-07-23 DIAGNOSIS — F3174 Bipolar disorder, in full remission, most recent episode manic: Secondary | ICD-10-CM | POA: Diagnosis not present

## 2021-07-23 NOTE — Progress Notes (Signed)
[facility] MD OP Progress Note  07/23/2021 1:57 PM [name]  MRN:  [id number]  Chief Complaint:  Chief Complaint  Patient presents with   Follow-up: 76 year old Caucasian female who has a history of bipolar disorder, presented for medication management.   HPI: Rachel Macias is a 76 year old Caucasian female, widowed, lives in [place], has a history of bipolar disorder, insomnia was evaluated in office today.  Patient is currently stable with regards to her mood.  Denies any significant sadness, crying spells, nervousness.  Reports she sleeps good at night.  She however has been taking naps during the day mostly because of boredom.  She however reports she is mindful about not taking too long naps.  She also looks forward to getting out more and taking walks and may be getting into some kind of exercise routine.  Patient denies any suicidality, homicidality or perceptual disturbances.  Currently compliant on medications.  Denies side effects.  Denies any other concerns today.  Visit Diagnosis:    ICD-10-CM   1. Bipolar disorder, in full remission, most recent episode manic ([facility])  F31.74       Past Psychiatric History: Reviewed past psychiatric history from progress note on 06/10/2017.  Past trials of Prozac, lithium, Zyprexa.   Past Medical History:  Past Medical History:  Diagnosis Date   Bipolar 1 disorder ([facility]) 1995   Dr. Caryn Section [phone])   Ex-smoker July 05, 1999   minimal   Osteoporosis 07/04/13   fosamax reaction, then was on boniva IV for ~1 yr   Postmenopausal    age 61   Vitamin D deficiency     Past Surgical History:  Procedure Laterality Date   CESAREAN SECTION     DEXA  09/2013   T -2.8 femur   TUBAL LIGATION     with reversal    Family Psychiatric History:  Reviewed family psychiatric history from progress note on 06/10/2017.  Family History:  Family History  Problem Relation Age of Onset   Thyroid disease Other        runs in family   Diabetes Brother     Bipolar disorder Mother    Depression Mother    Anxiety disorder Mother    Stroke Neg Hx    CAD Neg Hx    Cancer Neg Hx     Social History: Reviewed social history from progress note on 06/10/2017. Social History   Socioeconomic History   Marital status: Widowed    Spouse name: Not on file   Number of children: 1   Years of education: Not on file   Highest education level: Associate degree: occupational, Scientist, product/process development, or vocational program  Occupational History    Comment: retired  Tobacco Use   Smoking status: Former    Types: Cigarettes    Quit date: 03/10/1999    Years since quitting: 22.3   Smokeless tobacco: Never  Vaping Use   Vaping Use: Never used  Substance and Sexual Activity   Alcohol use: No   Drug use: No   Sexual activity: Not Currently  Other Topics Concern   Not on file  Social History Narrative   Lives alone with her dog (reported Vanessa Kick 07/07/22), widow; husband died from brain cancer around 07-04-09.    Had lived in [place], [place]. Moved to [place] to be closer to family.    son died at age 106yo (hit by car).   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)   Edu: 13 yrs   Activity: no regular exercise   Diet: good water, fruits/vegetables some   Social Determinants of Health   Financial Resource Strain: Low Risk    Difficulty of Paying Living Expenses: Not hard at all  Food Insecurity: No Food Insecurity   Worried About Programme researcher, broadcasting/film/videounning Out of Food in the Last Year: Never true   Ran Out of Food in the Last Year: Never true  Transportation Needs: No Transportation Needs   Lack of Transportation (Medical): No   Lack of Transportation (Non-Medical): No  Physical Activity: Sufficiently Active   Days of Exercise per Week: 5 days   Minutes of Exercise per Session: 30 min  Stress: No Stress Concern Present   Feeling of Stress : Not at all  Social Connections: Moderately Isolated   Frequency of  Communication with Friends and Family: Three times a week   Frequency of Social Gatherings with Friends and Family: Once a week   Attends Religious Services: More than 4 times per year   Active Member of [employer] or Organizations: No   Attends BankerClub or Organization Meetings: Never   Marital Status: Widowed    Allergies:  Allergies  Allergen Reactions   Alendronate Sodium Other (See Comments)    GI distress GI distress GI distress   Amoxicillin Hives    Metabolic Disorder Labs: Lab Results  Component Value Date   HGBA1C 5.0 01/13/2018   Lab Results  Component Value Date   PROLACTIN 7.7 12/17/2020   PROLACTIN 11.7 01/13/2018   Lab Results  Component Value Date   CHOL 163 05/21/2021   TRIG 135.0 05/21/2021   HDL 54.50 05/21/2021   CHOLHDL 3 05/21/2021   VLDL 27.0 05/21/2021   LDLCALC 82 05/21/2021   LDLCALC 97 05/08/2020   Lab Results  Component Value Date   TSH 1.76 05/21/2021   TSH 1.50 05/08/2020    Therapeutic Level Labs: Lab Results  Component Value Date   LITHIUM 1.40 (H) 08/15/2016   LITHIUM 2.10 (HH) 08/15/2016   Lab Results  Component Value Date   VALPROATE 63 12/17/2020   VALPROATE 82 12/22/2019   No components found for:  CBMZ  Current Medications: Current Outpatient Medications  Medication Sig Dispense Refill   cholecalciferol (VITAMIN D) 1000 UNITS tablet Take 2 tablets (2,000 Units total) by mouth daily.     divalproex (DEPAKOTE ER) 500 MG 24 hr tablet TAKE 2 TABLETS BY MOUTH AT  BEDTIME 180 tablet 3   QUEtiapine (SEROQUEL) 200 MG tablet TAKE 1 TABLET BY MOUTH AT  BEDTIME 90 tablet 3   simvastatin (ZOCOR) 20 MG tablet TAKE 1 TABLET BY MOUTH IN  THE EVENING 90 tablet 3   No current facility-administered medications for this visit.     Musculoskeletal: Strength & Muscle Tone: within normal limits Gait & Station: normal Patient leans: N/A  Psychiatric Specialty Exam: Review of Systems  Psychiatric/Behavioral:  Negative for agitation,  behavioral problems, confusion, decreased concentration, dysphoric mood, hallucinations, self-injury, sleep disturbance and suicidal ideas. The patient is not nervous/anxious and is not hyperactive.   All other systems reviewed and are negative.  Blood pressure 138/83, pulse (!) 102, temperature 97.9 F (36.6 C), temperature source Temporal, weight 153 lb 3.2 oz (69.5 kg).Body mass index is 26.3 kg/m.  General Appearance: Casual  Eye Contact:  Fair  Speech:  Clear and Coherent  Volume:  Normal  Mood:  Euthymic  Affect:  Congruent  Thought Process:  Goal Directed and Descriptions  of Associations: Intact  Orientation:  Full (Time, Place, and Person)  Thought Content: Logical   Suicidal Thoughts:  No  Homicidal Thoughts:  No  Memory:  Immediate;   Fair Recent;   Fair Remote;   Fair  Judgement:  Fair  Insight:  Fair  Psychomotor Activity:  Normal  Concentration:  Concentration: Fair and Attention Span: Fair  Recall:  [employer] of Knowledge: Fair  Language: Fair  Akathisia:  No  Handed:  Right  AIMS (if indicated): done  Assets:  Communication Skills Desire for Improvement Housing Social Support Talents/Skills Transportation  ADL's:  Intact  Cognition: WNL  Sleep:  Fair   Screenings: AIMS    Flowsheet Row Office Visit from 07/23/2021 in [facility] Office Visit from 12/17/2020 in [facility]  AIMS Total Score 0 0      GAD-7    Flowsheet Row Office Visit from 05/21/2021 in [facility]  Total GAD-7 Score 0      PHQ2-9    Flowsheet Row Office Visit from 07/23/2021 in [facility] Office Visit from 03/26/2021 in [facility] Clinical Support from 01/21/2021 in [facility] Office Visit from 12/17/2020 in [facility] Video Visit from 09/16/2020 in [facility]  PHQ-2  Total Score 0 0 0 0 0  PHQ-9 Total Score -- -- -- -- 0      Flowsheet Row Video Visit from 09/16/2020 in [facility]  C-SSRS RISK CATEGORY No Risk        Assessment and Plan: Philomina Leon is a 76 year old Caucasian female who has a history of bipolar disorder, history of lithium toxicity was evaluated in office today.  Patient is currently stable.  Plan as noted below.  Plan Bipolar disorder in remission Depakote ER 1000 mg p.o. nightly Seroquel 200 mg p.o. nightly Depakote level-reviewed and discussed-12/17/2020-63-therapeutic.  Follow-up in clinic in 3 to 4 months or sooner if needed.   This note was generated in part or whole with voice recognition software. Voice recognition is usually quite accurate but there are transcription errors that can and very often do occur. I apologize for any typographical errors that were not detected and corrected.     Jomarie Longs, MD 07/24/2021, 8:22 AM

## 2021-08-08 ENCOUNTER — Other Ambulatory Visit: Payer: Self-pay | Admitting: Family

## 2021-08-08 DIAGNOSIS — E785 Hyperlipidemia, unspecified: Secondary | ICD-10-CM

## 2021-08-11 ENCOUNTER — Other Ambulatory Visit: Payer: Self-pay | Admitting: Family

## 2021-08-11 ENCOUNTER — Other Ambulatory Visit (INDEPENDENT_AMBULATORY_CARE_PROVIDER_SITE_OTHER): Payer: Medicare Other

## 2021-08-11 DIAGNOSIS — E559 Vitamin D deficiency, unspecified: Secondary | ICD-10-CM | POA: Diagnosis not present

## 2021-08-11 DIAGNOSIS — E785 Hyperlipidemia, unspecified: Secondary | ICD-10-CM

## 2021-08-11 LAB — VITAMIN D 25 HYDROXY (VIT D DEFICIENCY, FRACTURES): VITD: 40.75 ng/mL (ref 30.00–100.00)

## 2021-10-23 ENCOUNTER — Other Ambulatory Visit: Payer: Self-pay | Admitting: Psychiatry

## 2021-10-23 DIAGNOSIS — F5105 Insomnia due to other mental disorder: Secondary | ICD-10-CM

## 2021-11-24 ENCOUNTER — Ambulatory Visit (INDEPENDENT_AMBULATORY_CARE_PROVIDER_SITE_OTHER): Payer: Medicare Other | Admitting: Psychiatry

## 2021-11-24 ENCOUNTER — Encounter: Payer: Self-pay | Admitting: Psychiatry

## 2021-11-24 ENCOUNTER — Other Ambulatory Visit
Admission: RE | Admit: 2021-11-24 | Discharge: 2021-11-24 | Disposition: A | Discharge status: Home / Self Care | Payer: Medicare Other | Source: Ambulatory Visit | Attending: Psychiatry | Admitting: Psychiatry

## 2021-11-24 VITALS — BP 134/83 | HR 101 | Temp 98.2°F | Wt 158.2 lb

## 2021-11-24 DIAGNOSIS — Z79899 Other long term (current) drug therapy: Secondary | ICD-10-CM

## 2021-11-24 DIAGNOSIS — F3174 Bipolar disorder, in full remission, most recent episode manic: Secondary | ICD-10-CM

## 2021-11-24 LAB — HEPATIC FUNCTION PANEL
ALT: 12 U/L (ref 0–44)
AST: 20 U/L (ref 15–41)
Albumin: 3.8 g/dL (ref 3.5–5.0)
Alkaline Phosphatase: 46 U/L (ref 38–126)
Bilirubin, Direct: <0.1 mg/dL (ref 0.0–0.2)
Total Bilirubin: 0.4 mg/dL (ref 0.3–1.2)
Total Protein: 7.7 g/dL (ref 6.5–8.1)

## 2021-11-24 LAB — VALPROIC ACID LEVEL: Valproic Acid Lvl: 76 ug/mL (ref 50.0–100.0)

## 2021-11-24 NOTE — Progress Notes (Signed)
[facility] MD OP Progress Note  11/24/2021 10:44 AM Rachel Macias  MRN:  [id number]  Chief Complaint:  Chief Complaint  Patient presents with   Follow-up: 76 year old Caucasian female with history of bipolar disorder, presented for medication management.   HPI: Rachel Macias is a 76 year old Caucasian female, widowed, lives in [place], has a history of bipolar disorder, insomnia was evaluated in office today.  Patient today reports she is currently doing well.  Denies any significant depression, hypomanic or manic symptoms.  Denies any mood swings or irritability.  Reports sleep as good.  Reports appetite is fair.  Currently compliant on medications like Depakote, Seroquel.  Denies side effects.  Agreeable to get Depakote levels done since it has been almost a year.  Patient with elevated blood pressure reading, reports she has been logging her blood pressure at home and it has been within normal limits.  Continues to follow-up with her primary care provider.  Denies any suicidality, homicidality or perceptual disturbances.  Denies any other concerns today.  Visit Diagnosis:    ICD-10-CM   1. Bipolar disorder, in full remission, most recent episode manic (HCC)  F31.74 Valproic acid level    Hepatic function panel    2. High risk medication use  Z79.899 Valproic acid level    Hepatic function panel      Past Psychiatric History: Reviewed past psychiatric history from progress note on 06/10/2017.  Past trials of Prozac, lithium, Zyprexa.  Past Medical History:  Past Medical History:  Diagnosis Date   Bipolar 1 disorder (HCC) 1995   Dr. Cephus Shelling [phone])   Ex-smoker Jul 05, 1999   minimal   Osteoporosis 2013/07/04   fosamax reaction, then was on boniva IV for ~1 yr   Postmenopausal    age 74   Vitamin D deficiency     Past Surgical History:  Procedure Laterality Date   CESAREAN SECTION     DEXA  09/2013   T -2.8 femur   TUBAL LIGATION     with reversal    Family Psychiatric  History: Reviewed family psychiatric history from progress note on 06/10/2017.  Family History:  Family History  Problem Relation Age of Onset   Thyroid disease Other        runs in family   Diabetes Brother    Bipolar disorder Mother    Depression Mother    Anxiety disorder Mother    Stroke Neg Hx    CAD Neg Hx    Cancer Neg Hx     Social History: Reviewed social history from progress note on 06/10/2017. Social History   Socioeconomic History   Marital status: Widowed    Spouse name: Not on file   Number of children: 1   Years of education: Not on file   Highest education level: Associate degree: occupational, Hotel manager, or vocational program  Occupational History    Comment: retired  Tobacco Use   Smoking status: Former    Types: Cigarettes    Quit date: 03/10/1999    Years since quitting: 22.7   Smokeless tobacco: Never  Vaping Use   Vaping Use: Never used  Substance and Sexual Activity   Alcohol use: No   Drug use: No   Sexual activity: Not Currently  Other Topics Concern   Not on file  Social History Narrative   Lives alone with her dog (reported jan 21.2023), widow; husband died from brain cancer around 04-Jul-2009.    Had lived in [place], [place]. Moved to [place] to be closer  to family.    son died at age 28yo (hit by car).   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)   Edu: 13 yrs   Activity: no regular exercise   Diet: good water, fruits/vegetables some   Social Determinants of Health   Financial Resource Strain: Low Risk  (01/21/2021)   Overall Financial Resource Strain (CARDIA)    Difficulty of Paying Living Expenses: Not hard at all  Food Insecurity: No Food Insecurity (01/21/2021)   Hunger Vital Sign    Worried About Running Out of Food in the Last Year: Never true    Ran Out of Food in the Last Year: Never true  Transportation Needs: No Transportation Needs (01/21/2021)   PRAPARE -  Administrator, Civil Service (Medical): No    Lack of Transportation (Non-Medical): No  Physical Activity: Sufficiently Active (01/21/2021)   Exercise Vital Sign    Days of Exercise per Week: 5 days    Minutes of Exercise per Session: 30 min  Stress: No Stress Concern Present (01/21/2021)   [employer] of Occupational Health - Occupational Stress Questionnaire    Feeling of Stress : Not at all  Social Connections: Moderately Isolated (01/21/2021)   Social Connection and Isolation Panel [NHANES]    Frequency of Communication with Friends and Family: Three times a week    Frequency of Social Gatherings with Friends and Family: Once a week    Attends Religious Services: More than 4 times per year    Active Member of [employer] or Organizations: No    Attends Banker Meetings: Never    Marital Status: Widowed    Allergies:  Allergies  Allergen Reactions   Alendronate Sodium Other (See Comments)    GI distress GI distress GI distress   Amoxicillin Hives    Metabolic Disorder Labs: Lab Results  Component Value Date   HGBA1C 5.0 01/13/2018   Lab Results  Component Value Date   PROLACTIN 7.7 12/17/2020   PROLACTIN 11.7 01/13/2018   Lab Results  Component Value Date   CHOL 163 05/21/2021   TRIG 135.0 05/21/2021   HDL 54.50 05/21/2021   CHOLHDL 3 05/21/2021   VLDL 27.0 05/21/2021   LDLCALC 82 05/21/2021   LDLCALC 97 05/08/2020   Lab Results  Component Value Date   TSH 1.76 05/21/2021   TSH 1.50 05/08/2020    Therapeutic Level Labs: Lab Results  Component Value Date   LITHIUM 1.40 (H) 08/15/2016   LITHIUM 2.10 (HH) 08/15/2016   Lab Results  Component Value Date   VALPROATE 63 12/17/2020   VALPROATE 82 12/22/2019   No results found for: "CBMZ"  Current Medications: Current Outpatient Medications  Medication Sig Dispense Refill   cholecalciferol (VITAMIN D) 1000 UNITS tablet Take 2 tablets (2,000 Units total) by mouth daily.      divalproex (DEPAKOTE ER) 500 MG 24 hr tablet TAKE 2 TABLETS BY MOUTH AT  BEDTIME 180 tablet 3   QUEtiapine (SEROQUEL) 200 MG tablet TAKE 1 TABLET BY MOUTH AT  BEDTIME 90 tablet 3   simvastatin (ZOCOR) 20 MG tablet TAKE 1 TABLET BY MOUTH IN  THE EVENING 90 tablet 3   No current facility-administered medications for this visit.     Musculoskeletal: Strength & Muscle Tone: within normal limits Gait & Station: normal Patient leans: N/A  Psychiatric Specialty Exam: Review of Systems  Psychiatric/Behavioral: Negative.    All  other systems reviewed and are negative.   Blood pressure 134/83, pulse (!) 101, temperature 98.2 F (36.8 C), temperature source Oral, weight 158 lb 3.2 oz (71.8 kg).Body mass index is 27.15 kg/m.  General Appearance: Casual  Eye Contact:  Fair  Speech:  Clear and Coherent  Volume:  Normal  Mood:  Euthymic  Affect:  Congruent  Thought Process:  Goal Directed and Descriptions of Associations: Intact  Orientation:  Full (Time, Place, and Person)  Thought Content: Logical   Suicidal Thoughts:  No  Homicidal Thoughts:  No  Memory:  Immediate;   Fair Recent;   Fair Remote;   Fair  Judgement:  Fair  Insight:  Fair  Psychomotor Activity:  Normal  Concentration:  Concentration: Fair and Attention Span: Fair  Recall:  [employer] of Knowledge: Fair  Language: Fair  Akathisia:  No  Handed:  Right  AIMS (if indicated): done  Assets:  Communication Skills Desire for Improvement Housing Social Support Talents/Skills Transportation Vocational/Educational  ADL's:  Intact  Cognition: WNL  Sleep:  Fair   Screenings: AIMS    Flowsheet Row Office Visit from 11/24/2021 in [facility] Office Visit from 07/23/2021 in [facility] Office Visit from 12/17/2020 in [facility]  AIMS Total Score 0 0 0      GAD-7    Flowsheet Row Office Visit from 11/24/2021 in [facility] Office Visit from 05/21/2021 in [facility]  Total GAD-7 Score 0 0      PHQ2-9    Flowsheet Row Office Visit from 11/24/2021 in [facility] Office Visit from 07/23/2021 in [facility] Office Visit from 03/26/2021 in [facility] Clinical Support from 01/21/2021 in [facility]  Office Visit from 12/17/2020 in [facility]  PHQ-2 Total Score 0 0 0 0 0      Flowsheet Row Office Visit from 11/24/2021 in [facility] Video Visit from 09/16/2020 in [facility]  C-SSRS RISK CATEGORY No Risk No Risk        Assessment and Plan: Rachel Macias is a 76 year old Caucasian female who has a history of bipolar disorder, history of lithium toxicity was evaluated in office today.  Patient is currently stable.  Plan Bipolar disorder in remission Depakote ER 1000 mg p.o. nightly Seroquel 200 mg p.o. nightly Depakote level-12/17/2020-63-therapeutic.  High risk medication use-will order Depakote level, LFT.  Patient to get labs done.  Patient with elevated blood pressure reading in session, blood pressure was repeated-patient to follow up with primary care provider as needed.  Follow-up in clinic in 4 to 5 months or sooner if needed.   This note was generated in part or whole with voice recognition software. Voice recognition is usually quite accurate but there are transcription errors that can and very often do occur. I apologize for any typographical errors that were not detected and corrected.     Jomarie Longs, MD 11/24/2021, 10:44 AM

## 2021-12-08 ENCOUNTER — Encounter: Payer: Self-pay | Admitting: Family

## 2021-12-08 DIAGNOSIS — Z23 Encounter for immunization: Secondary | ICD-10-CM | POA: Diagnosis not present

## 2022-01-12 ENCOUNTER — Other Ambulatory Visit: Payer: Self-pay | Admitting: Family

## 2022-01-12 DIAGNOSIS — Z1231 Encounter for screening mammogram for malignant neoplasm of breast: Secondary | ICD-10-CM

## 2022-01-22 ENCOUNTER — Ambulatory Visit (INDEPENDENT_AMBULATORY_CARE_PROVIDER_SITE_OTHER): Payer: Medicare Other

## 2022-01-22 VITALS — Ht 64.0 in | Wt 158.0 lb

## 2022-01-22 DIAGNOSIS — Z0000 Encounter for general adult medical examination without abnormal findings: Secondary | ICD-10-CM | POA: Diagnosis not present

## 2022-01-22 NOTE — Progress Notes (Signed)
Subjective:   Rachel Macias is a 76 y.o. female who presents for Medicare Annual (Subsequent) preventive examination.  Review of Systems    No ROS.  Medicare Wellness Virtual Visit.  Visual/audio telehealth visit, UTA vital signs.   See social history for additional risk factors.   Cardiac Risk Factors include: advanced age (>80men, >31 women)     Objective:    Today's Vitals   01/22/22 1135  Weight: 158 lb (71.7 kg)  Height: 5\' 4"  (1.626 m)   Body mass index is 27.12 kg/m.     01/22/2022   11:44 AM 01/21/2021   10:41 AM 01/19/2020   10:44 AM 01/18/2019   10:46 AM 05/17/2018   10:29 AM 02/08/2018    9:25 AM 01/12/2018    2:58 PM  Advanced Directives  Does Patient Have a Medical Advance Directive? Yes Yes Yes Yes   Yes  Type of Paramedic of [place];Living will [place];Living will [place];Living will [place];Living will   [place];Living will  Does patient want to make changes to medical advance directive? No - Patient declined No - Patient declined No - Patient declined No - Patient declined   No - Patient declined  Copy of [place] in Chart? Yes - validated most recent copy scanned in chart (See row information) Yes - validated most recent copy scanned in chart (See row information) Yes - validated most recent copy scanned in chart (See row information) Yes - validated most recent copy scanned in chart (See row information)   Yes - validated most recent copy scanned in chart (See row information)  Would patient like information on creating a medical advance directive?            Information is confidential and restricted. Go to Review Flowsheets to unlock data.    Current Medications (verified) Outpatient Encounter Medications as of 01/22/2022  Medication Sig   cholecalciferol (VITAMIN D) 1000 UNITS tablet Take 2 tablets (2,000 Units total) by  mouth daily.   divalproex (DEPAKOTE ER) 500 MG 24 hr tablet TAKE 2 TABLETS BY MOUTH AT  BEDTIME   QUEtiapine (SEROQUEL) 200 MG tablet TAKE 1 TABLET BY MOUTH AT  BEDTIME   simvastatin (ZOCOR) 20 MG tablet TAKE 1 TABLET BY MOUTH IN  THE EVENING   No facility-administered encounter medications on file as of 01/22/2022.    Allergies (verified) Alendronate sodium and Amoxicillin   History: Past Medical History:  Diagnosis Date   Bipolar 1 disorder (HCC) 1995   Dr. Cephus Shelling [phone])   Ex-smoker 2001   minimal   Osteoporosis 2015   fosamax reaction, then was on boniva IV for ~1 yr   Postmenopausal    age 27   Vitamin D deficiency    Past Surgical History:  Procedure Laterality Date   CESAREAN SECTION     DEXA  09/2013   T -2.8 femur   TUBAL LIGATION     with reversal   Family History  Problem Relation Age of Onset   Thyroid disease Other        runs in family   Diabetes Brother    Bipolar disorder Mother    Depression Mother    Anxiety disorder Mother    Stroke Neg Hx    CAD Neg Hx    Cancer Neg Hx    Social History   Socioeconomic History   Marital status: Widowed  Spouse name: Not on file   Number of children: 1   Years of education: Not on file   Highest education level: Associate degree: occupational, Scientist, product/process development, or vocational program  Occupational History    Comment: retired  Tobacco Use   Smoking status: Former    Types: Cigarettes    Quit date: 03/10/1999    Years since quitting: 22.8   Smokeless tobacco: Never  Vaping Use   Vaping Use: Never used  Substance and Sexual Activity   Alcohol use: No   Drug use: No   Sexual activity: Not Currently  Other Topics Concern   Not on file  Social History Narrative   Lives alone with her dog (reported Vanessa Kick 07/22/22), widow; husband died from brain cancer around 07/08/2009.    Had lived in [place], [place]. Moved to [place] to be closer to family.    son died at age 76yo (hit by car).   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)   Edu: 13 yrs   Activity: no regular exercise   Diet: good water, fruits/vegetables some   Social Determinants of Health   Financial Resource Strain: Low Risk  (01/22/2022)   Overall Financial Resource Strain (CARDIA)    Difficulty of Paying Living Expenses: Not hard at all  Food Insecurity: No Food Insecurity (01/22/2022)   Hunger Vital Sign    Worried About Running Out of Food in the Last Year: Never true    Ran Out of Food in the Last Year: Never true  Transportation Needs: No Transportation Needs (01/22/2022)   PRAPARE - Administrator, Civil Service (Medical): No    Lack of Transportation (Non-Medical): No  Physical Activity: Insufficiently Active (01/22/2022)   Exercise Vital Sign    Days of Exercise per Week: 3 days    Minutes of Exercise per Session: 30 min  Stress: No Stress Concern Present (01/22/2022)   [employer] of Occupational Health - Occupational Stress Questionnaire    Feeling of Stress : Not at all  Social Connections: Moderately Isolated (01/22/2022)   Social Connection and Isolation Panel [NHANES]    Frequency of Communication with Friends and Family: Three times a week    Frequency of Social Gatherings with Friends and Family: Once a week    Attends Religious Services: More than 4 times per year    Active Member of [employer] or Organizations: No    Attends Banker Meetings: Never    Marital Status: Widowed    Tobacco Counseling Counseling given: Not Answered   Clinical Intake:  Pre-visit preparation completed: Yes        Diabetes: No  How often do you need to have someone help you when you read instructions, pamphlets, or other written materials from your doctor or pharmacy?: 1 - Never   Interpreter Needed?: No      Activities of Daily Living    01/22/2022   11:37 AM  In your present state of health, do you have  any difficulty performing the following activities:  Hearing? 0  Vision? 0  Difficulty concentrating or making decisions? 0  Walking or climbing stairs? 0  Dressing or bathing? 0  Doing errands, shopping? 0  Preparing Food and eating ? N  Using the Toilet? N  In the past six months, have you accidently leaked urine? Y  Comment Managed with daily pad/liner  Do you have problems with loss  of bowel control? N  Managing your Medications? N  Managing your Finances? N  Housekeeping or managing your Housekeeping? N    Patient Care Team: Burnard Hawthorne, FNP as PCP - General (Family Medicine)  Indicate any recent Medical Services you may have received from other than [facility] providers in the past year (date may be approximate).     Assessment:   This is a routine wellness examination for [place].  I connected with  Marnita Caulfield on 01/22/22 by a audio enabled telemedicine application and verified that I am speaking with the correct person using two identifiers.  Patient Location: Home  Provider Location: Office/Clinic  I discussed the limitations of evaluation and management by telemedicine. The patient expressed understanding and agreed to proceed.   Hearing/Vision screen Hearing Screening - Comments:: Patient is able to hear conversational tones without difficulty.  No issues reported.   Vision Screening - Comments:: Wears corrective lenses when reading They have regular follow up with the ophthalmologist  Dietary issues and exercise activities discussed: Current Exercise Habits: Home exercise routine, Type of exercise: walking, Time (Minutes): 30, Frequency (Times/Week): 2, Weekly Exercise (Minutes/Week): 60, Intensity: Mild Healthy diet Good water intake   Goals Addressed               This Visit's Progress     Patient Stated     Increase physical activity (pt-stated)        I will walk more for exercise.       Depression Screen    01/22/2022   11:37 AM  11/24/2021    9:55 AM 07/23/2021    1:33 PM 05/21/2021    2:52 PM 03/26/2021    3:24 PM 01/21/2021   10:42 AM 12/17/2020   10:04 AM  PHQ 2/9 Scores  PHQ - 2 Score 0     0   Exception Documentation    Patient refusal        Information is confidential and restricted. Go to Review Flowsheets to unlock data.    Fall Risk    01/22/2022   11:37 AM 05/21/2021    2:52 PM 01/21/2021   10:42 AM 01/19/2020   10:53 AM 05/08/2019   10:08 AM  Fall Risk   Falls in the past year? 0 0 0 0 0  Number falls in past yr: 0 0  0   Injury with Fall? 0 0  0   Risk for fall due to :  No Fall Risks     Follow up Falls evaluation completed;Falls prevention discussed Falls evaluation completed Falls evaluation completed Falls evaluation completed Falls evaluation completed    FALL RISK PREVENTION PERTAINING TO THE HOME: Home free of loose throw rugs in walkways, pet beds, electrical cords, etc? Yes  Adequate lighting in your home to reduce risk of falls? Yes   ASSISTIVE DEVICES UTILIZED TO PREVENT FALLS: Life alert? No  Use of a cane, walker or w/c? No  Grab bars in the bathroom? No  Shower chair or bench in shower? No  Elevated toilet seat or a handicapped toilet? No   TIMED UP AND GO: Was the test performed? No .   Cognitive Function:    01/04/2017    2:26 PM  MMSE - Mini Mental State Exam  Not completed: Refused        01/22/2022   12:06 PM 01/21/2021   10:56 AM 01/18/2019   10:53 AM 01/12/2018    2:56 PM  6CIT Screen  What Year? 0  points 0 points 0 points 0 points  What month? 0 points 0 points 0 points 0 points  What time? 0 points 0 points 0 points 0 points  Count back from 20 0 points 0 points 0 points 0 points  Months in reverse 0 points 0 points 0 points 0 points  Repeat phrase 0 points 2 points 0 points 0 points  Total Score 0 points 2 points 0 points 0 points    Immunizations Immunization History  Administered Date(s) Administered   Fluad Quad(high Dose 65+) 12/08/2021    Influenza, High Dose Seasonal PF 12/12/2015, 01/04/2017, 01/12/2018, 12/19/2018, 12/18/2020   Influenza-Unspecified 01/07/2013, 12/08/2019   PFIZER(Purple Top)SARS-COV-2 Vaccination 05/10/2019, 05/31/2019, 12/08/2019, 12/18/2020   Pfizer Covid-19 Vaccine Bivalent Booster 1yrs & up 12/18/2020, 12/08/2021   Pneumococcal Conjugate-13 08/22/2013   Pneumococcal Polysaccharide-23 09/14/2014    TDAP status: Due, Education has been provided regarding the importance of this vaccine. Advised may receive this vaccine at local pharmacy or Health Dept. Aware to provide a copy of the vaccination record if obtained from local pharmacy or Health Dept. Verbalized acceptance and understanding.  Shingrix Completed?: No.    Education has been provided regarding the importance of this vaccine. Patient has been advised to call insurance company to determine out of pocket expense if they have not yet received this vaccine. Advised may also receive vaccine at local pharmacy or Health Dept. Verbalized acceptance and understanding.  Screening Tests Health Maintenance  Topic Date Due   Zoster Vaccines- Shingrix (1 of 2) 04/24/2022 (Originally 08/28/1964)   TETANUS/TDAP  01/23/2023 (Originally 08/28/1964)   COVID-19 Vaccine (6 - Pfizer risk series) 02/02/2022   Medicare Annual Wellness (AWV)  01/23/2023   Pneumonia Vaccine 33+ Years old  Completed   INFLUENZA VACCINE  Completed   DEXA SCAN  Completed   Hepatitis C Screening  Completed   HPV VACCINES  Aged Out   Fecal DNA (Cologuard)  Discontinued   Health Maintenance There are no preventive care reminders to display for this patient.  Lung Cancer Screening: (Low Dose CT Chest recommended if Age 33-80 years, 30 pack-year currently smoking OR have quit w/in 15years.) does not qualify.   Hepatitis C Screening: Completed 2017.  Vision Screening: Recommended annual ophthalmology exams for early detection of glaucoma and other disorders of the eye.  Dental  Screening: Recommended annual dental exams for proper oral hygiene.  Community Resource Referral / Chronic Care Management: CRR required this visit?  No   CCM required this visit?  No      Plan:     I have personally reviewed and noted the following in the patient's chart:   Medical and social history Use of alcohol, tobacco or illicit drugs  Current medications and supplements including opioid prescriptions. Patient is not currently taking opioid prescriptions. Functional ability and status Nutritional status Physical activity Advanced directives List of other physicians Hospitalizations, surgeries, and ER visits in previous 12 months Vitals Screenings to include cognitive, depression, and falls Referrals and appointments  In addition, I have reviewed and discussed with patient certain preventive protocols, quality metrics, and best practice recommendations. A written personalized care plan for preventive services as well as general preventive health recommendations were provided to patient.     Leta Jungling, LPN   [id number]

## 2022-01-22 NOTE — Patient Instructions (Addendum)
Rachel Macias , Thank you for taking time to come for your Medicare Wellness Visit. I appreciate your ongoing commitment to your health goals. Please review the following plan we discussed and let me know if I can assist you in the future.   These are the goals we discussed:  Goals       Patient Stated     Increase physical activity (pt-stated)      I will walk more for exercise.      Other     Healthy Lifestyle      I plan to get a fitbit to assist with BP readings and falls.         This is a list of the screening recommended for you and due dates:  Health Maintenance  Topic Date Due   Zoster (Shingles) Vaccine (1 of 2) 04/24/2022*   Tetanus Vaccine  01/23/2023*   COVID-19 Vaccine (6 - Pfizer risk series) 02/02/2022   Medicare Annual Wellness Visit  01/23/2023   Pneumonia Vaccine  Completed   Flu Shot  Completed   DEXA scan (bone density measurement)  Completed   Hepatitis C Screening: USPSTF Recommendation to screen - Ages 7018-79 yo.  Completed   HPV Vaccine  Aged Out   Cologuard (Stool DNA test)  Discontinued  *Topic was postponed. The date shown is not the original due date.     Preventive Care 7365 Years and Older, Female Preventive care refers to lifestyle choices and visits with your health care provider that can promote health and wellness. What does preventive care include? A yearly physical exam. This is also called an annual well check. Dental exams once or twice a year. Routine eye exams. Ask your health care provider how often you should have your eyes checked. Personal lifestyle choices, including: Daily care of your teeth and gums. Regular physical activity. Eating a healthy diet. Avoiding tobacco and drug use. Limiting alcohol use. Practicing safe sex. Taking low-dose aspirin every day. Taking vitamin and mineral supplements as recommended by your health care provider. What happens during an annual well check? The services and screenings done by your  health care provider during your annual well check will depend on your age, overall health, lifestyle risk factors, and family history of disease. Counseling  Your health care provider may ask you questions about your: Alcohol use. Tobacco use. Drug use. Emotional well-being. Home and relationship well-being. Sexual activity. Eating habits. History of falls. Memory and ability to understand (cognition). Work and work Astronome[employer]vironment. Reproductive health. Screening  You may have the following tests or measurements: Height, weight, and BMI. Blood pressure. Lipid and cholesterol levels. These may be checked every 5 years, or more frequently if you are over 76 years old. Skin check. Lung cancer screening. You may have this screening every year starting at age 76 if you have a 30-pack-year history of smoking and currently smoke or have quit within the past 15 years. Fecal occult blood test (FOBT) of the stool. You may have this test every year starting at age 76. Flexible sigmoidoscopy or colonoscopy. You may have a sigmoidoscopy every 5 years or a colonoscopy every 10 years starting at age 76. Hepatitis C blood test. Hepatitis B blood test. Sexually transmitted disease (STD) testing. Diabetes screening. This is done by checking your blood sugar (glucose) after you have not eaten for a while (fasting). You may have this done every 1-3 years. Bone density scan. This is done to screen for osteoporosis. You may have this  done starting at age 70. Mammogram. This may be done every 1-2 years. Talk to your health care provider about how often you should have regular mammograms. Talk with your health care provider about your test results, treatment options, and if necessary, the need for more tests. Vaccines  Your health care provider may recommend certain vaccines, such as: Influenza vaccine. This is recommended every year. Tetanus, diphtheria, and acellular pertussis (Tdap, Td) vaccine. You may  need a Td booster every 10 years. Zoster vaccine. You may need this after age 31. Pneumococcal 13-valent conjugate (PCV13) vaccine. One dose is recommended after age 25. Pneumococcal polysaccharide (PPSV23) vaccine. One dose is recommended after age 26. Talk to your health care provider about which screenings and vaccines you need and how often you need them. This information is not intended to replace advice given to you by your health care provider. Make sure you discuss any questions you have with your health care provider. Document Released: 03/22/2015 Document Revised: 11/13/2015 Document Reviewed: 12/25/2014 Elsevier Interactive Patient Education  2017 [employer].  Fall Prevention in the Home Falls can cause injuries. They can happen to people of all ages. There are many things you can do to make your home safe and to help prevent falls. What can I do on the outside of my home? Regularly fix the edges of walkways and driveways and fix any cracks. Remove anything that might make you trip as you walk through a door, such as a raised step or threshold. Trim any bushes or trees on the path to your home. Use bright outdoor lighting. Clear any walking paths of anything that might make someone trip, such as rocks or tools. Regularly check to see if handrails are loose or broken. Make sure that both sides of any steps have handrails. Any raised decks and porches should have guardrails on the edges. Have any leaves, snow, or ice cleared regularly. Use sand or salt on walking paths during winter. Clean up any spills in your garage right away. This includes oil or grease spills. What can I do in the bathroom? Use night lights. Install grab bars by the toilet and in the tub and shower. Do not use towel bars as grab bars. Use non-skid mats or decals in the tub or shower. If you need to sit down in the shower, use a plastic, non-slip stool. Keep the floor dry. Clean up any water that spills on  the floor as soon as it happens. Remove soap buildup in the tub or shower regularly. Attach bath mats securely with double-sided non-slip rug tape. Do not have throw rugs and other things on the floor that can make you trip. What can I do in the bedroom? Use night lights. Make sure that you have a light by your bed that is easy to reach. Do not use any sheets or blankets that are too big for your bed. They should not hang down onto the floor. Have a firm chair that has side arms. You can use this for support while you get dressed. Do not have throw rugs and other things on the floor that can make you trip. What can I do in the kitchen? Clean up any spills right away. Avoid walking on wet floors. Keep items that you use a lot in easy-to-reach places. If you need to reach something above you, use a strong step stool that has a grab bar. Keep electrical cords out of the way. Do not use floor polish or wax  that makes floors slippery. If you must use wax, use non-skid floor wax. Do not have throw rugs and other things on the floor that can make you trip. What can I do with my stairs? Do not leave any items on the stairs. Make sure that there are handrails on both sides of the stairs and use them. Fix handrails that are broken or loose. Make sure that handrails are as long as the stairways. Check any carpeting to make sure that it is firmly attached to the stairs. Fix any carpet that is loose or worn. Avoid having throw rugs at the top or bottom of the stairs. If you do have throw rugs, attach them to the floor with carpet tape. Make sure that you have a light switch at the top of the stairs and the bottom of the stairs. If you do not have them, ask someone to add them for you. What else can I do to help prevent falls? Wear shoes that: Do not have high heels. Have rubber bottoms. Are comfortable and fit you well. Are closed at the toe. Do not wear sandals. If you use a stepladder: Make sure  that it is fully opened. Do not climb a closed stepladder. Make sure that both sides of the stepladder are locked into place. Ask someone to hold it for you, if possible. Clearly mark and make sure that you can see: Any grab bars or handrails. First and last steps. Where the edge of each step is. Use tools that help you move around (mobility aids) if they are needed. These include: Canes. Walkers. Scooters. Crutches. Turn on the lights when you go into a dark area. Replace any light bulbs as soon as they burn out. Set up your furniture so you have a clear path. Avoid moving your furniture around. If any of your floors are uneven, fix them. If there are any pets around you, be aware of where they are. Review your medicines with your doctor. Some medicines can make you feel dizzy. This can increase your chance of falling. Ask your doctor what other things that you can do to help prevent falls. This information is not intended to replace advice given to you by your health care provider. Make sure you discuss any questions you have with your health care provider. Document Released: 12/20/2008 Document Revised: 08/01/2015 Document Reviewed: 03/30/2014 Elsevier Interactive Patient Education  2017 [employer].

## 2022-02-02 ENCOUNTER — Other Ambulatory Visit: Payer: Self-pay

## 2022-02-02 ENCOUNTER — Encounter: Payer: Self-pay | Admitting: Family

## 2022-02-02 DIAGNOSIS — Z1211 Encounter for screening for malignant neoplasm of colon: Secondary | ICD-10-CM

## 2022-02-05 DIAGNOSIS — Z1211 Encounter for screening for malignant neoplasm of colon: Secondary | ICD-10-CM | POA: Diagnosis not present

## 2022-02-15 ENCOUNTER — Encounter: Payer: Self-pay | Admitting: Family

## 2022-02-15 LAB — COLOGUARD: COLOGUARD: NEGATIVE

## 2022-02-16 ENCOUNTER — Encounter: Payer: Self-pay | Admitting: Family

## 2022-02-25 ENCOUNTER — Ambulatory Visit
Admission: RE | Admit: 2022-02-25 | Discharge: 2022-02-25 | Disposition: A | Discharge status: Home / Self Care | Payer: Medicare Other | Source: Ambulatory Visit | Attending: Family | Admitting: Family

## 2022-02-25 DIAGNOSIS — Z1231 Encounter for screening mammogram for malignant neoplasm of breast: Secondary | ICD-10-CM | POA: Diagnosis not present

## 2022-05-25 ENCOUNTER — Encounter: Payer: Medicare Other | Admitting: Family

## 2022-05-27 ENCOUNTER — Ambulatory Visit: Payer: Medicare Other | Admitting: Psychiatry

## 2022-06-02 ENCOUNTER — Ambulatory Visit (INDEPENDENT_AMBULATORY_CARE_PROVIDER_SITE_OTHER): Payer: Medicare Other | Admitting: Family

## 2022-06-02 ENCOUNTER — Encounter: Payer: Self-pay | Admitting: Family

## 2022-06-02 VITALS — BP 126/82 | HR 78 | Temp 97.9°F | Ht 64.0 in | Wt 160.8 lb

## 2022-06-02 DIAGNOSIS — M810 Age-related osteoporosis without current pathological fracture: Secondary | ICD-10-CM | POA: Diagnosis not present

## 2022-06-02 DIAGNOSIS — E785 Hyperlipidemia, unspecified: Secondary | ICD-10-CM

## 2022-06-02 DIAGNOSIS — Z0000 Encounter for general adult medical examination without abnormal findings: Secondary | ICD-10-CM

## 2022-06-02 DIAGNOSIS — F3174 Bipolar disorder, in full remission, most recent episode manic: Secondary | ICD-10-CM | POA: Diagnosis not present

## 2022-06-02 DIAGNOSIS — Z8639 Personal history of other endocrine, nutritional and metabolic disease: Secondary | ICD-10-CM

## 2022-06-02 LAB — COMPREHENSIVE METABOLIC PANEL
ALT: 17 U/L (ref 0–35)
AST: 25 U/L (ref 0–37)
Albumin: 4.4 g/dL (ref 3.5–5.2)
Alkaline Phosphatase: 56 U/L (ref 39–117)
BUN: 17 mg/dL (ref 6–23)
CO2: 30 mEq/L (ref 19–32)
Calcium: 9.9 mg/dL (ref 8.4–10.5)
Chloride: 102 mEq/L (ref 96–112)
Creatinine, Ser: 1.07 mg/dL (ref 0.40–1.20)
GFR: 50.37 mL/min — ABNORMAL LOW (ref >60.00)
Glucose, Bld: 102 mg/dL — ABNORMAL HIGH (ref 70–99)
Potassium: 4.4 mEq/L (ref 3.5–5.1)
Sodium: 140 mEq/L (ref 135–145)
Total Bilirubin: 0.4 mg/dL (ref 0.2–1.2)
Total Protein: 7.5 g/dL (ref 6.0–8.3)

## 2022-06-02 LAB — LIPID PANEL
Cholesterol: 187 mg/dL (ref 0–200)
HDL: 55.3 mg/dL (ref >39.00)
NonHDL: 131.86
Total CHOL/HDL Ratio: 3
Triglycerides: 206 mg/dL — ABNORMAL HIGH (ref 0.0–149.0)
VLDL: 41.2 mg/dL — ABNORMAL HIGH (ref 0.0–40.0)

## 2022-06-02 LAB — CBC WITH DIFFERENTIAL/PLATELET
Basophils Absolute: 0 10*3/uL (ref 0.0–0.1)
Basophils Relative: 0.6 % (ref 0.0–3.0)
Eosinophils Absolute: 0.1 10*3/uL (ref 0.0–0.7)
Eosinophils Relative: 1.4 % (ref 0.0–5.0)
HCT: 40.9 % (ref 36.0–46.0)
Hemoglobin: 13.8 g/dL (ref 12.0–15.0)
Lymphocytes Relative: 37.5 % (ref 12.0–46.0)
Lymphs Abs: 1.9 10*3/uL (ref 0.7–4.0)
MCHC: 33.7 g/dL (ref 30.0–36.0)
MCV: 99.3 fl (ref 78.0–100.0)
Monocytes Absolute: 0.6 10*3/uL (ref 0.1–1.0)
Monocytes Relative: 10.9 % (ref 3.0–12.0)
Neutro Abs: 2.5 10*3/uL (ref 1.4–7.7)
Neutrophils Relative %: 49.6 % (ref 43.0–77.0)
Platelets: 243 10*3/uL (ref 150.0–400.0)
RBC: 4.12 Mil/uL (ref 3.87–5.11)
RDW: 12.8 % (ref 11.5–15.5)
WBC: 5.1 10*3/uL (ref 4.0–10.5)

## 2022-06-02 LAB — TSH: TSH: 1.8 u[IU]/mL (ref 0.35–5.50)

## 2022-06-02 LAB — LDL CHOLESTEROL, DIRECT: Direct LDL: 95 mg/dL

## 2022-06-02 LAB — VITAMIN D 25 HYDROXY (VIT D DEFICIENCY, FRACTURES): VITD: 33.68 ng/mL (ref 30.00–100.00)

## 2022-06-02 NOTE — Assessment & Plan Note (Signed)
Patient politely declines clinical breast exam today.  Patient is no longer screening for cervical cancer.  She declines updating bone density as she would not opt to treat.  Encouraged walking in particular is a good weightbearing exercise.  Advised her her Tdap is due and that she may obtain at local pharmacy.

## 2022-06-02 NOTE — Patient Instructions (Signed)
You need tetanus vaccine, Tdap, at local pharmacy  Health Maintenance for Postmenopausal Women Menopause is a normal process in which your ability to get pregnant comes to an end. This process happens slowly over many months or years, usually between the ages of 52 and 14. Menopause is complete when you have missed your menstrual period for 12 months. It is important to talk with your health care provider about some of the most common conditions that affect women after menopause (postmenopausal women). These include heart disease, cancer, and bone loss (osteoporosis). Adopting a healthy lifestyle and getting preventive care can help to promote your health and wellness. The actions you take can also lower your chances of developing some of these common conditions. What are the signs and symptoms of menopause? During menopause, you may have the following symptoms: Hot flashes. These can be moderate or severe. Night sweats. Decrease in sex drive. Mood swings. Headaches. Tiredness (fatigue). Irritability. Memory problems. Problems falling asleep or staying asleep. Talk with your health care provider about treatment options for your symptoms. Do I need hormone replacement therapy? Hormone replacement therapy is effective in treating symptoms that are caused by menopause, such as hot flashes and night sweats. Hormone replacement carries certain risks, especially as you become older. If you are thinking about using estrogen or estrogen with progestin, discuss the benefits and risks with your health care provider. How can I reduce my risk for heart disease and stroke? The risk of heart disease, heart attack, and stroke increases as you age. One of the causes may be a change in the body's hormones during menopause. This can affect how your body uses dietary fats, triglycerides, and cholesterol. Heart attack and stroke are medical emergencies. There are many things that you can do to help prevent heart  disease and stroke. Watch your blood pressure High blood pressure causes heart disease and increases the risk of stroke. This is more likely to develop in people who have high blood pressure readings or are overweight. Have your blood pressure checked: Every 3-5 years if you are 82-41 years of age. Every year if you are 52 years old or older. Eat a healthy diet  Eat a diet that includes plenty of vegetables, fruits, low-fat dairy products, and lean protein. Do not eat a lot of foods that are high in solid fats, added sugars, or sodium. Get regular exercise Get regular exercise. This is one of the most important things you can do for your health. Most adults should: Try to exercise for at least 150 minutes each week. The exercise should increase your heart rate and make you sweat (moderate-intensity exercise). Try to do strengthening exercises at least twice each week. Do these in addition to the moderate-intensity exercise. Spend less time sitting. Even light physical activity can be beneficial. Other tips Work with your health care provider to achieve or maintain a healthy weight. Do not use any products that contain nicotine or tobacco. These products include cigarettes, chewing tobacco, and vaping devices, such as e-cigarettes. If you need help quitting, ask your health care provider. Know your numbers. Ask your health care provider to check your cholesterol and your blood sugar (glucose). Continue to have your blood tested as directed by your health care provider. Do I need screening for cancer? Depending on your health history and family history, you may need to have cancer screenings at different stages of your life. This may include screening for: Breast cancer. Cervical cancer. Lung cancer. Colorectal cancer. What is  my risk for osteoporosis? After menopause, you may be at increased risk for osteoporosis. Osteoporosis is a condition in which bone destruction happens more quickly  than new bone creation. To help prevent osteoporosis or the bone fractures that can happen because of osteoporosis, you may take the following actions: If you are 41-21 years old, get at least 1,000 mg of calcium and at least 600 international units (IU) of vitamin D per day. If you are older than age 33 but younger than age 110, get at least 1,200 mg of calcium and at least 600 international units (IU) of vitamin D per day. If you are older than age 45, get at least 1,200 mg of calcium and at least 800 international units (IU) of vitamin D per day. Smoking and drinking excessive alcohol increase the risk of osteoporosis. Eat foods that are rich in calcium and vitamin D, and do weight-bearing exercises several times each week as directed by your health care provider. How does menopause affect my mental health? Depression may occur at any age, but it is more common as you become older. Common symptoms of depression include: Feeling depressed. Changes in sleep patterns. Changes in appetite or eating patterns. Feeling an overall lack of motivation or enjoyment of activities that you previously enjoyed. Frequent crying spells. Talk with your health care provider if you think that you are experiencing any of these symptoms. General instructions See your health care provider for regular wellness exams and vaccines. This may include: Scheduling regular health, dental, and eye exams. Getting and maintaining your vaccines. These include: Influenza vaccine. Get this vaccine each year before the flu season begins. Pneumonia vaccine. Shingles vaccine. Tetanus, diphtheria, and pertussis (Tdap) booster vaccine. Your health care provider may also recommend other immunizations. Tell your health care provider if you have ever been abused or do not feel safe at home. Summary Menopause is a normal process in which your ability to get pregnant comes to an end. This condition causes hot flashes, night sweats,  decreased interest in sex, mood swings, headaches, or lack of sleep. Treatment for this condition may include hormone replacement therapy. Take actions to keep yourself healthy, including exercising regularly, eating a healthy diet, watching your weight, and checking your blood pressure and blood sugar levels. Get screened for cancer and depression. Make sure that you are up to date with all your vaccines. This information is not intended to replace advice given to you by your health care provider. Make sure you discuss any questions you have with your health care provider. Document Revised: 07/15/2020 Document Reviewed: 07/15/2020 Elsevier Patient Education  [place].

## 2022-06-02 NOTE — Progress Notes (Signed)
Assessment & Plan:  Dyslipidemia Assessment & Plan: Chronic, stable.  Pending lipid panel.  Continue simvastatin 20 mg  Orders: -     Lipid panel  Bipolar disorder, in full remission, most recent episode manic (HCC) -     Comprehensive metabolic panel -     TSH -     CBC with Differential/Platelet  History of vitamin D deficiency -     VITAMIN D 25 Hydroxy (Vit-D Deficiency, Fractures)  Age-related osteoporosis without current pathological fracture -     VITAMIN D 25 Hydroxy (Vit-D Deficiency, Fractures)  Routine physical examination Assessment & Plan: Patient politely declines clinical breast exam today.  Patient is no longer screening for cervical cancer.  She declines updating bone density as she would not opt to treat.  Encouraged walking in particular is a good weightbearing exercise.  Advised her her Tdap is due and that she may obtain at local pharmacy.      Return precautions given.   Risks, benefits, and alternatives of the medications and treatment plan prescribed today were discussed, and patient expressed understanding.   Education regarding symptom management and diagnosis given to patient on AVS either electronically or printed.  Return for Complete Physical Exam.  Mable Paris, FNP  Subjective:    Patient ID: Rachel Macias, female    DOB: 1945/07/18, 77 y.o.   MRN: [id number]  CC: Rachel Macias is a 77 y.o. female who presents today for physical exam and follow-up.    HPI: Feels well today.  No new complaints.  She continues to care for her 9 year old dog.  She enjoys gardening.  Denies chest pain.  no new skin lesions    Colorectal Cancer Screening: Cologuard negative 02/05/2022  Breast Cancer Screening: Mammogram UTD Cervical Cancer Screening: She is no longer screening for cervical cancer Bone Health screening/DEXA for 65+: due; declines  Lung Cancer Screening: Doesn't have 20 year pack year history and age > 56 years yo 77 years         Tetanus - due  Exercise: Regular exercise with gardening.   Alcohol use:  none Smoking/tobacco use: former smoker; quit > 15 yrs ago.    Health Maintenance  Topic Date Due   DTaP/Tdap/Td (1 - Tdap) Never done   COVID-19 Vaccine (6 - 2023-24 season) 02/02/2022   Zoster Vaccines- Shingrix (1 of 2) 09/01/2022 (Originally 08/28/1964)   Medicare Annual Wellness (AWV)  01/23/2023   Pneumonia Vaccine 20+ Years old  Completed   INFLUENZA VACCINE  Completed   DEXA SCAN  Completed   Hepatitis C Screening  Completed   HPV VACCINES  Aged Out   Fecal DNA (Cologuard)  Discontinued    ALLERGIES: Alendronate sodium and Amoxicillin  Current Outpatient Medications on File Prior to Visit  Medication Sig Dispense Refill   cholecalciferol (VITAMIN D) 1000 UNITS tablet Take 2 tablets (2,000 Units total) by mouth daily.     divalproex (DEPAKOTE ER) 500 MG 24 hr tablet TAKE 2 TABLETS BY MOUTH AT  BEDTIME 180 tablet 3   QUEtiapine (SEROQUEL) 200 MG tablet TAKE 1 TABLET BY MOUTH AT  BEDTIME 90 tablet 3   simvastatin (ZOCOR) 20 MG tablet TAKE 1 TABLET BY MOUTH IN  THE EVENING 90 tablet 3   No current facility-administered medications on file prior to visit.    Review of Systems  Constitutional:  Negative for chills, fever and unexpected weight change.  HENT:  Negative for congestion.   Respiratory:  Negative for cough.   Cardiovascular:  Negative for chest pain, palpitations and leg swelling.  Gastrointestinal:  Negative for nausea and vomiting.  Musculoskeletal:  Negative for arthralgias and myalgias.  Skin:  Negative for rash.  Neurological:  Negative for headaches.  Hematological:  Negative for adenopathy.  Psychiatric/Behavioral:  Negative for confusion.       Objective:    BP 126/82   Pulse 78   Temp 97.9 F (36.6 C) (Oral)   Ht 5\' 4"  (1.626 m)   Wt 160 lb 12.8 oz (72.9 kg)   SpO2 97%   BMI 27.60 kg/m   BP Readings from Last 3 Encounters:  06/02/22 126/82  05/21/21 130/60   05/08/20 116/78   Wt Readings from Last 3 Encounters:  06/02/22 160 lb 12.8 oz (72.9 kg)  01/22/22 158 lb (71.7 kg)  05/21/21 154 lb 6.4 oz (70 kg)    Physical Exam Vitals reviewed.  Constitutional:      Appearance: She is well-developed.  Eyes:     Conjunctiva/sclera: Conjunctivae normal.  Neck:     Thyroid: No thyroid mass or thyromegaly.  Cardiovascular:     Rate and Rhythm: Normal rate and regular rhythm.     Pulses: Normal pulses.     Heart sounds: Normal heart sounds.  Pulmonary:     Effort: Pulmonary effort is normal.     Breath sounds: Normal breath sounds. No wheezing, rhonchi or rales.  Lymphadenopathy:     Head:     Right side of head: No submental, submandibular, tonsillar, preauricular, posterior auricular or occipital adenopathy.     Left side of head: No submental, submandibular, tonsillar, preauricular, posterior auricular or occipital adenopathy.     Cervical: No cervical adenopathy.  Skin:    General: Skin is warm and dry.  Neurological:     Mental Status: She is alert.  Psychiatric:        Speech: Speech normal.        Behavior: Behavior normal.        Thought Content: Thought content normal.

## 2022-06-02 NOTE — Assessment & Plan Note (Signed)
Chronic, stable.  Pending lipid panel.  Continue simvastatin 20 mg 

## 2022-06-08 ENCOUNTER — Telehealth: Payer: Self-pay

## 2022-06-08 DIAGNOSIS — F3110 Bipolar disorder, current episode manic without psychotic features, unspecified: Secondary | ICD-10-CM

## 2022-06-08 NOTE — Telephone Encounter (Signed)
pt called left message that she needed a refill on the quetiapine 200 mg    Disp Refills Start End   QUEtiapine (SEROQUEL) 200 MG tablet 90 tablet 3 05/26/2021    Sig: TAKE 1 TABLET BY MOUTH AT  BEDTIME   Sent to pharmacy as: QUEtiapine (SEROQUEL) 200 MG tablet   Notes to Pharmacy: Requesting 1 year supply   E-Prescribing Status: Receipt confirmed by pharmacy (05/26/2021  8:26 AM EDT)

## 2022-06-09 MED ORDER — QUETIAPINE FUMARATE 200 MG PO TABS: 200.0000 mg | ORAL_TABLET | Freq: Every day | ORAL | 3 refills | Status: DC

## 2022-06-09 NOTE — Telephone Encounter (Signed)
I have sent Seroquel 200 mg to pharmacy.

## 2022-06-10 NOTE — Telephone Encounter (Signed)
pt was left a message that rx had been sent to pharmacy

## 2022-06-18 ENCOUNTER — Encounter: Payer: Self-pay | Admitting: Psychiatry

## 2022-06-18 ENCOUNTER — Ambulatory Visit (INDEPENDENT_AMBULATORY_CARE_PROVIDER_SITE_OTHER): Payer: Medicare Other | Admitting: Psychiatry

## 2022-06-18 VITALS — BP 149/83 | HR 96 | Temp 97.2°F | Ht 64.0 in | Wt 161.4 lb

## 2022-06-18 DIAGNOSIS — F3174 Bipolar disorder, in full remission, most recent episode manic: Secondary | ICD-10-CM

## 2022-06-18 NOTE — Progress Notes (Signed)
BH MD OP Progress Note  06/18/2022 2:10 PM Rachel PetersJane Arenas  MRN:  [id number]  Chief Complaint:  Chief Complaint  Patient presents with   Follow-up   Anxiety   Manic Behavior   Depression   Medication Refill   HPI: Rachel Macias is a 77 year old Caucasian female, widowed, lives in [place], has a history of bipolar disorder, insomnia was evaluated in office today.  Patient today appeared to be pleasant, cooperative.  Reports she is compliant on all her medications including Depakote, Seroquel.  Denies side effects.  Patient reports sleep and appetite is fair.  Patient reports she has been practicing meditation, mindfulness.  She has been interacting with her social support system.  Reports overall she feels good.  Patient denies any suicidality, homicidality or perceptual disturbances.  Currently denies any other concerns.  Visit Diagnosis:    ICD-10-CM   1. Bipolar disorder, in full remission, most recent episode manic  F31.74       Past Psychiatric History: I have reviewed past psychiatric history from progress note on 06/10/2017.  Past trials of Prozac, lithium, Zyprexa  Past Medical History:  Past Medical History:  Diagnosis Date   Bipolar 1 disorder [id number]   Dr. Caryn SectionAarti Kapur [phone])   Ex-smoker 2001   minimal   Osteoporosis 2015   fosamax reaction, then was on boniva IV for ~1 yr   Postmenopausal    age 77   Vitamin D deficiency     Past Surgical History:  Procedure Laterality Date   CESAREAN SECTION     DEXA  09/2013   T -2.8 femur   TUBAL LIGATION     with reversal    Family Psychiatric History: Reviewed family psychiatric history from progress note on 06/10/2017.  Family History:  Family History  Problem Relation Age of Onset   Bipolar disorder Mother    Depression Mother    Anxiety disorder Mother    Diabetes Brother    Decreased libido Brother    Thyroid disease Other        runs in family   Stroke Neg Hx    CAD Neg Hx    Cancer Neg Hx      Social History: Reviewed social history from progress note on 06/10/2017. Social History   Socioeconomic History   Marital status: Widowed    Spouse name: Not on file   Number of children: 1   Years of education: Not on file   Highest education level: Some college, no degree  Occupational History    Comment: retired  Tobacco Use   Smoking status: Former    Types: Cigarettes    Quit date: 03/10/1999    Years since quitting: 23.2   Smokeless tobacco: Never  Vaping Use   Vaping Use: Never used  Substance and Sexual Activity   Alcohol use: No   Drug use: No   Sexual activity: Not Currently  Other Topics Concern   Not on file  Social History Narrative   Lives alone with her dog (reported Vanessa Kickjan 18.2023), widow; husband died from brain cancer around 2011.    Had lived in [place] Moved to [place] to be closer to family.    son died at age 77yo (hit by car).   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)   Edu: 13 yrs   Activity: no regular exercise   Diet: good water, fruits/vegetables some   Social Determinants  of Health   Financial Resource Strain: Low Risk  (01/22/2022)   Overall Financial Resource Strain (CARDIA)    Difficulty of Paying Living Expenses: Not hard at all  Food Insecurity: No Food Insecurity (05/29/2022)   Hunger Vital Sign    Worried About Running Out of Food in the Last Year: Never true    Ran Out of Food in the Last Year: Never true  Transportation Needs: No Transportation Needs (05/29/2022)   PRAPARE - Administrator, Civil Service (Medical): No    Lack of Transportation (Non-Medical): No  Physical Activity: Insufficiently Active (05/29/2022)   Exercise Vital Sign    Days of Exercise per Week: 2 days    Minutes of Exercise per Session: 20 min  Stress: No Stress Concern Present (05/29/2022)   [employer] of Occupational Health - Occupational Stress Questionnaire     Feeling of Stress : Not at all  Social Connections: Socially Isolated (05/29/2022)   Social Connection and Isolation Panel [NHANES]    Frequency of Communication with Friends and Family: More than three times a week    Frequency of Social Gatherings with Friends and Family: Three times a week    Attends Religious Services: Never    Active Member of Clubs or Organizations: No    Attends Banker Meetings: Never    Marital Status: Widowed    Allergies:  Allergies  Allergen Reactions   Alendronate Sodium Other (See Comments)    GI distress GI distress GI distress   Amoxicillin Hives    Metabolic Disorder Labs: Lab Results  Component Value Date   HGBA1C 5.0 01/13/2018   Lab Results  Component Value Date   PROLACTIN 7.7 12/17/2020   PROLACTIN 11.7 01/13/2018   Lab Results  Component Value Date   CHOL 187 06/02/2022   TRIG 206.0 (H) 06/02/2022   HDL 55.30 06/02/2022   CHOLHDL 3 06/02/2022   VLDL 41.2 (H) 06/02/2022   LDLCALC 82 05/21/2021   LDLCALC 97 05/08/2020   Lab Results  Component Value Date   TSH 1.80 06/02/2022   TSH 1.76 05/21/2021    Therapeutic Level Labs: Lab Results  Component Value Date   LITHIUM 1.40 (H) 08/15/2016   LITHIUM 2.10 (HH) 08/15/2016   Lab Results  Component Value Date   VALPROATE 76 11/24/2021   VALPROATE 63 12/17/2020   No results found for: "CBMZ"  Current Medications: Current Outpatient Medications  Medication Sig Dispense Refill   cholecalciferol (VITAMIN D) 1000 UNITS tablet Take 2 tablets (2,000 Units total) by mouth daily.     divalproex (DEPAKOTE ER) 500 MG 24 hr tablet TAKE 2 TABLETS BY MOUTH AT  BEDTIME 180 tablet 3   QUEtiapine (SEROQUEL) 200 MG tablet Take 1 tablet (200 mg total) by mouth at bedtime. 90 tablet 3   simvastatin (ZOCOR) 20 MG tablet TAKE 1 TABLET BY MOUTH IN  THE EVENING 90 tablet 3   No current facility-administered medications for this visit.     Musculoskeletal: Strength & Muscle  Tone: within normal limits Gait & Station: normal Patient leans: N/A  Psychiatric Specialty Exam: Review of Systems  Psychiatric/Behavioral: Negative.    All other systems reviewed and are negative.   Blood pressure (!) 149/83, pulse 96, temperature (!) 97.2 F (36.2 C), temperature source Skin, height 5\' 4"  (1.626 m), weight 161 lb 6.4 oz (73.2 kg).Body mass index is 27.7 kg/m.  General Appearance: Casual  Eye Contact:  Fair  Speech:  Normal Rate  Volume:  Normal  Mood:  Euthymic  Affect:  Congruent  Thought Process:  Goal Directed and Descriptions of Associations: Intact  Orientation:  Full (Time, Place, and Person)  Thought Content: Logical   Suicidal Thoughts:  No  Homicidal Thoughts:  No  Memory:  Immediate;   Fair Recent;   Fair Remote;   Fair  Judgement:  Fair  Insight:  Fair  Psychomotor Activity:  Normal  Concentration:  Concentration: Fair and Attention Span: Fair  Recall:  [employer] of Knowledge: Fair  Language: Fair  Akathisia:  No  Handed:  Right  AIMS (if indicated): done  Assets:  Communication Skills Desire for Improvement Housing Social Support Talents/Skills Transportation  ADL's:  Intact  Cognition: WNL  Sleep:  Fair   Screenings: Geneticist, molecular Office Visit from 06/18/2022 in [facility] Office Visit from 11/24/2021 in [facility] Office Visit from 07/23/2021 in [facility] Office Visit from 12/17/2020 in [facility]  AIMS Total Score 0 0 0 0      GAD-7    Flowsheet Row Office Visit from 06/18/2022 in [facility] Office Visit from 06/02/2022 in [facility] [employer] Visit from 11/24/2021 in [facility] Office Visit from 05/21/2021 in [facility] [employer]  Total GAD-7 Score 0 0 0 0      PHQ2-9    Flowsheet Row Office Visit from 06/18/2022 in [facility] Office Visit from 06/02/2022 in [facility] Clinical Support from 01/22/2022 in [facility] [employer] Visit from 11/24/2021 in [facility] Office Visit from 07/23/2021 in [facility]  PHQ-2 Total Score 0 0 0 0 0  PHQ-9 Total Score 0 0 -- -- --      Flowsheet Row Office Visit from 06/18/2022 in [facility] Office Visit from 11/24/2021 in [facility] Video Visit from 09/16/2020 in [facility]  C-SSRS RISK CATEGORY No Risk No Risk No Risk        Assessment and Plan: Azzaria Olear is a 77 year old Caucasian female who has a history of bipolar disorder, history of lithium toxicity was evaluated in the office today.  Patient is currently stable.  Plan Bipolar disorder in remission Depakote ER 1000 mg p.o. nightly Seroquel 100 mg p.o. nightly Depakote level-11/24/2021-76-therapeutic. Hepatic function panel-11/24/2021-within normal limits.   Patient with elevated blood pressure reading in session today, patient reports her blood pressure is normally low.  Reviewed notes per primary care provider dated 06/02/2022-Margaret Arnett, FNP-blood pressure 126/82 that day.  This was just a few days ago.  Patient advised to monitor blood pressure and reach out to primary care provider as needed.   Follow-up in clinic in 5 to 6 months or sooner if needed.  Collaboration of Care: Collaboration of Care: Other I have reviewed notes per Ms. Rennie Plowman primary care provider dated 06/02/2022.  Patient/Guardian was advised Release of Information must be obtained  prior to any record release in order to collaborate their care with an outside provider. Patient/Guardian was advised if they have not already done so to contact the registration department to sign all necessary forms in order for [place] to release  information regarding their care.   Consent: Patient/Guardian gives verbal consent for treatment and assignment of benefits for services provided during this visit. Patient/Guardian expressed understanding and agreed to proceed.   This note was generated in part or whole with voice recognition software. Voice recognition is usually quite accurate but there are transcription errors that can and very often do occur. I apologize for any typographical errors that were not detected and corrected.    Jomarie Longs, MD 06/19/2022, 9:22 AM

## 2022-07-29 ENCOUNTER — Other Ambulatory Visit: Payer: Self-pay | Admitting: Family

## 2022-07-29 DIAGNOSIS — E785 Hyperlipidemia, unspecified: Secondary | ICD-10-CM

## 2022-10-25 ENCOUNTER — Other Ambulatory Visit: Payer: Self-pay | Admitting: Psychiatry

## 2022-10-25 DIAGNOSIS — F5105 Insomnia due to other mental disorder: Secondary | ICD-10-CM

## 2022-10-26 ENCOUNTER — Telehealth: Payer: Self-pay

## 2022-10-26 DIAGNOSIS — F3174 Bipolar disorder, in full remission, most recent episode manic: Secondary | ICD-10-CM

## 2022-10-26 NOTE — Telephone Encounter (Signed)
[facility] states [facility] the [facility] for the depako but she only has 3 days left so she needs a 7-10 supply sent to [facility] [place]. [facility] is a mail order and it usually takes 7-10 to receive medications.

## 2022-10-27 MED ORDER — DIVALPROEX SODIUM ER 500 MG PO TB24: 1000.0000 mg | ORAL_TABLET | Freq: Every day | ORAL | 0 refills | Status: DC

## 2022-10-27 NOTE — Telephone Encounter (Signed)
I have sent 10 days supply of Depakote to [facility] pharmacy in [place].

## 2022-10-28 NOTE — Telephone Encounter (Signed)
Pt.notified

## 2022-11-04 ENCOUNTER — Other Ambulatory Visit: Payer: Self-pay | Admitting: Psychiatry

## 2022-11-04 DIAGNOSIS — F3174 Bipolar disorder, in full remission, most recent episode manic: Secondary | ICD-10-CM

## 2022-11-19 ENCOUNTER — Ambulatory Visit (INDEPENDENT_AMBULATORY_CARE_PROVIDER_SITE_OTHER): Payer: Medicare Other | Admitting: Psychiatry

## 2022-11-19 ENCOUNTER — Encounter: Payer: Self-pay | Admitting: Psychiatry

## 2022-11-19 ENCOUNTER — Other Ambulatory Visit
Admission: RE | Admit: 2022-11-19 | Discharge: 2022-11-19 | Disposition: A | Discharge status: Home / Self Care | Payer: Medicare Other | Attending: Psychiatry | Admitting: Psychiatry

## 2022-11-19 VITALS — BP 138/85 | HR 94 | Temp 96.1°F | Ht 64.0 in | Wt 159.6 lb

## 2022-11-19 DIAGNOSIS — Z79899 Other long term (current) drug therapy: Secondary | ICD-10-CM | POA: Diagnosis not present

## 2022-11-19 DIAGNOSIS — F3174 Bipolar disorder, in full remission, most recent episode manic: Secondary | ICD-10-CM | POA: Diagnosis not present

## 2022-11-19 LAB — VALPROIC ACID LEVEL: Valproic Acid Lvl: 87 ug/mL (ref 50.0–100.0)

## 2022-11-19 NOTE — Progress Notes (Signed)
BH MD OP Progress Note  11/19/2022 3:01 PM Rachel Macias  MRN:  [id number]  Chief Complaint:  Chief Complaint  Patient presents with   Follow-up   Anxiety   Depression   Medication Refill   HPI: Rachel Macias is a 77 year old Caucasian female, widowed, lives in [place], has a history of bipolar disorder, insomnia was evaluated in office today.  Patient today appeared to be alert, oriented to person place time and situation.  3 word memory immediate 3 out of 3, after 5 minutes 3 out of 3.  Patient was able to spell the word 'WORLD' forward and backward.  Attention and focus seems to be good.  Patient reports she is currently anxious about the fact that her dog who is 38 years old has renal problems.  She has to take the dog to the vet 3 days a week for treatment.  She reports that does get her out of the house and she is able to socialize with other animal lovers.  She reports her dog is also happy since everybody at the vet likes her.  She however does report she worries about her dog since she has had this dog for a very long time and it is hard for her to be without her.  She however reports she is hopeful that she will be able to cope with it as best as she can.  Patient reports she is currently compliant on the Depakote, Seroquel.  Denies side effects.  She reports appetite is good.  Sleep is good.  Patient reports she continues to have good support system.  She spends time with her sister.  Denies any suicidality, homicidality or perceptual disturbances.  Denies any other concerns today.  Visit Diagnosis:    ICD-10-CM   1. Bipolar disorder, in full remission, most recent episode manic (HCC)  F31.74 Valproic acid level    2. High risk medication use  Z79.899 Valproic acid level      Past Psychiatric History: I have reviewed past psychiatric history from progress note on 06/10/2017.  Past trials of Prozac, lithium, Zyprexa  Past Medical History:  Past Medical History:  Diagnosis  Date   Bipolar 1 disorder (HCC) 1995   Dr. Caryn Section [id number]   Ex-smoker 2001   minimal   Osteoporosis 2015   fosamax reaction, then was on boniva IV for ~1 yr   Postmenopausal    age 78   Vitamin D deficiency     Past Surgical History:  Procedure Laterality Date   CESAREAN SECTION     DEXA  09/2013   T -2.8 femur   TUBAL LIGATION     with reversal    Family Psychiatric History: I have reviewed family psychiatric history from progress note on 06/10/2017.  Family History:  Family History  Problem Relation Age of Onset   Bipolar disorder Mother    Depression Mother    Anxiety disorder Mother    Diabetes Brother    Decreased libido Brother    Thyroid disease Other        runs in family   Stroke Neg Hx    CAD Neg Hx    Cancer Neg Hx     Social History: I have reviewed social history from progress note on 06/10/2017. Social History   Socioeconomic History   Marital status: Widowed    Spouse name: Not on file   Number of children: 1   Years of education: Not on file   Highest education level:  Some college, no degree  Occupational History    Comment: retired  Tobacco Use   Smoking status: Former    Current packs/day: 0.00    Types: Cigarettes    Quit date: 03/10/1999    Years since quitting: 23.7   Smokeless tobacco: Never  Vaping Use   Vaping status: Never Used  Substance and Sexual Activity   Alcohol use: No   Drug use: No   Sexual activity: Not Currently  Other Topics Concern   Not on file  Social History Narrative   Lives alone with her dog (reported Rachel Macias 11-30-22), widow; husband died from brain cancer around 11/26/09.    Had lived in [place], [place]. Moved to [place] to be closer to family.    son died at age 42yo (hit by car).   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)   Edu: 13 yrs   Activity: no regular exercise   Diet: good water, fruits/vegetables some   Social Determinants  of Health   Financial Resource Strain: Low Risk  (01/22/2022)   Overall Financial Resource Strain (CARDIA)    Difficulty of Paying Living Expenses: Not hard at all  Food Insecurity: No Food Insecurity (05/29/2022)   Hunger Vital Sign    Worried About Running Out of Food in the Last Year: Never true    Ran Out of Food in the Last Year: Never true  Transportation Needs: No Transportation Needs (05/29/2022)   PRAPARE - Administrator, Civil Service (Medical): No    Lack of Transportation (Non-Medical): No  Physical Activity: Insufficiently Active (05/29/2022)   Exercise Vital Sign    Days of Exercise per Week: 2 days    Minutes of Exercise per Session: 20 min  Stress: No Stress Concern Present (05/29/2022)   Rachel Macias of Occupational Health - Occupational Stress Questionnaire    Feeling of Stress : Not at all  Social Connections: Socially Isolated (05/29/2022)   Social Connection and Isolation Panel [NHANES]    Frequency of Communication with Friends and Family: More than three times a week    Frequency of Social Gatherings with Friends and Family: Three times a week    Attends Religious Services: Never    Active Member of Clubs or Organizations: No    Attends Banker Meetings: Never    Marital Status: Widowed    Allergies:  Allergies  Allergen Reactions   Alendronate Sodium Other (See Comments)    GI distress GI distress GI distress   Amoxicillin Hives    Metabolic Disorder Labs: Lab Results  Component Value Date   HGBA1C 5.0 01/13/2018   Lab Results  Component Value Date   PROLACTIN 7.7 12/17/2020   PROLACTIN 11.7 01/13/2018   Lab Results  Component Value Date   CHOL 187 06/02/2022   TRIG 206.0 (H) 06/02/2022   HDL 55.30 06/02/2022   CHOLHDL 3 06/02/2022   VLDL 41.2 (H) 06/02/2022   LDLCALC 82 05/21/2021   LDLCALC 97 05/08/2020   Lab Results  Component Value Date   TSH 1.80 06/02/2022   TSH 1.76 05/21/2021    Therapeutic  Level Labs: Lab Results  Component Value Date   LITHIUM 1.40 (H) 08/15/2016   LITHIUM 2.10 (HH) 08/15/2016   Lab Results  Component Value Date   VALPROATE 87 11/19/2022   VALPROATE 76 2021/11/29   No results found for: "CBMZ"  Current Medications: Current Outpatient Medications  Medication Sig Dispense Refill   cholecalciferol (VITAMIN D) 1000 UNITS tablet Take 2 tablets (2,000 Units total) by mouth daily.     divalproex (DEPAKOTE ER) 500 MG 24 hr tablet TAKE 2 TABLETS BY MOUTH AT  BEDTIME 180 tablet 3   divalproex (DEPAKOTE ER) 500 MG 24 hr tablet Take 2 tablets (1,000 mg total) by mouth daily for 10 days. 20 tablet 0   QUEtiapine (SEROQUEL) 200 MG tablet Take 1 tablet (200 mg total) by mouth at bedtime. 90 tablet 3   simvastatin (ZOCOR) 20 MG tablet TAKE 1 TABLET BY MOUTH IN THE  EVENING 90 tablet 3   No current facility-administered medications for this visit.     Musculoskeletal: Strength & Muscle Tone: within normal limits Gait & Station: normal Patient leans: N/A  Psychiatric Specialty Exam: Review of Systems  Psychiatric/Behavioral:  The patient is nervous/anxious.     Blood pressure 138/85, pulse 94, temperature (!) 96.1 F (35.6 C), temperature source Skin, height 5\' 4"  (1.626 m), weight 159 lb 9.6 oz (72.4 kg).Body mass index is 27.4 kg/m.  General Appearance: Fairly Groomed  Eye Contact:  Fair  Speech:  Clear and Coherent  Volume:  Normal  Mood:  Anxious  Affect:  Congruent  Thought Process:  Goal Directed and Descriptions of Associations: Intact  Orientation:  Full (Time, Place, and Person)  Thought Content: Logical   Suicidal Thoughts:  No  Homicidal Thoughts:  No  Memory:  Immediate;   Fair Recent;   Fair Remote;   Fair  Judgement:  Fair  Insight:  Fair  Psychomotor Activity:  Normal  Concentration:  Concentration: Fair and Attention Span: Fair  Recall:  [employer] of Knowledge: Fair  Language: Fair  Akathisia:  No  Handed:  Right  AIMS (if  indicated):  done  Assets:  Communication Skills Desire for Improvement Housing Social Support  ADL's:  Intact  Cognition: WNL  Sleep:  Fair   Screenings: Geneticist, molecular Office Visit from 11/19/2022 in [facility] Office Visit from 06/18/2022 in [facility] Office Visit from 11/24/2021 in [facility] Office Visit from 07/23/2021 in [facility] Office Visit from 12/17/2020 in [facility]  AIMS Total Score 0 0 0 0 0      GAD-7    Flowsheet Row Office Visit from 06/18/2022 in [facility] Office Visit from 06/02/2022 in [facility] [employer] Visit from 11/24/2021 in [facility] Office Visit from 05/21/2021 in [facility] [employer]  Total GAD-7 Score 0 0 0 0      PHQ2-9    Flowsheet Row Office Visit from 11/19/2022 in [facility] Office Visit from 06/18/2022 in [facility] Office Visit from 06/02/2022 in [facility] Clinical Support from 01/22/2022 in [facility] [employer] Visit from 11/24/2021 in [facility]  PHQ-2 Total Score 0 0 0 0 0  PHQ-9 Total Score -- 0 0 -- --      Flowsheet Row Office Visit from 11/19/2022 in [facility] Office Visit from 06/18/2022 in [facility] Office Visit from 11/24/2021 in [facility]  C-SSRS RISK  CATEGORY No Risk No Risk No Risk        Assessment and Plan: Verjean Neugebauer is a 77 year old  Caucasian female who has a history of bipolar disorder, history of lithium toxicity, currently with situational anxiety however coping okay, compliant on medications which continues to maintain her mood symptoms.  Plan as noted below.  Plan Bipolar disorder in remission Depakote ER 1000 mg p.o. nightly. Seroquel 200 mg p.o. nightly Depakote level-11/24/2021-76-therapeutic  High risk medication use-will order repeat Depakote level.  Patient to go to [facility] lab.  Discussed CBC with differential including platelet count dated 06/02/2022-within normal limits. TSH-within normal limits CMP-including sodium-140-within normal limits, AST and ALT-within normal limits-dated 06/02/2022.    Consent: Patient/Guardian gives verbal consent for treatment and assignment of benefits for services provided during this visit. Patient/Guardian expressed understanding and agreed to proceed.   Follow-up in clinic in 6 months or sooner if needed.   This note was generated in part or whole with voice recognition software. Voice recognition is usually quite accurate but there are transcription errors that can and very often do occur. I apologize for any typographical errors that were not detected and corrected.    Jomarie Longs, MD 11/19/2022, 3:01 PM

## 2022-12-17 ENCOUNTER — Encounter: Payer: Self-pay | Admitting: Family

## 2022-12-17 DIAGNOSIS — Z23 Encounter for immunization: Secondary | ICD-10-CM | POA: Diagnosis not present

## 2022-12-17 NOTE — Telephone Encounter (Signed)
Documented in pt chart.

## 2023-01-25 ENCOUNTER — Other Ambulatory Visit: Payer: Self-pay | Admitting: Family

## 2023-01-25 DIAGNOSIS — Z1231 Encounter for screening mammogram for malignant neoplasm of breast: Secondary | ICD-10-CM

## 2023-03-04 ENCOUNTER — Ambulatory Visit
Admission: RE | Admit: 2023-03-04 | Discharge: 2023-03-04 | Disposition: A | Discharge status: Home / Self Care | Payer: Medicare Other | Source: Ambulatory Visit | Attending: Family | Admitting: Family

## 2023-03-04 DIAGNOSIS — Z1231 Encounter for screening mammogram for malignant neoplasm of breast: Secondary | ICD-10-CM | POA: Insufficient documentation

## 2023-03-23 ENCOUNTER — Ambulatory Visit (INDEPENDENT_AMBULATORY_CARE_PROVIDER_SITE_OTHER): Payer: Medicare Other | Admitting: *Deleted

## 2023-03-23 VITALS — Ht 64.0 in | Wt 159.0 lb

## 2023-03-23 DIAGNOSIS — Z0000 Encounter for general adult medical examination without abnormal findings: Secondary | ICD-10-CM | POA: Diagnosis not present

## 2023-03-23 NOTE — Patient Instructions (Signed)
 [name] , Thank you for taking time to come for your Medicare Wellness Visit. I appreciate your ongoing commitment to your health goals. Please review the following plan we discussed and let me know if I can assist you in the future.   Referrals/Orders/Follow-Ups/Clinician Recommendations: Remember to get your Tetanus and call for an eye appointment.  This is a list of the screening recommended for you and due dates:  Health Maintenance  Topic Date Due   DTaP/Tdap/Td vaccine (1 - Tdap) Never done   Zoster (Shingles) Vaccine (1 of 2) Never done   COVID-19 Vaccine (7 - 2024-25 season) 02/11/2023   Mammogram  03/03/2024   Medicare Annual Wellness Visit  03/22/2024   Pneumonia Vaccine  Completed   Flu Shot  Completed   DEXA scan (bone density measurement)  Completed   Hepatitis C Screening  Completed   HPV Vaccine  Aged Out   Cologuard (Stool DNA test)  Discontinued    Advanced directives: (In Chart) A copy of your advanced directives are scanned into your chart should your provider ever need it.  Next Medicare Annual Wellness Visit scheduled for next year: Yes 03/28/24 @ 11:30

## 2023-03-23 NOTE — Progress Notes (Signed)
 Subjective:   Rachel Macias is a 78 y.o. female who presents for Medicare Annual (Subsequent) preventive examination.  Visit Complete: Virtual I connected with  Disha Cottam on 03/23/23 by a audio enabled telemedicine application and verified that I am speaking with the correct person using two identifiers.This patient declined Interactive audio and acupuncturist. Therefore the visit was completed with audio only.   Patient Location: Home  Provider Location: Office/Clinic  I discussed the limitations of evaluation and management by telemedicine. The patient expressed understanding and agreed to proceed.  Vital Signs: Because this visit was a virtual/telehealth visit, some criteria may be missing or patient reported. Any vitals not documented were not able to be obtained and vitals that have been documented are patient reported.  Patient Medicare AWV questionnaire was completed by the patient on 03/17/23; I have confirmed that all information answered by patient is correct and no changes since this date.  Cardiac Risk Factors include: advanced age (>32men, >41 women);dyslipidemia     Objective:    Today's Vitals   03/23/23 1234  Weight: 159 lb (72.1 kg)  Height: 5' 4 (1.626 m)   Body mass index is 27.29 kg/m.     03/23/2023   12:46 PM 01/22/2022   11:44 AM 01/21/2021   10:41 AM 01/19/2020   10:44 AM 01/18/2019   10:46 AM 05/17/2018   10:29 AM 02/08/2018    9:25 AM  Advanced Directives  Does Patient Have a Medical Advance Directive? Yes Yes Yes Yes Yes    Type of Estate Agent of [place];Living will Healthcare Power of [place];Living will Healthcare Power of [place];Living will Healthcare Power of [place];Living will Healthcare Power of [place];Living will    Does patient want to make changes to medical advance directive? No - Patient declined No - Patient declined No - Patient declined No - Patient declined No - Patient declined    Copy of  Healthcare Power of Attorney in Chart? Yes - validated most recent copy scanned in chart (See row information) Yes - validated most recent copy scanned in chart (See row information) Yes - validated most recent copy scanned in chart (See row information) Yes - validated most recent copy scanned in chart (See row information) Yes - validated most recent copy scanned in chart (See row information)    Would patient like information on creating a medical advance directive?            Information is confidential and restricted. Go to Review Flowsheets to unlock data.    Current Medications (verified) Outpatient Encounter Medications as of 03/23/2023  Medication Sig   cholecalciferol (VITAMIN D ) 1000 UNITS tablet Take 2 tablets (2,000 Units total) by mouth daily.   divalproex  (DEPAKOTE  ER) 500 MG 24 hr tablet TAKE 2 TABLETS BY MOUTH AT  BEDTIME   QUEtiapine  (SEROQUEL ) 200 MG tablet Take 1 tablet (200 mg total) by mouth at bedtime.   simvastatin  (ZOCOR ) 20 MG tablet TAKE 1 TABLET BY MOUTH IN THE  EVENING   divalproex  (DEPAKOTE  ER) 500 MG 24 hr tablet Take 2 tablets (1,000 mg total) by mouth daily for 10 days.   No facility-administered encounter medications on file as of 03/23/2023.    Allergies (verified) Alendronate sodium and Amoxicillin   History: Past Medical History:  Diagnosis Date   Bipolar 1 disorder (HCC) 1995   Dr. Viviane Drone [phone])   Ex-smoker 2001   minimal   Osteoporosis 2015   fosamax reaction, then was on boniva IV for ~  1 yr   Postmenopausal    age 8   Vitamin D  deficiency    Past Surgical History:  Procedure Laterality Date   CESAREAN SECTION     DEXA  09/2013   T -2.8 femur   TUBAL LIGATION     with reversal   Family History  Problem Relation Age of Onset   Bipolar disorder Mother    Depression Mother    Anxiety disorder Mother    Diabetes Brother    Decreased libido Brother    Thyroid  disease Other        runs in family   Stroke Neg Hx    CAD Neg  Hx    Cancer Neg Hx    Social History   Socioeconomic History   Marital status: Widowed    Spouse name: Not on file   Number of children: 1   Years of education: Not on file   Highest education level: Some college, no degree  Occupational History    Comment: retired  Tobacco Use   Smoking status: Former    Current packs/day: 0.00    Types: Cigarettes    Quit date: 03/10/1999    Years since quitting: 24.0   Smokeless tobacco: Never  Vaping Use   Vaping status: Never Used  Substance and Sexual Activity   Alcohol use: No   Drug use: No   Sexual activity: Not Currently  Other Topics Concern   Not on file  Social History Narrative   Lives alone with her dog (reported [place] Mar 23, 2024), widow; husband died from brain cancer around 03-12-2010.    Had lived in [place], [place]. Moved to [place] to be closer to family.    son died at age 23yo (hit by car).   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)   Edu: 13 yrs   Activity: no regular exercise   Diet: good water, fruits/vegetables some   Social Drivers of Corporate Investment Banker Strain: Low Risk  (03/23/2023)   Overall Financial Resource Strain (CARDIA)    Difficulty of Paying Living Expenses: Not hard at all  Food Insecurity: No Food Insecurity (03/23/2023)   Hunger Vital Sign    Worried About Running Out of Food in the Last Year: Never true    Ran Out of Food in the Last Year: Never true  Transportation Needs: No Transportation Needs (03/23/2023)   PRAPARE - Administrator, Civil Service (Medical): No    Lack of Transportation (Non-Medical): No  Physical Activity: Insufficiently Active (03/23/2023)   Exercise Vital Sign    Days of Exercise per Week: 2 days    Minutes of Exercise per Session: 10 min  Stress: No Stress Concern Present (03/23/2023)   [employer] of Occupational Health - Occupational Stress Questionnaire    Feeling of Stress : Not  at all  Social Connections: Socially Isolated (03/23/2023)   Social Connection and Isolation Panel [NHANES]    Frequency of Communication with Friends and Family: Twice a week    Frequency of Social Gatherings with Friends and Family: Once a week    Attends Religious Services: Never    Database Administrator or Organizations: No    Attends Banker Meetings: Never    Marital Status: Widowed    Tobacco Counseling Counseling given: Not Answered   Clinical Intake:  Pre-visit preparation completed: Yes  Pain : No/denies pain  BMI - recorded: 27.29 Nutritional Status: BMI 25 -29 Overweight Nutritional Risks: None Diabetes: No  How often do you need to have someone help you when you read instructions, pamphlets, or other written materials from your doctor or pharmacy?: 1 - Never  Interpreter Needed?: No  Information entered by :: R. Shantea Poulton LPN   Activities of Daily Living    03/23/2023   12:37 PM 03/17/2023    8:02 AM  In your present state of health, do you have any difficulty performing the following activities:  Hearing? 0 0  Vision? 0 0  Difficulty concentrating or making decisions? 0 0  Walking or climbing stairs? 0 0  Dressing or bathing? 0 0  Doing errands, shopping? 0 0  Preparing Food and eating ? N N  Using the Toilet? N N  In the past six months, have you accidently leaked urine? N N  Do you have problems with loss of bowel control? N N  Managing your Medications? N N  Managing your Finances? N N  Housekeeping or managing your Housekeeping? N N    Patient Care Team: Dineen Rollene MATSU, FNP as PCP - General (Family Medicine)  Indicate any recent Medical Services you may have received from other than [facility] providers in the past year (date may be approximate).     Assessment:   This is a routine wellness examination for Anslee.  Hearing/Vision screen Hearing Screening - Comments:: No issues Vision Screening - Comments:: Readers    Goals  Addressed             This Visit's Progress    Patient Stated       Wants to exercise more       Depression Screen    03/23/2023   12:40 PM 11/19/2022   11:17 AM 06/18/2022    2:14 PM 06/02/2022   10:35 AM 01/22/2022   11:37 AM 11/24/2021    9:55 AM 07/23/2021    1:33 PM  PHQ 2/9 Scores  PHQ - 2 Score 0   0 0    PHQ- 9 Score 0   0        Information is confidential and restricted. Go to Review Flowsheets to unlock data.    Fall Risk    03/23/2023   12:38 PM 03/17/2023    8:02 AM 06/02/2022   10:34 AM 01/22/2022   11:37 AM 05/21/2021    2:52 PM  Fall Risk   Falls in the past year? 0 0 0 0 0  Number falls in past yr: 0 0 0 0 0  Injury with Fall? 0 0 0 0 0  Risk for fall due to : No Fall Risks  No Fall Risks  No Fall Risks  Follow up Falls prevention discussed;Falls evaluation completed  Falls evaluation completed Falls evaluation completed;Falls prevention discussed Falls evaluation completed    MEDICARE RISK AT HOME: Medicare Risk at Home Any stairs in or around the home?: (Patient-Rptd) Yes If so, are there any without handrails?: (Patient-Rptd) No Home free of loose throw rugs in walkways, pet beds, electrical cords, etc?: (Patient-Rptd) Yes Adequate lighting in your home to reduce risk of falls?: (Patient-Rptd) Yes Life alert?: (Patient-Rptd) No Use of a cane, walker or w/c?: (Patient-Rptd) No Grab bars in the bathroom?: (Patient-Rptd) No Shower chair or bench in shower?: (Patient-Rptd) Yes Elevated toilet seat or a handicapped toilet?: (Patient-Rptd) Yes   Cognitive Function:    01/04/2017    2:26 PM  MMSE - Mini Mental  State Exam  Not completed: Refused        03/23/2023   12:46 PM 01/22/2022   12:06 PM 01/21/2021   10:56 AM 01/18/2019   10:53 AM 01/12/2018    2:56 PM  6CIT Screen  What Year? 0 points 0 points 0 points 0 points 0 points  What month? 0 points 0 points 0 points 0 points 0 points  What time? 0 points 0 points 0 points 0 points 0 points   Count back from 20 0 points 0 points 0 points 0 points 0 points  Months in reverse 0 points 0 points 0 points 0 points 0 points  Repeat phrase 0 points 0 points 2 points 0 points 0 points  Total Score 0 points 0 points 2 points 0 points 0 points    Immunizations Immunization History  Administered Date(s) Administered   Fluad Quad(high Dose 65+) 12/08/2021   Influenza, High Dose Seasonal PF 12/12/2015, 01/04/2017, 01/12/2018, 12/19/2018, 12/18/2020   Influenza,inj,Quad PF,6+ Mos 12/08/2021   Influenza-Unspecified 01/07/2013, 12/08/2019, 12/17/2022   PFIZER(Purple Top)SARS-COV-2 Vaccination 05/10/2019, 05/31/2019, 12/08/2019, 12/18/2020   Pfizer Covid-19 Vaccine Bivalent Booster 52yrs & up 12/18/2020, 12/08/2021   Pfizer Fall 2023 Covid-19 Vaccine 6mos thru 8yrs  12/08/2021   Pfizer(Comirnaty)Fall Seasonal Vaccine 12 years and older 12/17/2022   Pneumococcal Conjugate-13 08/22/2013   Pneumococcal Polysaccharide-23 09/14/2014    TDAP status: Due, Education has been provided regarding the importance of this vaccine. Advised may receive this vaccine at local pharmacy or Health Dept. Aware to provide a copy of the vaccination record if obtained from local pharmacy or Health Dept. Verbalized acceptance and understanding.  Flu Vaccine status: Up to date  Pneumococcal vaccine status: Up to date  Covid-19 vaccine status: Completed vaccines  Qualifies for Shingles Vaccine? Yes   Zostavax completed No   Shingrix Completed?: No.    Education has been provided regarding the importance of this vaccine. Patient has been advised to call insurance company to determine out of pocket expense if they have not yet received this vaccine. Advised may also receive vaccine at local pharmacy or Health Dept. Verbalized acceptance and understanding.  Screening Tests Health Maintenance  Topic Date Due   DTaP/Tdap/Td (1 - Tdap) Never done   Zoster Vaccines- Shingrix (1 of 2) Never done   Medicare Annual  Wellness (AWV)  01/23/2023   COVID-19 Vaccine (7 - 2024-25 season) 02/11/2023   Pneumonia Vaccine 57+ Years old  Completed   INFLUENZA VACCINE  Completed   DEXA SCAN  Completed   Hepatitis C Screening  Completed   HPV VACCINES  Aged Out   Fecal DNA (Cologuard)  Discontinued    Health Maintenance  Health Maintenance Due  Topic Date Due   DTaP/Tdap/Td (1 - Tdap) Never done   Zoster Vaccines- Shingrix (1 of 2) Never done   Medicare Annual Wellness (AWV)  01/23/2023   COVID-19 Vaccine (7 - 2024-25 season) 02/11/2023    Colorectal cancer screening: Type of screening: Cologuard. Completed 01/2022. Repeat every 3 years  Mammogram status: Completed 02/2023. Repeat every year  Bone Density status: Completed 02/2018. Results reflect: Bone density results: OSTEOPOROSIS. Repeat every 2 years. Patient declines  Lung Cancer Screening: (Low Dose CT Chest recommended if Age 43-80 years, 20 pack-year currently smoking OR have quit w/in 15years.) does not qualify.     Additional Screening:  Hepatitis C Screening: does qualify; Completed 12/2015  Vision Screening: Recommended annual ophthalmology exams for early detection of glaucoma and other disorders of the eye. Is  the patient up to date with their annual eye exam?  No  Who is the provider or what is the name of the office in which the patient attends annual eye exams? Patient will call and schedule an appointment. Patient was provided some names of eye doctors If pt is not established with a provider, would they like to be referred to a provider to establish care? No .   Dental Screening: Recommended annual dental exams for proper oral hygiene    Community Resource Referral / Chronic Care Management: CRR required this visit?  No   CCM required this visit?  No     Plan:     I have personally reviewed and noted the following in the patient's chart:   Medical and social history Use of alcohol, tobacco or illicit drugs  Current  medications and supplements including opioid prescriptions. Patient is not currently taking opioid prescriptions. Functional ability and status Nutritional status Physical activity Advanced directives List of other physicians Hospitalizations, surgeries, and ER visits in previous 12 months Vitals Screenings to include cognitive, depression, and falls Referrals and appointments  In addition, I have reviewed and discussed with patient certain preventive protocols, quality metrics, and best practice recommendations. A written personalized care plan for preventive services as well as general preventive health recommendations were provided to patient.     Angeline Fredericks, LPN   [id number]   After Visit Summary: (MyChart) Due to this being a telephonic visit, the after visit summary with patients personalized plan was offered to patient via MyChart   Nurse Notes: none

## 2023-03-26 ENCOUNTER — Encounter: Payer: Self-pay | Admitting: Family

## 2023-04-16 ENCOUNTER — Other Ambulatory Visit: Payer: Self-pay | Admitting: Psychiatry

## 2023-04-16 DIAGNOSIS — F3110 Bipolar disorder, current episode manic without psychotic features, unspecified: Secondary | ICD-10-CM

## 2023-05-20 ENCOUNTER — Other Ambulatory Visit: Payer: Self-pay

## 2023-05-20 ENCOUNTER — Encounter: Payer: Self-pay | Admitting: Psychiatry

## 2023-05-20 ENCOUNTER — Ambulatory Visit (INDEPENDENT_AMBULATORY_CARE_PROVIDER_SITE_OTHER): Payer: Medicare Other | Admitting: Psychiatry

## 2023-05-20 VITALS — BP 133/81 | HR 108 | Temp 96.1°F | Ht 64.0 in | Wt 162.6 lb

## 2023-05-20 DIAGNOSIS — F3174 Bipolar disorder, in full remission, most recent episode manic: Secondary | ICD-10-CM | POA: Diagnosis not present

## 2023-05-20 NOTE — Progress Notes (Unsigned)
 BH MD OP Progress Note  05/20/2023 10:04 AM Rachel Macias  MRN:  [id number]  Chief Complaint:  Chief Complaint  Patient presents with   Follow-up   Depression   Anxiety   Medication Refill   HPI: Rachel Macias is a 78 year old Caucasian female, widowed, lives in [place], has a history of bipolar disorder, insomnia, hyperlipidemia was evaluated in office today.  She has a history of bipolar disorder, which has been in remission for a long time. Her stability is attributed to her current medication regimen, which includes Seroquel 200 mg and Depakote 1000 mg. No significant mood symptoms are present, and her medication has maintained her stability. Her Depakote level was 87 in September, within the normal range.  Her blood pressure readings at home are typically around 125/83 mmHg, but she tends to have higher readings when measured in the office, around 133/81 mmHg. This discrepancy has been consistent when visiting this office, but not with her other doctor. She takes a statin 20 mg and vitamin D in addition to her psychiatric medications. No side effects from her medications, including muscle spasms or rigidity, are reported.  She is actively involved in caring for her dog, who has health issues, a commitment that is significant to her since her husband's passing. She is looking forward to her 60th high school reunion in September and participates in a book club with friends and family, which she enjoys. She has been downsizing her possessions, donating books and other items to various organizations.  She has not had an eye exam in a long time and fears potential findings, such as cataracts.   She denies any other concerns today.  Visit Diagnosis:    ICD-10-CM   1. Bipolar disorder, in full remission, most recent episode manic (HCC)  F31.74       Past Psychiatric History: I have reviewed past psychiatric history from progress note on 06/10/2017.  Past trials of Prozac, lithium,  Zyprexa.  Past Medical History:  Past Medical History:  Diagnosis Date   Bipolar 1 disorder (HCC) 1995   Dr. Caryn Section [phone])   Ex-smoker 2001   minimal   Osteoporosis 2015   fosamax reaction, then was on boniva IV for ~1 yr   Postmenopausal    age 55   Vitamin D deficiency     Past Surgical History:  Procedure Laterality Date   CESAREAN SECTION     DEXA  09/2013   T -2.8 femur   TUBAL LIGATION     with reversal    Family Psychiatric History: I have reviewed family psychiatric history from progress note on 06/10/2017.  Family History:  Family History  Problem Relation Age of Onset   Bipolar disorder Mother    Depression Mother    Anxiety disorder Mother    Diabetes Brother    Decreased libido Brother    Thyroid disease Other        runs in family   Stroke Neg Hx    CAD Neg Hx    Cancer Neg Hx     Social History: I have reviewed social history from progress note on 06/10/2017. Social History   Socioeconomic History   Marital status: Widowed    Spouse name: Not on file   Number of children: 1   Years of education: Not on file   Highest education level: Some college, no degree  Occupational History    Comment: retired  Tobacco Use   Smoking status: Former    Current packs/day:  0.00    Types: Cigarettes    Quit date: 03/10/1999    Years since quitting: 24.2   Smokeless tobacco: Never  Vaping Use   Vaping status: Never Used  Substance and Sexual Activity   Alcohol use: No   Drug use: No   Sexual activity: Not Currently  Other Topics Concern   Not on file  Social History Narrative   Lives alone with her dog (reported Rachel Macias 06-20-23), widow; husband died from brain cancer around 06-18-09.    Had lived in [place], [place]. Moved to [place] to be closer to family.    son died at age 31yo (hit by car).   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)   Edu: 13 yrs   Activity: no regular  exercise   Diet: good water, fruits/vegetables some   Social Drivers of Corporate investment banker Strain: Low Risk  (03/23/2023)   Overall Financial Resource Strain (CARDIA)    Difficulty of Paying Living Expenses: Not hard at all  Food Insecurity: No Food Insecurity (03/23/2023)   Hunger Vital Sign    Worried About Running Out of Food in the Last Year: Never true    Ran Out of Food in the Last Year: Never true  Transportation Needs: No Transportation Needs (03/23/2023)   PRAPARE - Administrator, Civil Service (Medical): No    Lack of Transportation (Non-Medical): No  Physical Activity: Insufficiently Active (03/23/2023)   Exercise Vital Sign    Days of Exercise per Week: 2 days    Minutes of Exercise per Session: 10 min  Stress: No Stress Concern Present (03/23/2023)   [employer] of Occupational Health - Occupational Stress Questionnaire    Feeling of Stress : Not at all  Social Connections: Socially Isolated (03/23/2023)   Social Connection and Isolation Panel [NHANES]    Frequency of Communication with Friends and Family: Twice a week    Frequency of Social Gatherings with Friends and Family: Once a week    Attends Religious Services: Never    Database administrator or Organizations: No    Attends Banker Meetings: Never    Marital Status: Widowed    Allergies:  Allergies  Allergen Reactions   Alendronate Sodium Other (See Comments)    GI distress GI distress GI distress   Amoxicillin Hives    Metabolic Disorder Labs: Lab Results  Component Value Date   HGBA1C 5.0 01/13/2018   Lab Results  Component Value Date   PROLACTIN 7.7 12/17/2020   PROLACTIN 11.7 01/13/2018   Lab Results  Component Value Date   CHOL 187 06/02/2022   TRIG 206.0 (H) 06/02/2022   HDL 55.30 06/02/2022   CHOLHDL 3 06/02/2022   VLDL 41.2 (H) 06/02/2022   LDLCALC 82 05/21/2021   LDLCALC 97 05/08/2020   Lab Results  Component Value Date   TSH 1.80  06/02/2022   TSH 1.76 05/21/2021    Therapeutic Level Labs: Lab Results  Component Value Date   LITHIUM 1.40 (H) 08/15/2016   LITHIUM 2.10 (HH) 08/15/2016   Lab Results  Component Value Date   VALPROATE 87 11/19/2022   VALPROATE 76 11/24/2021   No results found for: "CBMZ"  Current Medications: Current Outpatient Medications  Medication Sig Dispense Refill   cholecalciferol (VITAMIN D) 1000 UNITS tablet Take 2 tablets (2,000 Units total) by mouth daily.     divalproex (DEPAKOTE ER)  500 MG 24 hr tablet TAKE 2 TABLETS BY MOUTH AT  BEDTIME 180 tablet 3   QUEtiapine (SEROQUEL) 200 MG tablet TAKE 1 TABLET BY MOUTH AT  BEDTIME 90 tablet 3   simvastatin (ZOCOR) 20 MG tablet TAKE 1 TABLET BY MOUTH IN THE  EVENING 90 tablet 3   divalproex (DEPAKOTE ER) 500 MG 24 hr tablet Take 2 tablets (1,000 mg total) by mouth daily for 10 days. 20 tablet 0   No current facility-administered medications for this visit.     Musculoskeletal: Strength & Muscle Tone: within normal limits Gait & Station: normal Patient leans: N/A  Psychiatric Specialty Exam: Review of Systems  Psychiatric/Behavioral: Negative.      Blood pressure 133/81, pulse (!) 108, temperature (!) 96.1 F (35.6 C), temperature source Skin, height 5\' 4"  (1.626 m), weight 162 lb 9.6 oz (73.8 kg).Body mass index is 27.91 kg/m.  General Appearance: Casual  Eye Contact:  Fair  Speech:  Normal Rate  Volume:  Normal  Mood:  Euthymic  Affect:  Congruent  Thought Process:  Goal Directed and Descriptions of Associations: Intact  Orientation:  Full (Time, Place, and Person)  Thought Content: Logical   Suicidal Thoughts:  No  Homicidal Thoughts:  No  Memory:  Immediate;   Fair Recent;   Fair Remote;   Fair  Judgement:  Fair  Insight:  Fair  Psychomotor Activity:  Normal  Concentration:  Concentration: Fair and Attention Span: Fair  Recall:  [employer] of Knowledge: Fair  Language: Fair  Akathisia:  No  Handed:  Right   AIMS (if indicated): done  Assets:  Desire for Improvement Housing Social Support Transportation  ADL's:  Intact  Cognition: WNL  Sleep:  Fair   Screenings: Midwife Visit from 05/20/2023 in [facility] Office Visit from 11/19/2022 in [facility] Office Visit from 06/18/2022 in [facility] Office Visit from 11/24/2021 in [facility] Office Visit from 07/23/2021 in [facility]  AIMS Total Score 0 0 0 0 0      GAD-7    Flowsheet Row Office Visit from 06/18/2022 in [facility] Office Visit from 06/02/2022 in [facility] Visit from 11/24/2021 in [facility] Office Visit from 05/21/2021 in [facility] [employer] [facility]  Total GAD-7 Score 0 0 0 0      PHQ2-9    Flowsheet Row Clinical Support from 03/23/2023 in [facility] Visit from 11/19/2022 in [facility] Office Visit from 06/18/2022 in [facility] Office Visit from 06/02/2022 in [facility] Clinical Support from 01/22/2022 in [facility] [employer]  PHQ-2 Total Score 0 0 0 0 0  PHQ-9 Total Score 0 -- 0 0 --      Flowsheet Row Office Visit from 05/20/2023 in [facility] Office Visit from 11/19/2022 in [facility] Office Visit from 06/18/2022 in [facility]  C-SSRS RISK CATEGORY No Risk No Risk No Risk        Assessment and Plan: Ali Mclaurin is a  78 year old Caucasian female who has a history of bipolar disorder, history of lithium  toxicity, currently stable on current medication regimen.  Discussed assessment and plan as noted below.  Bipolar Disorder Bipolar disorder is in remission with no mood symptoms. Well-managed on Seroquel and Depakote, with adherence to medication and activities. Depakote level was 87 in September, within normal range. No side effects reported from Seroquel or Depakote. - Continue Seroquel 200 mg daily at bedtime - Continue Depakote 1000 mg daily at bedtime. ( Depakote level - 87, 11/19/22) - Order labs in September during the next visit.  Reviewed and discussed labs including Depakote level dated 11/19/2022-87-therapeutic Reviewed and discussed TSH-06/02/2018 24-1.80-normal, CBC with differential-within normal limits, vitamin D-33.68-normal, lipid panel-abnormal-triglycerides elevated, VLDL elevated otherwise within normal limits.  Follow-up Annual physical with primary care provider scheduled for March 25th. Next psychiatric follow-up planned for September 2025. - Schedule next psychiatric follow-up in September 2025.  Collaboration of Care: Collaboration of Care: Other patient encouraged to follow up with primary care provider as scheduled.  Patient/Guardian was advised Release of Information must be obtained prior to any record release in order to collaborate their care with an outside provider. Patient/Guardian was advised if they have not already done so to contact the registration department to sign all necessary forms in order for [place] to release information regarding their care.  Consent: Patient/Guardian gives verbal consent for treatment and assignment of benefits for services provided during this visit. Patient/Guardian expressed understanding and agreed to proceed.  Discussed the use of a AI scribe software for clinical note transcription with the patient, who gave verbal consent to proceed.  This note  was generated in part or whole with voice recognition software. Voice recognition is usually quite accurate but there are transcription errors that can and very often do occur. I apologize for any typographical errors that were not detected and corrected.     Jomarie Longs, MD 05/21/2023, 5:29 AM

## 2023-06-01 ENCOUNTER — Ambulatory Visit (INDEPENDENT_AMBULATORY_CARE_PROVIDER_SITE_OTHER): Payer: Medicare Other | Admitting: Family

## 2023-06-01 ENCOUNTER — Encounter: Payer: Self-pay | Admitting: Family

## 2023-06-01 VITALS — BP 130/76 | HR 91 | Temp 97.9°F | Ht 64.0 in | Wt 160.8 lb

## 2023-06-01 DIAGNOSIS — Z780 Asymptomatic menopausal state: Secondary | ICD-10-CM

## 2023-06-01 DIAGNOSIS — F3174 Bipolar disorder, in full remission, most recent episode manic: Secondary | ICD-10-CM

## 2023-06-01 DIAGNOSIS — E785 Hyperlipidemia, unspecified: Secondary | ICD-10-CM

## 2023-06-01 DIAGNOSIS — F319 Bipolar disorder, unspecified: Secondary | ICD-10-CM

## 2023-06-01 DIAGNOSIS — Z0000 Encounter for general adult medical examination without abnormal findings: Secondary | ICD-10-CM

## 2023-06-01 DIAGNOSIS — Z13220 Encounter for screening for lipoid disorders: Secondary | ICD-10-CM

## 2023-06-01 DIAGNOSIS — Z136 Encounter for screening for cardiovascular disorders: Secondary | ICD-10-CM

## 2023-06-01 DIAGNOSIS — E559 Vitamin D deficiency, unspecified: Secondary | ICD-10-CM

## 2023-06-01 LAB — CBC WITH DIFFERENTIAL/PLATELET
Basophils Absolute: 0 10*3/uL (ref 0.0–0.1)
Basophils Relative: 0.3 % (ref 0.0–3.0)
Eosinophils Absolute: 0.1 10*3/uL (ref 0.0–0.7)
Eosinophils Relative: 1.3 % (ref 0.0–5.0)
HCT: 42.2 % (ref 36.0–46.0)
Hemoglobin: 14.2 g/dL (ref 12.0–15.0)
Lymphocytes Relative: 43.1 % (ref 12.0–46.0)
Lymphs Abs: 2.5 10*3/uL (ref 0.7–4.0)
MCHC: 33.7 g/dL (ref 30.0–36.0)
MCV: 100.6 fl — ABNORMAL HIGH (ref 78.0–100.0)
Monocytes Absolute: 0.5 10*3/uL (ref 0.1–1.0)
Monocytes Relative: 8.7 % (ref 3.0–12.0)
Neutro Abs: 2.7 10*3/uL (ref 1.4–7.7)
Neutrophils Relative %: 46.6 % (ref 43.0–77.0)
Platelets: 219 10*3/uL (ref 150.0–400.0)
RBC: 4.19 Mil/uL (ref 3.87–5.11)
RDW: 12.4 % (ref 11.5–15.5)
WBC: 5.8 10*3/uL (ref 4.0–10.5)

## 2023-06-01 LAB — LIPID PANEL
Cholesterol: 166 mg/dL (ref 0–200)
HDL: 52.5 mg/dL (ref >39.00)
LDL Cholesterol: 74 mg/dL (ref 0–99)
NonHDL: 113.98
Total CHOL/HDL Ratio: 3
Triglycerides: 198 mg/dL — ABNORMAL HIGH (ref 0.0–149.0)
VLDL: 39.6 mg/dL (ref 0.0–40.0)

## 2023-06-01 LAB — COMPREHENSIVE METABOLIC PANEL
ALT: 14 U/L (ref 0–35)
AST: 22 U/L (ref 0–37)
Albumin: 4.3 g/dL (ref 3.5–5.2)
Alkaline Phosphatase: 49 U/L (ref 39–117)
BUN: 11 mg/dL (ref 6–23)
CO2: 31 meq/L (ref 19–32)
Calcium: 9.7 mg/dL (ref 8.4–10.5)
Chloride: 104 meq/L (ref 96–112)
Creatinine, Ser: 1.07 mg/dL (ref 0.40–1.20)
GFR: 50.02 mL/min — ABNORMAL LOW (ref >60.00)
Glucose, Bld: 96 mg/dL (ref 70–99)
Potassium: 4.4 meq/L (ref 3.5–5.1)
Sodium: 143 meq/L (ref 135–145)
Total Bilirubin: 0.4 mg/dL (ref 0.2–1.2)
Total Protein: 7.5 g/dL (ref 6.0–8.3)

## 2023-06-01 LAB — VITAMIN D 25 HYDROXY (VIT D DEFICIENCY, FRACTURES): VITD: 43.52 ng/mL (ref 30.00–100.00)

## 2023-06-01 LAB — TSH: TSH: 1.46 u[IU]/mL (ref 0.35–5.50)

## 2023-06-01 NOTE — Assessment & Plan Note (Addendum)
 Politely declines DEXA as she would not treat osteoporosis.  Deferred clinical breast exam due to patient preference. Deferred pelvic exam in the absence of symptoms.  Screening labs ordered.

## 2023-06-01 NOTE — Patient Instructions (Signed)
 Health Maintenance for Postmenopausal Women Menopause is a normal process in which your ability to get pregnant comes to an end. This process happens slowly over many months or years, usually between the ages of 24 and 62. Menopause is complete when you have missed your menstrual period for 12 months. It is important to talk with your health care provider about some of the most common conditions that affect women after menopause (postmenopausal women). These include heart disease, cancer, and bone loss (osteoporosis). Adopting a healthy lifestyle and getting preventive care can help to promote your health and wellness. The actions you take can also lower your chances of developing some of these common conditions. What are the signs and symptoms of menopause? During menopause, you may have the following symptoms: Hot flashes. These can be moderate or severe. Night sweats. Decrease in sex drive. Mood swings. Headaches. Tiredness (fatigue). Irritability. Memory problems. Problems falling asleep or staying asleep. Talk with your health care provider about treatment options for your symptoms. Do I need hormone replacement therapy? Hormone replacement therapy is effective in treating symptoms that are caused by menopause, such as hot flashes and night sweats. Hormone replacement carries certain risks, especially as you become older. If you are thinking about using estrogen or estrogen with progestin, discuss the benefits and risks with your health care provider. How can I reduce my risk for heart disease and stroke? The risk of heart disease, heart attack, and stroke increases as you age. One of the causes may be a change in the body's hormones during menopause. This can affect how your body uses dietary fats, triglycerides, and cholesterol. Heart attack and stroke are medical emergencies. There are many things that you can do to help prevent heart disease and stroke. Watch your blood pressure High  blood pressure causes heart disease and increases the risk of stroke. This is more likely to develop in people who have high blood pressure readings or are overweight. Have your blood pressure checked: Every 3-5 years if you are 50-75 years of age. Every year if you are 77 years old or older. Eat a healthy diet  Eat a diet that includes plenty of vegetables, fruits, low-fat dairy products, and lean protein. Do not eat a lot of foods that are high in solid fats, added sugars, or sodium. Get regular exercise Get regular exercise. This is one of the most important things you can do for your health. Most adults should: Try to exercise for at least 150 minutes each week. The exercise should increase your heart rate and make you sweat (moderate-intensity exercise). Try to do strengthening exercises at least twice each week. Do these in addition to the moderate-intensity exercise. Spend less time sitting. Even light physical activity can be beneficial. Other tips Work with your health care provider to achieve or maintain a healthy weight. Do not use any products that contain nicotine or tobacco. These products include cigarettes, chewing tobacco, and vaping devices, such as e-cigarettes. If you need help quitting, ask your health care provider. Know your numbers. Ask your health care provider to check your cholesterol and your blood sugar (glucose). Continue to have your blood tested as directed by your health care provider. Do I need screening for cancer? Depending on your health history and family history, you may need to have cancer screenings at different stages of your life. This may include screening for: Breast cancer. Cervical cancer. Lung cancer. Colorectal cancer. What is my risk for osteoporosis? After menopause, you may be  at increased risk for osteoporosis. Osteoporosis is a condition in which bone destruction happens more quickly than new bone creation. To help prevent osteoporosis or  the bone fractures that can happen because of osteoporosis, you may take the following actions: If you are 61-3 years old, get at least 1,000 mg of calcium and at least 600 international units (IU) of vitamin D per day. If you are older than age 61 but younger than age 75, get at least 1,200 mg of calcium and at least 600 international units (IU) of vitamin D per day. If you are older than age 62, get at least 1,200 mg of calcium and at least 800 international units (IU) of vitamin D per day. Smoking and drinking excessive alcohol increase the risk of osteoporosis. Eat foods that are rich in calcium and vitamin D, and do weight-bearing exercises several times each week as directed by your health care provider. How does menopause affect my mental health? Depression may occur at any age, but it is more common as you become older. Common symptoms of depression include: Feeling depressed. Changes in sleep patterns. Changes in appetite or eating patterns. Feeling an overall lack of motivation or enjoyment of activities that you previously enjoyed. Frequent crying spells. Talk with your health care provider if you think that you are experiencing any of these symptoms. General instructions See your health care provider for regular wellness exams and vaccines. This may include: Scheduling regular health, dental, and eye exams. Getting and maintaining your vaccines. These include: Influenza vaccine. Get this vaccine each year before the flu season begins. Pneumonia vaccine. Shingles vaccine. Tetanus, diphtheria, and pertussis (Tdap) booster vaccine. Your health care provider may also recommend other immunizations. Tell your health care provider if you have ever been abused or do not feel safe at home. Summary Menopause is a normal process in which your ability to get pregnant comes to an end. This condition causes hot flashes, night sweats, decreased interest in sex, mood swings, headaches, or lack  of sleep. Treatment for this condition may include hormone replacement therapy. Take actions to keep yourself healthy, including exercising regularly, eating a healthy diet, watching your weight, and checking your blood pressure and blood sugar levels. Get screened for cancer and depression. Make sure that you are up to date with all your vaccines. This information is not intended to replace advice given to you by your health care provider. Make sure you discuss any questions you have with your health care provider. Document Revised: 07/15/2020 Document Reviewed: 07/15/2020 Elsevier Patient Education  2024 [employer].

## 2023-06-01 NOTE — Assessment & Plan Note (Signed)
 Chronic, stable.  Continue following with Dr. Elna Breslow

## 2023-06-01 NOTE — Progress Notes (Signed)
 Assessment & Plan:  Routine physical examination Assessment & Plan: Politely declines DEXA as she would not treat osteoporosis.  Deferred clinical breast exam due to patient preference. Deferred pelvic exam in the absence of symptoms.  Screening labs ordered.   Orders: -     VITAMIN D 25 Hydroxy (Vit-D Deficiency, Fractures) -     CBC with Differential/Platelet -     Comprehensive metabolic panel -     Lipid panel -     TSH  Encounter for lipid screening for cardiovascular disease  Asymptomatic postmenopausal state -     VITAMIN D 25 Hydroxy (Vit-D Deficiency, Fractures)  Bipolar 1 disorder (HCC) -     CBC with Differential/Platelet -     Comprehensive metabolic panel -     TSH  Dyslipidemia -     Lipid panel  Vitamin D deficiency -     VITAMIN D 25 Hydroxy (Vit-D Deficiency, Fractures)  Bipolar disorder, in full remission, most recent episode manic (HCC) Assessment & Plan: Chronic, stable.  Continue following with Dr. Elna Breslow      Return precautions given.   Risks, benefits, and alternatives of the medications and treatment plan prescribed today were discussed, and patient expressed understanding.   Education regarding symptom management and diagnosis given to patient on AVS either electronically or printed.  Return for Complete Physical Exam.  Rennie Plowman, FNP  Subjective:    Patient ID: Rachel Macias, female    DOB: 1945-09-09, 78 y.o.   MRN: [id number]  CC: Rachel Macias is a 78 y.o. female who presents today for physical exam.    HPI: Feels well today.  No new complaints    Continues to follow with Dr Elna Breslow, every 6 months.   Colorectal Cancer Screening: Cologuard negative 02/05/2022  Breast Cancer Screening: Mammogram UTD Cervical Cancer Screening: Longer screening for cervical cancer Bone Health screening/DEXA for 65+:  Due DEXA; history of osteoporosis; she declines  Lung Cancer Screening: Doesn't have 20 year pack year history and age > 38  years yo 78 years         Tetanus - UTD  Exercise: Gets regular exercise working in the garden. .   Alcohol use:  none Smoking/tobacco use: former smoker; quit > 15 yrs ago   Health Maintenance  Topic Date Due   Zoster (Shingles) Vaccine (1 of 2) 09/01/2023*   COVID-19 Vaccine (7 - Pfizer risk 2024-25 season) 06/17/2023   Mammogram  03/03/2024   Medicare Annual Wellness Visit  03/22/2024   DTaP/Tdap/Td vaccine (2 - Td or Tdap) 03/24/2033   Pneumonia Vaccine  Completed   Flu Shot  Completed   DEXA scan (bone density measurement)  Completed   Hepatitis C Screening  Completed   HPV Vaccine  Aged Out   Cologuard (Stool DNA test)  Discontinued  *Topic was postponed. The date shown is not the original due date.    ALLERGIES: Alendronate sodium and Amoxicillin  Current Outpatient Medications on File Prior to Visit  Medication Sig Dispense Refill   cholecalciferol (VITAMIN D) 1000 UNITS tablet Take 2 tablets (2,000 Units total) by mouth daily.     divalproex (DEPAKOTE ER) 500 MG 24 hr tablet TAKE 2 TABLETS BY MOUTH AT  BEDTIME 180 tablet 3   QUEtiapine (SEROQUEL) 200 MG tablet TAKE 1 TABLET BY MOUTH AT  BEDTIME 90 tablet 3   simvastatin (ZOCOR) 20 MG tablet TAKE 1 TABLET BY MOUTH IN THE  EVENING 90 tablet 3   divalproex (DEPAKOTE ER) 500  MG 24 hr tablet Take 2 tablets (1,000 mg total) by mouth daily for 10 days. 20 tablet 0   No current facility-administered medications on file prior to visit.    Review of Systems  Constitutional:  Negative for chills and fever.  Respiratory:  Negative for cough.   Cardiovascular:  Negative for chest pain and palpitations.  Gastrointestinal:  Negative for nausea and vomiting.  Genitourinary:  Negative for pelvic pain.      Objective:    BP 130/76   Pulse 91   Temp 97.9 F (36.6 C) (Oral)   Ht 5\' 4"  (1.626 m)   Wt 160 lb 12.8 oz (72.9 kg)   SpO2 97%   BMI 27.60 kg/m   BP Readings from Last 3 Encounters:  06/01/23 130/76  06/02/22  126/82  05/21/21 130/60   Wt Readings from Last 3 Encounters:  06/01/23 160 lb 12.8 oz (72.9 kg)  03/23/23 159 lb (72.1 kg)  06/02/22 160 lb 12.8 oz (72.9 kg)    Physical Exam Vitals reviewed.  Constitutional:      Appearance: She is well-developed.  Eyes:     Conjunctiva/sclera: Conjunctivae normal.  Neck:     Thyroid: No thyroid mass or thyromegaly.  Cardiovascular:     Rate and Rhythm: Normal rate and regular rhythm.     Pulses: Normal pulses.     Heart sounds: Normal heart sounds.  Pulmonary:     Effort: Pulmonary effort is normal.     Breath sounds: Normal breath sounds. No wheezing, rhonchi or rales.  Lymphadenopathy:     Head:     Right side of head: No submental, submandibular, tonsillar, preauricular, posterior auricular or occipital adenopathy.     Left side of head: No submental, submandibular, tonsillar, preauricular, posterior auricular or occipital adenopathy.     Cervical: No cervical adenopathy.  Skin:    General: Skin is warm and dry.  Neurological:     Mental Status: She is alert.  Psychiatric:        Speech: Speech normal.        Behavior: Behavior normal.        Thought Content: Thought content normal.

## 2023-06-04 ENCOUNTER — Ambulatory Visit: Payer: Medicare Other | Admitting: Family

## 2023-06-11 ENCOUNTER — Other Ambulatory Visit: Payer: Self-pay | Admitting: Family

## 2023-06-11 DIAGNOSIS — E785 Hyperlipidemia, unspecified: Secondary | ICD-10-CM

## 2023-07-03 ENCOUNTER — Encounter: Payer: Self-pay | Admitting: Family

## 2023-08-15 ENCOUNTER — Other Ambulatory Visit: Payer: Self-pay | Admitting: Psychiatry

## 2023-08-15 DIAGNOSIS — F5105 Insomnia due to other mental disorder: Secondary | ICD-10-CM

## 2023-11-24 ENCOUNTER — Ambulatory Visit (INDEPENDENT_AMBULATORY_CARE_PROVIDER_SITE_OTHER): Admitting: Psychiatry

## 2023-11-24 ENCOUNTER — Encounter: Payer: Self-pay | Admitting: Psychiatry

## 2023-11-24 ENCOUNTER — Ambulatory Visit: Payer: Self-pay | Admitting: Psychiatry

## 2023-11-24 ENCOUNTER — Other Ambulatory Visit
Admission: RE | Admit: 2023-11-24 | Discharge: 2023-11-24 | Disposition: A | Discharge status: Home / Self Care | Attending: Psychiatry | Admitting: Psychiatry

## 2023-11-24 ENCOUNTER — Other Ambulatory Visit: Payer: Self-pay

## 2023-11-24 VITALS — BP 142/83 | HR 98 | Temp 97.5°F | Ht 64.0 in | Wt 162.0 lb

## 2023-11-24 DIAGNOSIS — Z79899 Other long term (current) drug therapy: Secondary | ICD-10-CM | POA: Insufficient documentation

## 2023-11-24 DIAGNOSIS — F3174 Bipolar disorder, in full remission, most recent episode manic: Secondary | ICD-10-CM | POA: Insufficient documentation

## 2023-11-24 LAB — HEMOGLOBIN A1C
Hgb A1c MFr Bld: 5.2 % (ref 4.8–5.6)
Mean Plasma Glucose: 102.54 mg/dL

## 2023-11-24 LAB — VALPROIC ACID LEVEL: Valproic Acid Lvl: 84 ug/mL (ref 50–100)

## 2023-11-24 NOTE — Progress Notes (Signed)
 BH  MD OP Progress Note  11/24/2023 1:29 PM Rachel Macias  MRN:  [id number]  Chief Complaint:  Chief Complaint  Patient presents with   Follow-up   Medication Refill   Manic Behavior   Discussed the use of AI scribe software for clinical note transcription with the patient, who gave verbal consent to proceed.  History of Present Illness Rachel Macias is a 78 year old Caucasian female, widowed, lives in [place], has a history of bipolar disorder, insomnia, hyperlipidemia was evaluated in office today for a follow-up appointment.  She reports feeling well and describes her mood as stable. She reports no recent symptoms of mania or depression, and she maintains a consistent daily routine. She remains strongly engaged in activities, and she enjoys her independence and living alone. Supportive friendships, including a friend from the veterinary clinic and a friend from high school with whom she plans to attend her upcoming 60th high school reunion, contribute positively to her well-being.  She describes her sleep as good and credits her current medication regimen. She occasionally wakes up earlier than she desires but manages to fall back asleep. She takes Seroquel  200 mg and Depakote  1000 mg daily, along with vitamin D . She reliably receives her medications through mail order and reports no recent changes to her regimen.  She denies any thoughts of harming herself or others. She reports that her aging dog, who has been her companion since her husband's death, is a significant source of comfort, and she anticipates feeling heartbroken when her dog passes away. She maintains her physical health through yoga, walking, and yard work, and she expresses satisfaction with her current level of activity and social engagement.  She denies any other concerns today.   Visit Diagnosis:    ICD-10-CM   1. Bipolar disorder, in full remission, most recent episode manic (HCC)  F31.74 Valproic acid  level     Hemoglobin A1c    2. High risk medication use  Z79.899 Valproic acid  level    Hemoglobin A1c      Past Psychiatric History: I have reviewed past psychiatric history from progress note on 06/10/2017.  Past trials of Prozac, lithium , Zyprexa .  Past Medical History:  Past Medical History:  Diagnosis Date   Bipolar 1 disorder (HCC) 1995   Dr. Viviane Drone [phone])   Ex-smoker 2001   minimal   Osteoporosis 2015   fosamax reaction, then was on boniva IV for ~1 yr   Postmenopausal    age 25   Vitamin D  deficiency     Past Surgical History:  Procedure Laterality Date   CESAREAN SECTION     DEXA  09/2013   T -2.8 femur   TUBAL LIGATION     with reversal    Family Psychiatric History: I have reviewed family psychiatric history from progress note on 06/10/2017.  Family History:  Family History  Problem Relation Age of Onset   Bipolar disorder Mother    Depression Mother    Anxiety disorder Mother    Diabetes Brother    Decreased libido Brother    Thyroid  disease Other        runs in family   Stroke Neg Hx    CAD Neg Hx    Cancer Neg Hx     Social History: I have reviewed social history from progress note on 06/10/2017. Social History   Socioeconomic History   Marital status: Widowed    Spouse name: Not on file   Number of children: 1   Years  of education: Not on file   Highest education level: Some college, no degree  Occupational History    Comment: retired  Tobacco Use   Smoking status: Former    Current packs/day: 0.00    Types: Cigarettes    Quit date: 03/10/1999    Years since quitting: 24.7   Smokeless tobacco: Never  Vaping Use   Vaping status: Never Used  Substance and Sexual Activity   Alcohol use: No   Drug use: No   Sexual activity: Not Currently  Other Topics Concern   Not on file  Social History Narrative   Lives alone with her dog (reported [place] 2023/12/02), widow; husband died from brain cancer around 12/05/09.    Had lived in [place], [place]. Moved  to [place] to be closer to family.    son died at age 57yo (hit by car).   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)   Edu: 13 yrs   Activity: no regular exercise   Diet: good water, fruits/vegetables some   Social Drivers of Corporate investment banker Strain: Low Risk  (03/23/2023)   Overall Financial Resource Strain (CARDIA)    Difficulty of Paying Living Expenses: Not hard at all  Food Insecurity: No Food Insecurity (03/23/2023)   Hunger Vital Sign    Worried About Running Out of Food in the Last Year: Never true    Ran Out of Food in the Last Year: Never true  Transportation Needs: No Transportation Needs (03/23/2023)   PRAPARE - Administrator, Civil Service (Medical): No    Lack of Transportation (Non-Medical): No  Physical Activity: Insufficiently Active (03/23/2023)   Exercise Vital Sign    Days of Exercise per Week: 2 days    Minutes of Exercise per Session: 10 min  Stress: No Stress Concern Present (03/23/2023)   [employer] of Occupational Health - Occupational Stress Questionnaire    Feeling of Stress : Not at all  Social Connections: Socially Isolated (03/23/2023)   Social Connection and Isolation Panel    Frequency of Communication with Friends and Family: Twice a week    Frequency of Social Gatherings with Friends and Family: Once a week    Attends Religious Services: Never    Database administrator or Organizations: No    Attends Banker Meetings: Never    Marital Status: Widowed    Allergies:  Allergies  Allergen Reactions   Alendronate Sodium Other (See Comments)    GI distress GI distress GI distress   Amoxicillin Hives    Metabolic Disorder Labs: Lab Results  Component Value Date   HGBA1C 5.0 01/13/2018   Lab Results  Component Value Date   PROLACTIN 7.7 12/17/2020   PROLACTIN 11.7 01/13/2018   Lab Results  Component Value Date   CHOL 166  06/01/2023   TRIG 198.0 (H) 06/01/2023   HDL 52.50 06/01/2023   CHOLHDL 3 06/01/2023   VLDL 39.6 06/01/2023   LDLCALC 74 06/01/2023   LDLCALC 82 05/21/2021   Lab Results  Component Value Date   TSH 1.46 06/01/2023   TSH 1.80 06/02/2022    Therapeutic Level Labs: Lab Results  Component Value Date   LITHIUM  1.40 (H) 08/15/2016   LITHIUM  2.10 (HH) 08/15/2016   Lab Results  Component Value Date   VALPROATE 87 11/19/2022   VALPROATE 76 12-01-21   No results found for: CBMZ  Current  Medications: Current Outpatient Medications  Medication Sig Dispense Refill   cholecalciferol (VITAMIN D ) 1000 UNITS tablet Take 2 tablets (2,000 Units total) by mouth daily.     divalproex  (DEPAKOTE  ER) 500 MG 24 hr tablet TAKE 2 TABLETS BY MOUTH AT  BEDTIME 180 tablet 3   QUEtiapine  (SEROQUEL ) 200 MG tablet TAKE 1 TABLET BY MOUTH AT  BEDTIME 90 tablet 3   simvastatin  (ZOCOR ) 20 MG tablet TAKE 1 TABLET BY MOUTH IN THE  EVENING 90 tablet 3   No current facility-administered medications for this visit.     Musculoskeletal: Strength & Muscle Tone: within normal limits Gait & Station: normal Patient leans: N/A  Psychiatric Specialty Exam: Review of Systems  Psychiatric/Behavioral: Negative.      Blood pressure (!) 142/83, pulse 98, temperature (!) 97.5 F (36.4 C), temperature source Temporal, height 5' 4 (1.626 m), weight 162 lb (73.5 kg).Body mass index is 27.81 kg/m.  General Appearance: Casual  Eye Contact:  Fair  Speech:  Clear and Coherent  Volume:  Normal  Mood:  Euthymic  Affect:  Congruent  Thought Process:  Goal Directed and Descriptions of Associations: Intact  Orientation:  Full (Time, Place, and Person)  Thought Content: Logical   Suicidal Thoughts:  No  Homicidal Thoughts:  No  Memory:  Immediate;   Fair Recent;   Fair Remote;   Fair  Judgement:  Fair  Insight:  Fair  Psychomotor Activity:  Normal  Concentration:  Concentration: Fair and Attention Span: Fair   Recall:  [employer] of Knowledge: Fair  Language: Fair  Akathisia:  No  Handed:  Right  AIMS (if indicated): done  Assets:  Communication Skills Desire for Improvement Housing Social Support Transportation  ADL's:  Intact  Cognition: WNL  Sleep:  Fair   Screenings: Geneticist, molecular Office Visit from 11/24/2023 in [facility] Office Visit from 05/20/2023 in [facility] Office Visit from 11/19/2022 in [facility] Office Visit from 06/18/2022 in [facility] Office Visit from 11/24/2021 in [facility]  AIMS Total Score 0 0 0 0 0   GAD-7    Flowsheet Row Office Visit from 11/24/2023 in [facility] Office Visit from 06/01/2023 in [facility] Visit from 06/18/2022 in [facility] Office Visit from 06/02/2022 in [facility] Visit from 11/24/2021 in [facility]  Total GAD-7 Score 0 0 0 0 0   PHQ2-9    Flowsheet Row Office Visit from 11/24/2023 in [facility] Office Visit from 06/01/2023 in [facility] Clinical Support from 03/23/2023 in [facility] Visit from 11/19/2022 in [facility] Office Visit from 06/18/2022 in [facility]  PHQ-2 Total Score 0 0 0 0 0  PHQ-9 Total Score -- 0 0 -- 0   Flowsheet Row Office Visit from 11/24/2023 in [facility] Office Visit from 05/20/2023 in [facility] Office  Visit from 11/19/2022 in [facility]  C-SSRS RISK CATEGORY No Risk No Risk No Risk     Assessment and Plan: Kabria Hetzer is a 78 year old Caucasian female who has  a history of bipolar disorder, history of lithium  toxicity, currently stable on current medication regimen.  Discussed assessment and plan as noted below.  1. Bipolar disorder, in full remission, most recent episode manic (HCC) Currently well-managed on current medication regimen. Continue Seroquel  200 mg at bedtime Continue Depakote  1000 mg daily at bedtime (Depakote  level-87, 11/19/2022)  2. High risk medication use Will order labs-Depakote  level, prolactin level and hemoglobin A1c.  Patient to go to [facility] lab. Reviewed and discussed labs dated 06/01/2023-CMP-within normal limits, GFR stable at 50.02. Platelet count-219.  TSH-within normal limits.  Follow-up Follow-up in clinic in 6 months or sooner if needed.   Consent: Patient/Guardian gives verbal consent for treatment and assignment of benefits for services provided during this visit. Patient/Guardian expressed understanding and agreed to proceed.   This note was generated in part or whole with voice recognition software. Voice recognition is usually quite accurate but there are transcription errors that can and very often do occur. I apologize for any typographical errors that were not detected and corrected.    Zaakirah Kistner, MD 11/24/2023, 1:29 PM

## 2023-11-25 ENCOUNTER — Ambulatory Visit: Admitting: Psychiatry

## 2023-11-25 NOTE — Telephone Encounter (Signed)
 Lab results reviewed. HbA1c is within an acceptable range. Please advise her to continue the current medication regimen.

## 2023-11-29 NOTE — Progress Notes (Signed)
 Called patient to discuss lab results patient stated that she had received the results on MyChart she had no questions or concerns

## 2023-12-27 DIAGNOSIS — Z23 Encounter for immunization: Secondary | ICD-10-CM | POA: Diagnosis not present

## 2024-01-07 DIAGNOSIS — H401234 Low-tension glaucoma, bilateral, indeterminate stage: Secondary | ICD-10-CM | POA: Diagnosis not present

## 2024-01-07 DIAGNOSIS — H2513 Age-related nuclear cataract, bilateral: Secondary | ICD-10-CM | POA: Diagnosis not present

## 2024-01-07 DIAGNOSIS — H43391 Other vitreous opacities, right eye: Secondary | ICD-10-CM | POA: Diagnosis not present

## 2024-01-21 ENCOUNTER — Other Ambulatory Visit: Payer: Self-pay | Admitting: Family

## 2024-01-21 DIAGNOSIS — Z1231 Encounter for screening mammogram for malignant neoplasm of breast: Secondary | ICD-10-CM

## 2024-01-22 ENCOUNTER — Other Ambulatory Visit: Payer: Self-pay | Admitting: Psychiatry

## 2024-01-22 DIAGNOSIS — F3110 Bipolar disorder, current episode manic without psychotic features, unspecified: Secondary | ICD-10-CM

## 2024-02-02 DIAGNOSIS — H401234 Low-tension glaucoma, bilateral, indeterminate stage: Secondary | ICD-10-CM | POA: Diagnosis not present

## 2024-02-02 DIAGNOSIS — H2513 Age-related nuclear cataract, bilateral: Secondary | ICD-10-CM | POA: Diagnosis not present

## 2024-02-02 DIAGNOSIS — H43391 Other vitreous opacities, right eye: Secondary | ICD-10-CM | POA: Diagnosis not present

## 2024-02-17 DIAGNOSIS — H4010X0 Unspecified open-angle glaucoma, stage unspecified: Secondary | ICD-10-CM | POA: Diagnosis not present

## 2024-02-17 DIAGNOSIS — H2513 Age-related nuclear cataract, bilateral: Secondary | ICD-10-CM | POA: Diagnosis not present

## 2024-02-17 DIAGNOSIS — H538 Other visual disturbances: Secondary | ICD-10-CM | POA: Diagnosis not present

## 2024-03-07 ENCOUNTER — Ambulatory Visit
Admission: RE | Admit: 2024-03-07 | Discharge: 2024-03-07 | Disposition: A | Discharge status: Home / Self Care | Source: Ambulatory Visit | Attending: Family | Admitting: Family

## 2024-03-07 DIAGNOSIS — Z1231 Encounter for screening mammogram for malignant neoplasm of breast: Secondary | ICD-10-CM | POA: Insufficient documentation

## 2024-03-28 ENCOUNTER — Telehealth: Payer: Self-pay | Admitting: *Deleted

## 2024-03-28 ENCOUNTER — Ambulatory Visit (INDEPENDENT_AMBULATORY_CARE_PROVIDER_SITE_OTHER): Payer: Medicare Other | Admitting: *Deleted

## 2024-03-28 VITALS — BP 157/89 | Ht 64.0 in | Wt 161.0 lb

## 2024-03-28 DIAGNOSIS — Z0000 Encounter for general adult medical examination without abnormal findings: Secondary | ICD-10-CM

## 2024-03-28 NOTE — Telephone Encounter (Signed)
 Performed AWV  Patient's blood pressure was 162/100 and later during the visit it was 157/89. Patient stated that her blood pressure fluctuates a lot. Patient stated that she feels fine. Patient is wondering if she needs to be on medication for her blood pressure. Advised patient that her blood pressure readings will be sent to her PCP.  Patient stated that she does get anxious when she takes her blood pressure.

## 2024-03-28 NOTE — Telephone Encounter (Signed)
 Spoke to pt she is feeling just fine, she is not having any sxs no SOB, cp, ha, she stated that she has an old bp machine, but she changed the batteries and checked it again it was a little better but still 140/90 pt stated that she was feeling fine no appt or ED needed at this time

## 2024-03-28 NOTE — Patient Instructions (Signed)
 [name],  Thank you for taking the time for your Medicare Wellness Visit. I appreciate your continued commitment to your health goals. Please review the care plan we discussed, and feel free to reach out if I can assist you further.  Please note that Annual Wellness Visits do not include a physical exam. Some assessments may be limited, especially if the visit was conducted virtually. If needed, we may recommend an in-person follow-up with your provider.  Ongoing Care Seeing your primary care provider every 3 to 6 months helps us  monitor your health and provide consistent, personalized care.  Make sure that you monitor your blood pressure.   Referrals If a referral was made during today's visit and you haven't received any updates within two weeks, please contact the referred provider directly to check on the status.  Recommended Screenings:  Health Maintenance  Topic Date Due   Zoster (Shingles) Vaccine (1 of 2) Never done   COVID-19 Vaccine (8 - Pfizer risk 2025-26 season) 06/26/2024   Breast Cancer Screening  03/07/2025   Medicare Annual Wellness Visit  03/28/2025   DTaP/Tdap/Td vaccine (2 - Td or Tdap) 03/24/2033   Pneumococcal Vaccine for age over 57  Completed   Flu Shot  Completed   Osteoporosis screening with Bone Density Scan  Completed   Hepatitis C Screening  Completed   Meningitis B Vaccine  Aged Out   Cologuard (Stool DNA test)  Discontinued       03/28/2024   11:46 AM  Advanced Directives  Does Patient Have a Medical Advance Directive? Yes  Type of Estate Agent of [place];Living will  Does patient want to make changes to medical advance directive? No - Patient declined  Copy of Healthcare Power of Attorney in Chart? Yes - validated most recent copy scanned in chart (See row information)    Vision: Annual vision screenings are recommended for early detection of glaucoma, cataracts, and diabetic retinopathy. These exams can also reveal  signs of chronic conditions such as diabetes and high blood pressure.  Dental: Annual dental screenings help detect early signs of oral cancer, gum disease, and other conditions linked to overall health, including heart disease and diabetes.  Please see the attached documents for additional preventive care recommendations.

## 2024-03-28 NOTE — Progress Notes (Signed)
 "  Chief Complaint  Patient presents with   Medicare Wellness     Subjective:   Rachel Macias is a 79 y.o. female who presents for a Medicare Annual Wellness Visit.  Visit info / Clinical Intake: Persons participating in visit and providing information:: patient Medicare Wellness Visit Mode:: Telephone If telephone:: video declined Since this visit was completed virtually, some vitals may be partially provided or unavailable. Missing vitals are due to the limitations of the virtual format.: Documented vitals are patient reported If Telephone or Video please confirm:: I connected with patient using audio/video enable telemedicine. I verified patient identity with two identifiers, discussed telehealth limitations, and patient agreed to proceed. Patient Location:: Home Provider Location:: Office/Home Interpreter Needed?: No Pre-visit prep was completed: yes AWV questionnaire completed by patient prior to visit?: yes Date:: 03/27/24 Living arrangements:: (!) lives alone Patient's Overall Health Status Rating: good Typical amount of pain: none Does pain affect daily life?: no Are you currently prescribed opioids?: no  Dietary Habits and Nutritional Risks How many meals a day?: 2 Eats fruit and vegetables daily?: yes Most meals are obtained by: preparing own meals In the last 2 weeks, have you had any of the following?: none Diabetic:: no  Functional Status Activities of Daily Living (to include ambulation/medication): Independent Ambulation: Independent Medication Administration: Independent Home Management (perform basic housework or laundry): Independent Manage your own finances?: yes Primary transportation is: driving Concerns about vision?: (!) yes (has cataracts) Concerns about hearing?: no  Fall Screening Falls in the past year?: 0 Number of falls in past year: 0 Was there an injury with Fall?: 0 Fall Risk Category Calculator: 0 Patient Fall Risk Level: Low Fall  Risk  Fall Risk Patient at Risk for Falls Due to: No Fall Risks Fall risk Follow up: Falls evaluation completed; Falls prevention discussed  Home and Transportation Safety: All rugs have non-skid backing?: yes All stairs or steps have railings?: yes Grab bars in the bathtub or shower?: yes Have non-skid surface in bathtub or shower?: yes Good home lighting?: yes Regular seat belt use?: yes Hospital stays in the last year:: no  Cognitive Assessment Difficulty concentrating, remembering, or making decisions? : no Will 6CIT or Mini Cog be Completed: yes What year is it?: 0 points What month is it?: 0 points Give patient an address phrase to remember (5 components): [address] [place] About what time is it?: 0 points Count backwards from 20 to 1: 0 points Say the months of the year in reverse: 0 points Repeat the address phrase from earlier: 0 points 6 CIT Score: 0 points  Advance Directives (For Healthcare) Does Patient Have a Medical Advance Directive?: Yes Does patient want to make changes to medical advance directive?: No - Patient declined Type of Advance Directive: Healthcare Power of [place]; Living will Copy of Healthcare Power of Attorney in Chart?: Yes - validated most recent copy scanned in chart (See row information) Copy of Living Will in Chart?: Yes - validated most recent copy scanned in chart (See row information)  Reviewed/Updated  Reviewed/Updated: Reviewed All (Medical, Surgical, Family, Medications, Allergies, Care Teams, Patient Goals)    Allergies (verified) Alendronate sodium and Amoxicillin   Current Medications (verified) Outpatient Encounter Medications as of 03/28/2024  Medication Sig   cholecalciferol (VITAMIN D ) 1000 UNITS tablet Take 2 tablets (2,000 Units total) by mouth daily.   divalproex  (DEPAKOTE  ER) 500 MG 24 hr tablet TAKE 2 TABLETS BY MOUTH AT  BEDTIME   QUEtiapine  (SEROQUEL ) 200 MG  tablet TAKE 1 TABLET BY MOUTH AT  BEDTIME    simvastatin  (ZOCOR ) 20 MG tablet TAKE 1 TABLET BY MOUTH IN THE  EVENING   No facility-administered encounter medications on file as of 03/28/2024.    History: Past Medical History:  Diagnosis Date   Bipolar 1 disorder (HCC) 1995   Dr. Viviane Drone [phone])   Ex-smoker 2001   minimal   Osteoporosis 2015   fosamax reaction, then was on boniva IV for ~1 yr   Postmenopausal    age 63   Vitamin D  deficiency    Past Surgical History:  Procedure Laterality Date   CESAREAN SECTION     DEXA  09/2013   T -2.8 femur   TUBAL LIGATION     with reversal   Family History  Problem Relation Age of Onset   Bipolar disorder Mother    Depression Mother    Anxiety disorder Mother    Diabetes Brother    Decreased libido Brother    Thyroid  disease Other        runs in family   Stroke Neg Hx    CAD Neg Hx    Cancer Neg Hx    Breast cancer Neg Hx    Social History   Occupational History    Comment: retired  Tobacco Use   Smoking status: Former    Current packs/day: 0.00    Types: Cigarettes    Quit date: 03/10/1999    Years since quitting: 25.0   Smokeless tobacco: Never  Vaping Use   Vaping status: Never Used  Substance and Sexual Activity   Alcohol use: No   Drug use: No   Sexual activity: Not Currently   Tobacco Counseling Counseling given: Not Answered  SDOH Screenings   Food Insecurity: No Food Insecurity (03/27/2024)  Housing: Low Risk (03/27/2024)  Transportation Needs: No Transportation Needs (03/27/2024)  Utilities: Not At Risk (03/28/2024)  Alcohol Screen: Low Risk (03/23/2023)  Depression (PHQ2-9): Low Risk (03/28/2024)  Financial Resource Strain: Low Risk (03/27/2024)  Physical Activity: Insufficiently Active (03/27/2024)  Social Connections: Socially Isolated (03/27/2024)  Stress: No Stress Concern Present (03/27/2024)  Tobacco Use: Medium Risk (03/28/2024)  Health Literacy: Adequate Health Literacy (03/28/2024)   See flowsheets for full screening  details  Depression Screen PHQ 2 & 9 Depression Scale- Over the past 2 weeks, how often have you been bothered by any of the following problems? Little interest or pleasure in doing things: 0 Feeling down, depressed, or hopeless (PHQ Adolescent also includes...irritable): 0 PHQ-2 Total Score: 0 Trouble falling or staying asleep, or sleeping too much: 0 Feeling tired or having little energy: 0 Poor appetite or overeating (PHQ Adolescent also includes...weight loss): 0 Feeling bad about yourself - or that you are a failure or have let yourself or your family down: 0 Trouble concentrating on things, such as reading the newspaper or watching television (PHQ Adolescent also includes...like school work): 0 Moving or speaking so slowly that other people could have noticed. Or the opposite - being so fidgety or restless that you have been moving around a lot more than usual: 0 Thoughts that you would be better off dead, or of hurting yourself in some way: 0 PHQ-9 Total Score: 0 If you checked off any problems, how difficult have these problems made it for you to do your work, take care of things at home, or get along with other people?: Not difficult at all     Goals Addressed  This Visit's Progress    Patient Stated       Wants to exercise              Objective:    Today's Vitals   03/28/24 1138 03/28/24 1212  BP: (!) 162/100 (!) 157/89  Weight: 161 lb (73 kg)   Height: 5' 4 (1.626 m)    Body mass index is 27.64 kg/m.  Hearing/Vision screen Hearing Screening - Comments:: No issues Vision Screening - Comments:: Readers, [facility], up to date Immunizations and Health Maintenance Health Maintenance  Topic Date Due   Zoster Vaccines- Shingrix (1 of 2) Never done   COVID-19 Vaccine (8 - Pfizer risk 2025-26 season) 06/26/2024   Mammogram  03/07/2025   Medicare Annual Wellness (AWV)  03/28/2025   DTaP/Tdap/Td (2 - Td or Tdap) 03/24/2033   Pneumococcal  Vaccine: 50+ Years  Completed   Influenza Vaccine  Completed   Bone Density Scan  Completed   Hepatitis C Screening  Completed   Meningococcal B Vaccine  Aged Out   Fecal DNA (Cologuard)  Discontinued        Assessment/Plan:  This is a routine wellness examination for Rachel Macias.  Patient Care Team: Dineen Rollene MATSU, FNP as PCP - General (Family Medicine) Eappen, Saramma, MD as Consulting Physician (Psychiatry)  I have personally reviewed and noted the following in the patients chart:   Medical and social history Use of alcohol, tobacco or illicit drugs  Current medications and supplements including opioid prescriptions. Functional ability and status Nutritional status Physical activity Advanced directives List of other physicians Hospitalizations, surgeries, and ER visits in previous 12 months Vitals Screenings to include cognitive, depression, and falls Referrals and appointments  No orders of the defined types were placed in this encounter.  In addition, I have reviewed and discussed with patient certain preventive protocols, quality metrics, and best practice recommendations. A written personalized care plan for preventive services as well as general preventive health recommendations were provided to patient.   Angeline Fredericks, LPN   [id number]/79/7973   Return in 1 year (on 03/28/2025).  After Visit Summary: (MyChart) Due to this being a telephonic visit, the after visit summary with patients personalized plan was offered to patient via MyChart   Nurse Notes: Patient declines shingles vaccines. Phone note sent to PCP regarding blood pressure. "

## 2024-03-29 NOTE — Telephone Encounter (Signed)
 Noted Please ensure she knows BP goal < 130/80 She needs to sch appt with me if higher

## 2024-03-29 NOTE — Telephone Encounter (Signed)
 Spoke to pt she verbalizes understanding

## 2024-05-11 ENCOUNTER — Ambulatory Visit: Admitting: Psychiatry

## 2024-06-01 ENCOUNTER — Ambulatory Visit: Admitting: Family

## 2025-04-03 ENCOUNTER — Ambulatory Visit
